# Patient Record
Sex: Male | Born: 1945
Health system: Southern US, Community
[De-identification: ages and names within clinical notes are randomized; demographics above are authoritative.]

## PROBLEM LIST (undated history)

## (undated) DIAGNOSIS — L570 Actinic keratosis: Secondary | ICD-10-CM

## (undated) DIAGNOSIS — M1990 Unspecified osteoarthritis, unspecified site: Secondary | ICD-10-CM

## (undated) DIAGNOSIS — E785 Hyperlipidemia, unspecified: Secondary | ICD-10-CM

## (undated) DIAGNOSIS — I2510 Atherosclerotic heart disease of native coronary artery without angina pectoris: Secondary | ICD-10-CM

## (undated) DIAGNOSIS — Z86018 Personal history of other benign neoplasm: Secondary | ICD-10-CM

## (undated) DIAGNOSIS — N401 Enlarged prostate with lower urinary tract symptoms: Secondary | ICD-10-CM

## (undated) DIAGNOSIS — I10 Essential (primary) hypertension: Secondary | ICD-10-CM

## (undated) DIAGNOSIS — Z85828 Personal history of other malignant neoplasm of skin: Secondary | ICD-10-CM

## (undated) DIAGNOSIS — R972 Elevated prostate specific antigen [PSA]: Secondary | ICD-10-CM

## (undated) DIAGNOSIS — D649 Anemia, unspecified: Secondary | ICD-10-CM

## (undated) DIAGNOSIS — G4733 Obstructive sleep apnea (adult) (pediatric): Secondary | ICD-10-CM

## (undated) DIAGNOSIS — N411 Chronic prostatitis: Secondary | ICD-10-CM

## (undated) DIAGNOSIS — R7302 Impaired glucose tolerance (oral): Secondary | ICD-10-CM

## (undated) DIAGNOSIS — N529 Male erectile dysfunction, unspecified: Secondary | ICD-10-CM

## (undated) DIAGNOSIS — N138 Other obstructive and reflux uropathy: Secondary | ICD-10-CM

## (undated) DIAGNOSIS — IMO0001 Reserved for inherently not codable concepts without codable children: Secondary | ICD-10-CM

## (undated) DIAGNOSIS — G2581 Restless legs syndrome: Secondary | ICD-10-CM

## (undated) HISTORY — DX: Benign prostatic hyperplasia with lower urinary tract symptoms: N40.1

## (undated) HISTORY — PX: CARDIAC SURGERY: SHX584

## (undated) HISTORY — DX: Other obstructive and reflux uropathy: N13.8

## (undated) HISTORY — DX: Personal history of other benign neoplasm: Z86.018

## (undated) HISTORY — PX: INGUINAL HERNIA REPAIR: SUR1180

## (undated) HISTORY — DX: Personal history of other malignant neoplasm of skin: Z85.828

## (undated) HISTORY — DX: Elevated prostate specific antigen (PSA): R97.20

## (undated) HISTORY — DX: Atherosclerotic heart disease of native coronary artery without angina pectoris: I25.10

## (undated) HISTORY — DX: Obstructive sleep apnea (adult) (pediatric): G47.33

## (undated) HISTORY — DX: Impaired glucose tolerance (oral): R73.02

## (undated) HISTORY — PX: TRANSURETHRAL RESECTION OF PROSTATE: SHX73

## (undated) HISTORY — DX: Hyperlipidemia, unspecified: E78.5

## (undated) HISTORY — PX: ADENOIDECTOMY: SUR15

## (undated) HISTORY — DX: Reserved for inherently not codable concepts without codable children: IMO0001

## (undated) HISTORY — DX: Actinic keratosis: L57.0

## (undated) HISTORY — DX: Chronic prostatitis: N41.1

## (undated) HISTORY — DX: Restless legs syndrome: G25.81

## (undated) HISTORY — DX: Anemia, unspecified: D64.9

## (undated) HISTORY — DX: Male erectile dysfunction, unspecified: N52.9

---

## 2000-04-05 DIAGNOSIS — I252 Old myocardial infarction: Secondary | ICD-10-CM | POA: Insufficient documentation

## 2000-04-05 DIAGNOSIS — I219 Acute myocardial infarction, unspecified: Secondary | ICD-10-CM

## 2000-04-05 HISTORY — PX: CORONARY ARTERY BYPASS GRAFT: SHX141

## 2000-04-05 HISTORY — DX: Acute myocardial infarction, unspecified: I21.9

## 2000-11-03 DIAGNOSIS — N411 Chronic prostatitis: Secondary | ICD-10-CM

## 2000-11-03 HISTORY — DX: Chronic prostatitis: N41.1

## 2012-10-18 ENCOUNTER — Ambulatory Visit: Payer: Self-pay

## 2012-10-19 ENCOUNTER — Emergency Department: Payer: Self-pay | Admitting: Emergency Medicine

## 2012-10-19 LAB — URINALYSIS, COMPLETE
Bilirubin,UR: NEGATIVE
Glucose,UR: NEGATIVE mg/dL (ref 0–75)
Nitrite: POSITIVE
RBC,UR: 34 /HPF (ref 0–5)
Specific Gravity: 1.015 (ref 1.003–1.030)
WBC UR: 2 /HPF (ref 0–5)

## 2012-12-01 ENCOUNTER — Emergency Department: Payer: Self-pay | Admitting: Emergency Medicine

## 2012-12-01 LAB — COMPREHENSIVE METABOLIC PANEL
Alkaline Phosphatase: 89 U/L (ref 50–136)
Anion Gap: 10 (ref 7–16)
Bilirubin,Total: 0.4 mg/dL (ref 0.2–1.0)
Calcium, Total: 9.5 mg/dL (ref 8.5–10.1)
Chloride: 99 mmol/L (ref 98–107)
Co2: 25 mmol/L (ref 21–32)
EGFR (Non-African Amer.): 52 — ABNORMAL LOW
Glucose: 164 mg/dL — ABNORMAL HIGH (ref 65–99)
Osmolality: 272 (ref 275–301)
SGOT(AST): 28 U/L (ref 15–37)
Sodium: 134 mmol/L — ABNORMAL LOW (ref 136–145)
Total Protein: 8.1 g/dL (ref 6.4–8.2)

## 2012-12-01 LAB — CBC
MCH: 32.1 pg (ref 26.0–34.0)
Platelet: 203 10*3/uL (ref 150–440)
RDW: 12.6 % (ref 11.5–14.5)

## 2012-12-01 LAB — LIPASE, BLOOD: Lipase: 78 U/L (ref 73–393)

## 2013-04-20 DIAGNOSIS — N400 Enlarged prostate without lower urinary tract symptoms: Secondary | ICD-10-CM | POA: Diagnosis not present

## 2013-04-25 DIAGNOSIS — N400 Enlarged prostate without lower urinary tract symptoms: Secondary | ICD-10-CM | POA: Diagnosis not present

## 2013-05-03 DIAGNOSIS — D239 Other benign neoplasm of skin, unspecified: Secondary | ICD-10-CM | POA: Diagnosis not present

## 2013-05-03 DIAGNOSIS — L570 Actinic keratosis: Secondary | ICD-10-CM | POA: Diagnosis not present

## 2013-05-08 DIAGNOSIS — L570 Actinic keratosis: Secondary | ICD-10-CM | POA: Diagnosis not present

## 2013-05-10 DIAGNOSIS — IMO0002 Reserved for concepts with insufficient information to code with codable children: Secondary | ICD-10-CM | POA: Diagnosis not present

## 2013-05-10 DIAGNOSIS — M545 Low back pain, unspecified: Secondary | ICD-10-CM | POA: Diagnosis not present

## 2013-05-10 DIAGNOSIS — M1710 Unilateral primary osteoarthritis, unspecified knee: Secondary | ICD-10-CM | POA: Diagnosis not present

## 2013-05-30 DIAGNOSIS — H02839 Dermatochalasis of unspecified eye, unspecified eyelid: Secondary | ICD-10-CM | POA: Diagnosis not present

## 2013-05-30 DIAGNOSIS — H2510 Age-related nuclear cataract, unspecified eye: Secondary | ICD-10-CM | POA: Diagnosis not present

## 2013-05-30 DIAGNOSIS — H539 Unspecified visual disturbance: Secondary | ICD-10-CM | POA: Diagnosis not present

## 2013-05-30 DIAGNOSIS — H43399 Other vitreous opacities, unspecified eye: Secondary | ICD-10-CM | POA: Diagnosis not present

## 2013-06-27 DIAGNOSIS — I10 Essential (primary) hypertension: Secondary | ICD-10-CM | POA: Diagnosis not present

## 2013-06-27 DIAGNOSIS — E039 Hypothyroidism, unspecified: Secondary | ICD-10-CM | POA: Diagnosis not present

## 2013-06-27 DIAGNOSIS — E785 Hyperlipidemia, unspecified: Secondary | ICD-10-CM | POA: Diagnosis not present

## 2013-07-04 DIAGNOSIS — I25810 Atherosclerosis of coronary artery bypass graft(s) without angina pectoris: Secondary | ICD-10-CM | POA: Diagnosis not present

## 2013-07-04 DIAGNOSIS — N401 Enlarged prostate with lower urinary tract symptoms: Secondary | ICD-10-CM | POA: Diagnosis not present

## 2013-07-04 DIAGNOSIS — I209 Angina pectoris, unspecified: Secondary | ICD-10-CM | POA: Diagnosis not present

## 2013-07-25 DIAGNOSIS — Z23 Encounter for immunization: Secondary | ICD-10-CM | POA: Diagnosis not present

## 2013-09-07 DIAGNOSIS — N400 Enlarged prostate without lower urinary tract symptoms: Secondary | ICD-10-CM | POA: Diagnosis not present

## 2013-09-12 DIAGNOSIS — N400 Enlarged prostate without lower urinary tract symptoms: Secondary | ICD-10-CM | POA: Diagnosis not present

## 2013-12-04 DIAGNOSIS — D649 Anemia, unspecified: Secondary | ICD-10-CM | POA: Diagnosis not present

## 2013-12-04 DIAGNOSIS — R972 Elevated prostate specific antigen [PSA]: Secondary | ICD-10-CM | POA: Diagnosis not present

## 2013-12-04 DIAGNOSIS — E785 Hyperlipidemia, unspecified: Secondary | ICD-10-CM | POA: Diagnosis not present

## 2013-12-07 DIAGNOSIS — J45909 Unspecified asthma, uncomplicated: Secondary | ICD-10-CM | POA: Diagnosis not present

## 2013-12-07 DIAGNOSIS — I2510 Atherosclerotic heart disease of native coronary artery without angina pectoris: Secondary | ICD-10-CM | POA: Diagnosis not present

## 2013-12-07 DIAGNOSIS — R339 Retention of urine, unspecified: Secondary | ICD-10-CM | POA: Diagnosis not present

## 2013-12-07 DIAGNOSIS — N401 Enlarged prostate with lower urinary tract symptoms: Secondary | ICD-10-CM | POA: Diagnosis not present

## 2013-12-07 DIAGNOSIS — N138 Other obstructive and reflux uropathy: Secondary | ICD-10-CM | POA: Diagnosis not present

## 2014-01-16 DIAGNOSIS — Z23 Encounter for immunization: Secondary | ICD-10-CM | POA: Diagnosis not present

## 2014-01-24 DIAGNOSIS — I519 Heart disease, unspecified: Secondary | ICD-10-CM | POA: Diagnosis not present

## 2014-01-24 DIAGNOSIS — I209 Angina pectoris, unspecified: Secondary | ICD-10-CM | POA: Diagnosis not present

## 2014-01-31 DIAGNOSIS — I208 Other forms of angina pectoris: Secondary | ICD-10-CM | POA: Diagnosis not present

## 2014-03-19 DIAGNOSIS — I2510 Atherosclerotic heart disease of native coronary artery without angina pectoris: Secondary | ICD-10-CM | POA: Diagnosis not present

## 2014-03-19 DIAGNOSIS — N401 Enlarged prostate with lower urinary tract symptoms: Secondary | ICD-10-CM | POA: Diagnosis not present

## 2014-03-19 DIAGNOSIS — I252 Old myocardial infarction: Secondary | ICD-10-CM | POA: Diagnosis not present

## 2014-03-19 DIAGNOSIS — R972 Elevated prostate specific antigen [PSA]: Secondary | ICD-10-CM | POA: Diagnosis not present

## 2014-03-19 DIAGNOSIS — N138 Other obstructive and reflux uropathy: Secondary | ICD-10-CM

## 2014-03-19 DIAGNOSIS — N529 Male erectile dysfunction, unspecified: Secondary | ICD-10-CM

## 2014-03-19 HISTORY — DX: Male erectile dysfunction, unspecified: N52.9

## 2014-03-19 HISTORY — DX: Other obstructive and reflux uropathy: N13.8

## 2014-03-19 HISTORY — DX: Other obstructive and reflux uropathy: N40.1

## 2014-03-19 HISTORY — DX: Elevated prostate specific antigen (PSA): R97.20

## 2014-05-22 DIAGNOSIS — Z1283 Encounter for screening for malignant neoplasm of skin: Secondary | ICD-10-CM | POA: Diagnosis not present

## 2014-05-22 DIAGNOSIS — D1800 Hemangioma unspecified site: Secondary | ICD-10-CM | POA: Diagnosis not present

## 2014-05-22 DIAGNOSIS — L570 Actinic keratosis: Secondary | ICD-10-CM | POA: Diagnosis not present

## 2014-05-22 DIAGNOSIS — L821 Other seborrheic keratosis: Secondary | ICD-10-CM | POA: Diagnosis not present

## 2014-05-22 DIAGNOSIS — L578 Other skin changes due to chronic exposure to nonionizing radiation: Secondary | ICD-10-CM | POA: Diagnosis not present

## 2014-05-22 DIAGNOSIS — L814 Other melanin hyperpigmentation: Secondary | ICD-10-CM | POA: Diagnosis not present

## 2014-05-22 DIAGNOSIS — D229 Melanocytic nevi, unspecified: Secondary | ICD-10-CM | POA: Diagnosis not present

## 2014-05-24 DIAGNOSIS — H269 Unspecified cataract: Secondary | ICD-10-CM | POA: Diagnosis not present

## 2014-08-06 DIAGNOSIS — I208 Other forms of angina pectoris: Secondary | ICD-10-CM | POA: Diagnosis not present

## 2014-08-06 DIAGNOSIS — I25701 Atherosclerosis of coronary artery bypass graft(s), unspecified, with angina pectoris with documented spasm: Secondary | ICD-10-CM | POA: Diagnosis not present

## 2014-08-06 DIAGNOSIS — E785 Hyperlipidemia, unspecified: Secondary | ICD-10-CM | POA: Diagnosis not present

## 2014-10-03 DIAGNOSIS — K644 Residual hemorrhoidal skin tags: Secondary | ICD-10-CM | POA: Diagnosis not present

## 2014-10-03 DIAGNOSIS — K648 Other hemorrhoids: Secondary | ICD-10-CM | POA: Diagnosis not present

## 2014-10-03 DIAGNOSIS — Z86010 Personal history of colonic polyps: Secondary | ICD-10-CM | POA: Diagnosis not present

## 2014-10-03 DIAGNOSIS — K5730 Diverticulosis of large intestine without perforation or abscess without bleeding: Secondary | ICD-10-CM | POA: Diagnosis not present

## 2014-11-20 DIAGNOSIS — L820 Inflamed seborrheic keratosis: Secondary | ICD-10-CM | POA: Diagnosis not present

## 2014-11-20 DIAGNOSIS — L578 Other skin changes due to chronic exposure to nonionizing radiation: Secondary | ICD-10-CM | POA: Diagnosis not present

## 2014-11-20 DIAGNOSIS — L570 Actinic keratosis: Secondary | ICD-10-CM | POA: Diagnosis not present

## 2014-12-24 DIAGNOSIS — Z125 Encounter for screening for malignant neoplasm of prostate: Secondary | ICD-10-CM | POA: Diagnosis not present

## 2014-12-24 DIAGNOSIS — E784 Other hyperlipidemia: Secondary | ICD-10-CM | POA: Diagnosis not present

## 2014-12-24 DIAGNOSIS — I10 Essential (primary) hypertension: Secondary | ICD-10-CM | POA: Diagnosis not present

## 2014-12-26 ENCOUNTER — Other Ambulatory Visit: Payer: Self-pay | Admitting: Internal Medicine

## 2014-12-26 ENCOUNTER — Ambulatory Visit
Admission: RE | Admit: 2014-12-26 | Discharge: 2014-12-26 | Disposition: A | Discharge status: Home / Self Care | Payer: Medicare Other | Source: Ambulatory Visit | Attending: Internal Medicine | Admitting: Internal Medicine

## 2014-12-26 ENCOUNTER — Ambulatory Visit
Admission: RE | Admit: 2014-12-26 | Discharge: 2014-12-26 | Disposition: A | Discharge status: Home / Self Care | Payer: Medicare Other | Source: Ambulatory Visit | Attending: Cardiology | Admitting: Cardiology

## 2014-12-26 DIAGNOSIS — R05 Cough: Secondary | ICD-10-CM | POA: Insufficient documentation

## 2014-12-26 DIAGNOSIS — Z0000 Encounter for general adult medical examination without abnormal findings: Secondary | ICD-10-CM | POA: Diagnosis not present

## 2014-12-26 DIAGNOSIS — R059 Cough, unspecified: Secondary | ICD-10-CM

## 2015-01-10 DIAGNOSIS — Z23 Encounter for immunization: Secondary | ICD-10-CM | POA: Diagnosis not present

## 2015-02-12 DIAGNOSIS — I208 Other forms of angina pectoris: Secondary | ICD-10-CM | POA: Diagnosis not present

## 2015-02-12 DIAGNOSIS — I25701 Atherosclerosis of coronary artery bypass graft(s), unspecified, with angina pectoris with documented spasm: Secondary | ICD-10-CM | POA: Diagnosis not present

## 2015-02-12 DIAGNOSIS — E785 Hyperlipidemia, unspecified: Secondary | ICD-10-CM | POA: Diagnosis not present

## 2015-02-25 DIAGNOSIS — I208 Other forms of angina pectoris: Secondary | ICD-10-CM | POA: Diagnosis not present

## 2015-03-25 DIAGNOSIS — R972 Elevated prostate specific antigen [PSA]: Secondary | ICD-10-CM | POA: Diagnosis not present

## 2015-03-25 DIAGNOSIS — N401 Enlarged prostate with lower urinary tract symptoms: Secondary | ICD-10-CM | POA: Diagnosis not present

## 2015-03-25 DIAGNOSIS — Z6829 Body mass index (BMI) 29.0-29.9, adult: Secondary | ICD-10-CM | POA: Diagnosis not present

## 2015-03-25 DIAGNOSIS — N138 Other obstructive and reflux uropathy: Secondary | ICD-10-CM | POA: Diagnosis not present

## 2015-05-28 DIAGNOSIS — D485 Neoplasm of uncertain behavior of skin: Secondary | ICD-10-CM | POA: Diagnosis not present

## 2015-05-28 DIAGNOSIS — D179 Benign lipomatous neoplasm, unspecified: Secondary | ICD-10-CM | POA: Diagnosis not present

## 2015-05-28 DIAGNOSIS — D229 Melanocytic nevi, unspecified: Secondary | ICD-10-CM | POA: Diagnosis not present

## 2015-05-28 DIAGNOSIS — L570 Actinic keratosis: Secondary | ICD-10-CM | POA: Diagnosis not present

## 2015-05-28 DIAGNOSIS — D1800 Hemangioma unspecified site: Secondary | ICD-10-CM | POA: Diagnosis not present

## 2015-05-28 DIAGNOSIS — L812 Freckles: Secondary | ICD-10-CM | POA: Diagnosis not present

## 2015-05-28 DIAGNOSIS — C44311 Basal cell carcinoma of skin of nose: Secondary | ICD-10-CM | POA: Diagnosis not present

## 2015-05-28 DIAGNOSIS — Z1283 Encounter for screening for malignant neoplasm of skin: Secondary | ICD-10-CM | POA: Diagnosis not present

## 2015-05-28 DIAGNOSIS — L578 Other skin changes due to chronic exposure to nonionizing radiation: Secondary | ICD-10-CM | POA: Diagnosis not present

## 2015-05-28 DIAGNOSIS — L820 Inflamed seborrheic keratosis: Secondary | ICD-10-CM | POA: Diagnosis not present

## 2015-05-28 DIAGNOSIS — L905 Scar conditions and fibrosis of skin: Secondary | ICD-10-CM | POA: Diagnosis not present

## 2015-05-29 DIAGNOSIS — H2513 Age-related nuclear cataract, bilateral: Secondary | ICD-10-CM | POA: Diagnosis not present

## 2015-07-01 DIAGNOSIS — R972 Elevated prostate specific antigen [PSA]: Secondary | ICD-10-CM | POA: Diagnosis not present

## 2015-07-02 DIAGNOSIS — L570 Actinic keratosis: Secondary | ICD-10-CM | POA: Diagnosis not present

## 2015-07-02 DIAGNOSIS — Z85828 Personal history of other malignant neoplasm of skin: Secondary | ICD-10-CM

## 2015-07-02 DIAGNOSIS — D170 Benign lipomatous neoplasm of skin and subcutaneous tissue of head, face and neck: Secondary | ICD-10-CM | POA: Diagnosis not present

## 2015-07-02 DIAGNOSIS — C44311 Basal cell carcinoma of skin of nose: Secondary | ICD-10-CM | POA: Diagnosis not present

## 2015-07-02 HISTORY — DX: Personal history of other malignant neoplasm of skin: Z85.828

## 2015-08-26 DIAGNOSIS — E785 Hyperlipidemia, unspecified: Secondary | ICD-10-CM | POA: Diagnosis not present

## 2015-08-26 DIAGNOSIS — I208 Other forms of angina pectoris: Secondary | ICD-10-CM | POA: Diagnosis not present

## 2015-08-26 DIAGNOSIS — I25701 Atherosclerosis of coronary artery bypass graft(s), unspecified, with angina pectoris with documented spasm: Secondary | ICD-10-CM | POA: Diagnosis not present

## 2015-10-16 DIAGNOSIS — L578 Other skin changes due to chronic exposure to nonionizing radiation: Secondary | ICD-10-CM | POA: Diagnosis not present

## 2015-10-16 DIAGNOSIS — Z85828 Personal history of other malignant neoplasm of skin: Secondary | ICD-10-CM | POA: Diagnosis not present

## 2015-10-16 DIAGNOSIS — L570 Actinic keratosis: Secondary | ICD-10-CM | POA: Diagnosis not present

## 2015-12-02 DIAGNOSIS — H40003 Preglaucoma, unspecified, bilateral: Secondary | ICD-10-CM | POA: Diagnosis not present

## 2015-12-17 DIAGNOSIS — L570 Actinic keratosis: Secondary | ICD-10-CM | POA: Diagnosis not present

## 2015-12-17 DIAGNOSIS — Z1283 Encounter for screening for malignant neoplasm of skin: Secondary | ICD-10-CM | POA: Diagnosis not present

## 2015-12-17 DIAGNOSIS — D229 Melanocytic nevi, unspecified: Secondary | ICD-10-CM | POA: Diagnosis not present

## 2015-12-17 DIAGNOSIS — D1800 Hemangioma unspecified site: Secondary | ICD-10-CM | POA: Diagnosis not present

## 2015-12-17 DIAGNOSIS — L812 Freckles: Secondary | ICD-10-CM | POA: Diagnosis not present

## 2015-12-17 DIAGNOSIS — Z85828 Personal history of other malignant neoplasm of skin: Secondary | ICD-10-CM | POA: Diagnosis not present

## 2015-12-17 DIAGNOSIS — L820 Inflamed seborrheic keratosis: Secondary | ICD-10-CM | POA: Diagnosis not present

## 2015-12-17 DIAGNOSIS — L578 Other skin changes due to chronic exposure to nonionizing radiation: Secondary | ICD-10-CM | POA: Diagnosis not present

## 2015-12-30 DIAGNOSIS — R5381 Other malaise: Secondary | ICD-10-CM | POA: Diagnosis not present

## 2015-12-30 DIAGNOSIS — E784 Other hyperlipidemia: Secondary | ICD-10-CM | POA: Diagnosis not present

## 2015-12-30 DIAGNOSIS — I10 Essential (primary) hypertension: Secondary | ICD-10-CM | POA: Diagnosis not present

## 2016-01-01 DIAGNOSIS — Z23 Encounter for immunization: Secondary | ICD-10-CM | POA: Diagnosis not present

## 2016-01-01 DIAGNOSIS — Z0000 Encounter for general adult medical examination without abnormal findings: Secondary | ICD-10-CM | POA: Diagnosis not present

## 2016-01-14 ENCOUNTER — Other Ambulatory Visit: Payer: Self-pay | Admitting: Internal Medicine

## 2016-01-14 ENCOUNTER — Ambulatory Visit
Admission: RE | Admit: 2016-01-14 | Discharge: 2016-01-14 | Disposition: A | Discharge status: Home / Self Care | Payer: Medicare Other | Source: Ambulatory Visit | Attending: Internal Medicine | Admitting: Internal Medicine

## 2016-01-14 ENCOUNTER — Ambulatory Visit
Admission: RE | Admit: 2016-01-14 | Discharge: 2016-01-14 | Disposition: A | Discharge status: Home / Self Care | Payer: Medicare Other | Source: Ambulatory Visit | Attending: Cardiology | Admitting: Cardiology

## 2016-01-14 DIAGNOSIS — M79661 Pain in right lower leg: Secondary | ICD-10-CM | POA: Diagnosis not present

## 2016-01-14 DIAGNOSIS — I82409 Acute embolism and thrombosis of unspecified deep veins of unspecified lower extremity: Secondary | ICD-10-CM | POA: Diagnosis not present

## 2016-01-14 DIAGNOSIS — M79604 Pain in right leg: Secondary | ICD-10-CM | POA: Diagnosis not present

## 2016-01-14 DIAGNOSIS — L03115 Cellulitis of right lower limb: Secondary | ICD-10-CM | POA: Diagnosis not present

## 2016-01-14 DIAGNOSIS — M79605 Pain in left leg: Secondary | ICD-10-CM

## 2016-01-14 DIAGNOSIS — M7989 Other specified soft tissue disorders: Secondary | ICD-10-CM | POA: Diagnosis not present

## 2016-01-14 DIAGNOSIS — R609 Edema, unspecified: Secondary | ICD-10-CM

## 2016-01-14 DIAGNOSIS — R5381 Other malaise: Secondary | ICD-10-CM | POA: Diagnosis not present

## 2016-01-14 DIAGNOSIS — M79662 Pain in left lower leg: Secondary | ICD-10-CM | POA: Diagnosis not present

## 2016-01-14 DIAGNOSIS — R6883 Chills (without fever): Secondary | ICD-10-CM | POA: Diagnosis not present

## 2016-01-15 ENCOUNTER — Encounter: Payer: Self-pay | Admitting: Emergency Medicine

## 2016-01-15 ENCOUNTER — Emergency Department
Admission: EM | Admit: 2016-01-15 | Discharge: 2016-01-15 | Disposition: A | Discharge status: Home / Self Care | Payer: Medicare Other | Attending: Emergency Medicine | Admitting: Emergency Medicine

## 2016-01-15 DIAGNOSIS — E785 Hyperlipidemia, unspecified: Secondary | ICD-10-CM | POA: Diagnosis not present

## 2016-01-15 DIAGNOSIS — L03115 Cellulitis of right lower limb: Secondary | ICD-10-CM | POA: Diagnosis not present

## 2016-01-15 DIAGNOSIS — I2510 Atherosclerotic heart disease of native coronary artery without angina pectoris: Secondary | ICD-10-CM | POA: Diagnosis not present

## 2016-01-15 DIAGNOSIS — M79604 Pain in right leg: Secondary | ICD-10-CM | POA: Diagnosis present

## 2016-01-15 DIAGNOSIS — I25701 Atherosclerosis of coronary artery bypass graft(s), unspecified, with angina pectoris with documented spasm: Secondary | ICD-10-CM | POA: Diagnosis not present

## 2016-01-15 HISTORY — DX: Atherosclerotic heart disease of native coronary artery without angina pectoris: I25.10

## 2016-01-15 LAB — BASIC METABOLIC PANEL
Anion gap: 9 (ref 5–15)
BUN: 19 mg/dL (ref 6–20)
CO2: 26 mmol/L (ref 22–32)
CREATININE: 1.15 mg/dL (ref 0.61–1.24)
Calcium: 9.5 mg/dL (ref 8.9–10.3)
Chloride: 102 mmol/L (ref 101–111)
GFR calc non Af Amer: >60 mL/min (ref >60)
Glucose, Bld: 108 mg/dL — ABNORMAL HIGH (ref 65–99)
Potassium: 3.7 mmol/L (ref 3.5–5.1)
Sodium: 137 mmol/L (ref 135–145)

## 2016-01-15 LAB — CBC WITH DIFFERENTIAL/PLATELET
BASOS PCT: 0 %
Basophils Absolute: 0 10*3/uL (ref 0–0.1)
Eosinophils Absolute: 0.2 10*3/uL (ref 0–0.7)
Eosinophils Relative: 2 %
HEMATOCRIT: 40 % (ref 40.0–52.0)
HEMOGLOBIN: 14.2 g/dL (ref 13.0–18.0)
LYMPHS ABS: 1.2 10*3/uL (ref 1.0–3.6)
Lymphocytes Relative: 12 %
MCH: 33.3 pg (ref 26.0–34.0)
MCHC: 35.4 g/dL (ref 32.0–36.0)
MCV: 94 fL (ref 80.0–100.0)
MONOS PCT: 10 %
Monocytes Absolute: 1.1 10*3/uL — ABNORMAL HIGH (ref 0.2–1.0)
NEUTROS ABS: 7.7 10*3/uL — AB (ref 1.4–6.5)
NEUTROS PCT: 76 %
Platelets: 170 10*3/uL (ref 150–440)
RBC: 4.26 MIL/uL — ABNORMAL LOW (ref 4.40–5.90)
RDW: 13.1 % (ref 11.5–14.5)
WBC: 10.3 10*3/uL (ref 3.8–10.6)

## 2016-01-15 MED ORDER — DOXYCYCLINE HYCLATE 100 MG PO CAPS: 100.0000 mg | ORAL_CAPSULE | Freq: Two times a day (BID) | ORAL | 0 refills | Status: DC

## 2016-01-15 MED ORDER — VANCOMYCIN HCL IN DEXTROSE 1-5 GM/200ML-% IV SOLN
1000.0000 mg | INTRAVENOUS | Status: AC
  Administered 2016-01-15: 1000 mg via INTRAVENOUS
  Filled 2016-01-15: qty 200

## 2016-01-15 NOTE — ED Notes (Signed)
Pt discharged home after verbalizing understanding of discharge instructions; nad noted. 

## 2016-01-15 NOTE — ED Provider Notes (Signed)
[facility] Emergency Department Provider Note   ____________________________________________   First MD Initiated Contact with Patient 01/15/16 1518     (approximate)  I have reviewed the triage vital signs and the nursing notes.   HISTORY  Chief Complaint Leg Pain   HPI Bruce Gomez is a 70 y.o. male with a history of coronary artery disease was presenting with right lower extremity cellulitis. He says that he started developing fever this past Monday and had diffuse joint pain. He then started to develop right lower extremity erythema which she described as heaviness right leg. He saw his doctor, Dr. Rebecka Apley, yesterday who ordered a DVT study as well as an x-ray which were both read as negative. He was placed on Keflex was taken 5 total doses. He denies any fever today and denies any body aches. However, he says that his leg is now entirely erythematous and swollen.   Past Medical History:  Diagnosis Date  . Coronary artery disease     There are no active problems to display for this patient.   Past Surgical History:  Procedure Laterality Date  . CARDIAC SURGERY     cabg 2002  . TRANSURETHRAL RESECTION OF PROSTATE     2002    Prior to Admission medications   Not on File    Allergies Sulfa antibiotics and Benadryl [diphenhydramine hcl (sleep)]  No family history on file.  Social History Social History  Substance Use Topics  . Smoking status: Never Smoker  . Smokeless tobacco: Never Used  . Alcohol use No    Review of Systems Constitutional: as above Eyes: No visual changes. ENT: No sore throat. Cardiovascular: Denies chest pain. Respiratory: Denies shortness of breath. Gastrointestinal: No abdominal pain.  No nausea, no vomiting.  No diarrhea.  No constipation. Genitourinary: Negative for dysuria. Musculoskeletal: Negative for back pain. Skin: Negative for rash. Neurological: Negative for headaches, focal weakness or  numbness.  10-point ROS otherwise negative.  ____________________________________________   PHYSICAL EXAM:  VITAL SIGNS: ED Triage Vitals  Enc Vitals Group     BP 01/15/16 1352 117/73     Pulse Rate 01/15/16 1352 93     Resp 01/15/16 1352 18     Temp 01/15/16 1352 99.1 F (37.3 C)     Temp Source 01/15/16 1352 Oral     SpO2 01/15/16 1352 99 %     Weight 01/15/16 1353 172 lb (78 kg)     Height 01/15/16 1353 5\' 4"  (1.626 m)     Head Circumference --      Peak Flow --      Pain Score 01/15/16 1353 0     Pain Loc --      Pain Edu? --      Excl. in [place]? --     Constitutional: Alert and oriented. Well appearing and in no acute distress. Eyes: Conjunctivae are normal. PERRL. EOMI. Head: Atraumatic. Nose: No congestion/rhinnorhea. Mouth/Throat: Mucous membranes are moist.  Oropharynx non-erythematous. Neck: No stridor.   Cardiovascular: Normal rate, regular rhythm. Grossly normal heart sounds.  Good peripheral circulation. Respiratory: Normal respiratory effort.  No retractions. Lungs CTAB. Gastrointestinal: Soft and nontender. No distention. No abdominal bruits. No CVA tenderness. Musculoskeletal: Right lower extremity with edema from the ankle to the mid calf. There is also erythema without any induration or pus. Positive tenderness to palpation. No crepitus. Pain is not out of her [place] to the exam. The erythema extends to just above the knee on the right  side. There is also minimal right foot swelling. Dorsalis pedis pulses are present and equal bilaterally. On the right lower extremity there is a well-healed vein graft from the patient's remote cardiac bypass. No obvious abscess. No spot of fluctuance. No obvious nidus for the infection. Neurologic:  Normal speech and language. No gross focal neurologic deficits are appreciated. No gait instability. Skin:  Skin is warm, dry and intact. No rash noted. Psychiatric: Mood and affect are normal. Speech and behavior are  normal.  ____________________________________________   LABS (all labs ordered are listed, but only abnormal results are displayed)  Labs Reviewed  CBC WITH DIFFERENTIAL/PLATELET - Abnormal; Notable for the following:       Result Value   RBC 4.26 (*)    Neutro Abs 7.7 (*)    Monocytes Absolute 1.1 (*)    All other components within normal limits  BASIC METABOLIC PANEL - Abnormal; Notable for the following:    Glucose, Bld 108 (*)    All other components within normal limits   ____________________________________________  EKG   ____________________________________________  RADIOLOGY   ____________________________________________   PROCEDURES  Procedure(s) performed:   Procedures  Critical Care performed:   ____________________________________________   INITIAL IMPRESSION / ASSESSMENT AND PLAN / ED COURSE  Pertinent labs & imaging results that were available during my care of the patient were reviewed by me and considered in my medical decision making (see chart for details).  Patient with what appears to be a failure of outpatient antibiotics although he has only taken 5 doses the condition seems to be worsening. Will recheck labs as well as given a dose of IV vancomycin here in the emergency department.  Clinical Course   ----------------------------------------- 5:04 PM on 01/15/2016 -----------------------------------------  Patient with right lower extremity cellulitis which appears to be worsening on Keflex. However, he says that his fevers have now abated and his white blood cell count has improved from 12.9 and is now normal. I will add doxycycline on to his medication regimen. He received a dose of vancomycin here in the emergency department. He has a sulfa allergy which is why I am not discharging him with Bactrim. I explained to him and his wife that if he has worsening, continued, despite the doxycycline he would need to come back to the emergency  department for admission to the hospital. He has understanding of this plan and is willing to comply.  ____________________________________________   FINAL CLINICAL IMPRESSION(S) / ED DIAGNOSES  Right lower summary cellulitis    NEW MEDICATIONS STARTED DURING THIS VISIT:  New Prescriptions   No medications on file     Note:  This document was prepared using Dragon voice recognition software and may include unintentional dictation errors.    Orbie Pyo, MD 01/15/16 [phone]

## 2016-01-15 NOTE — ED Notes (Signed)
Patient presents to the ED with right leg pain, swelling, and redness for several days.  Patient's PCP sent him to the ED after redness seems to have gotten worse over the past couple days even though patient has been taking antibiotics.  Patient reports recent fever and feeling extremely tired earlier in the week.

## 2016-01-15 NOTE — ED Triage Notes (Addendum)
Patient is being treated for cellulitis on right lower leg and is taking Keflex, started antibiotics yesterday afternoon..  Dr. Lavera Guise sent patient to ED today for a second opinion of cellulitis.    Patient states on Sunday he felt tired.  Monday c/o joint pain, intermittent chills, fever 101.8 on Tuesday, which prompted patient to see Dr. Lavera Guise on Wednesday.  Patient had bloodwork and doppler US done and has reports with him.

## 2016-01-16 IMAGING — CR DG CHEST 2V
1 series · 2 of 2 positions shown · non-contrast
Comparison: None.

CLINICAL DATA: Productive cough.

EXAM:
CHEST  2 VIEW

[Series 1: left lateral · 0.14mm/px · 2 of 2 slices shown]
[im 1/2]
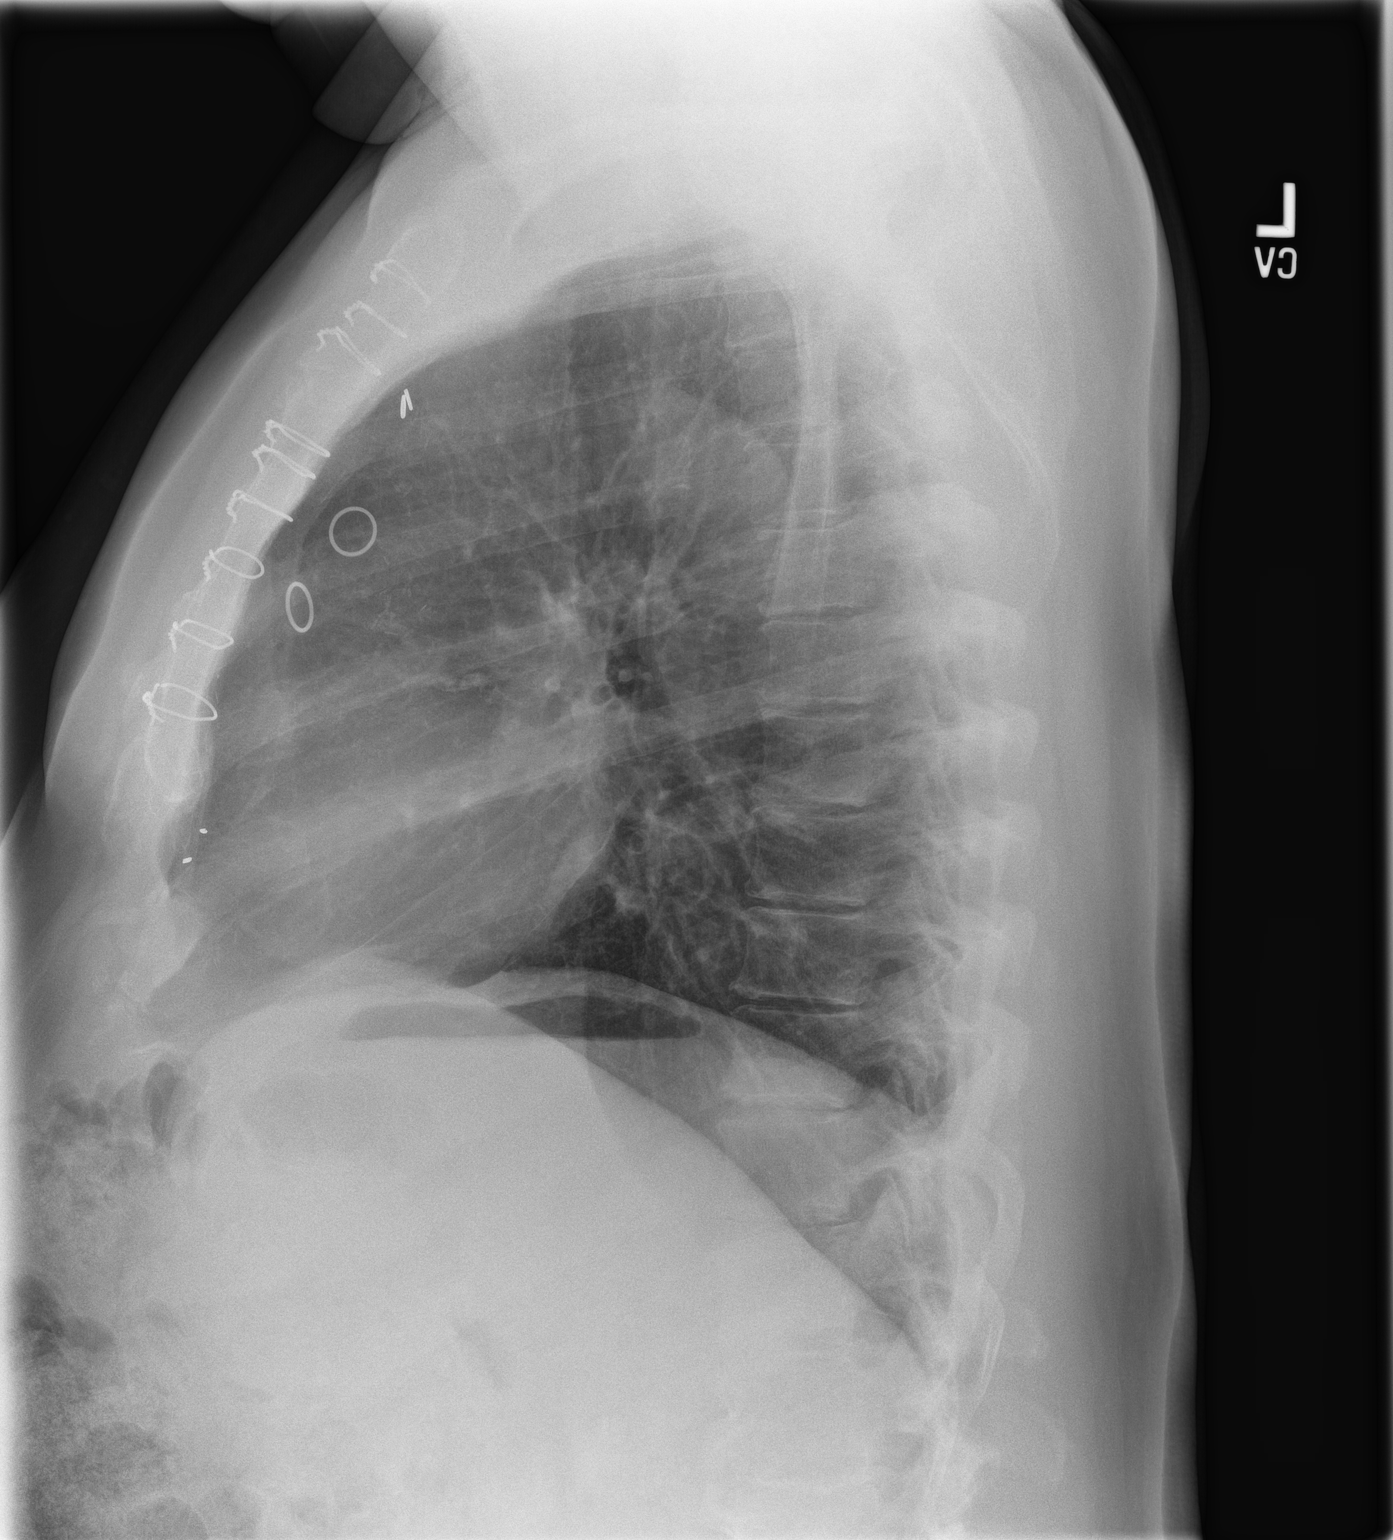
[im 2/2]
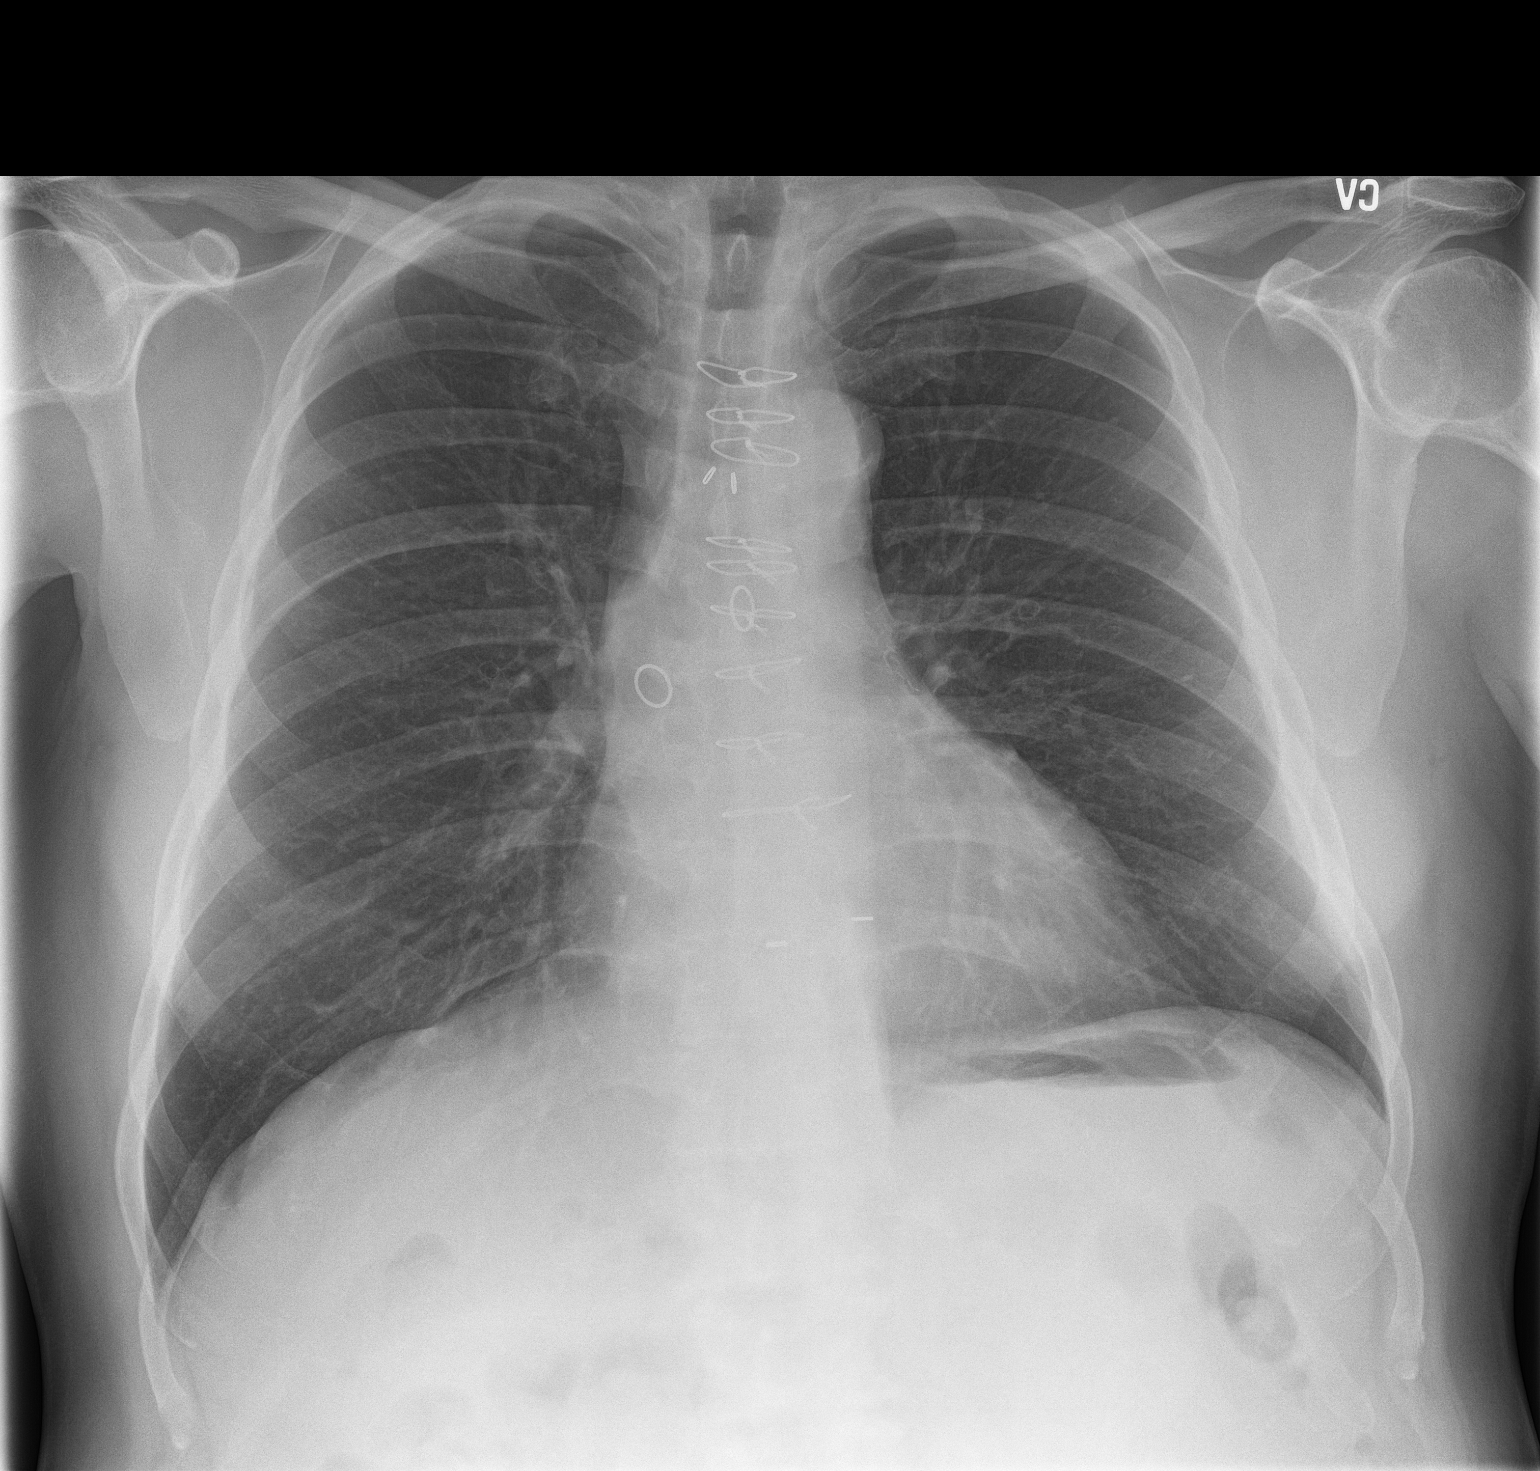

[2 of 2 positions shown; findings below may reference images not displayed]

FINDINGS: Mediastinum hilar structures normal. Lungs are clear. Heart size
normal. Prior CABG. No pleural effusion or pneumothorax. No acute
bony abnormality.
IMPRESSION: 1. Prior CABG.  Heart size normal.  No pulmonary venous congestion.
2. No acute infiltrate.

## 2016-01-23 DIAGNOSIS — L03115 Cellulitis of right lower limb: Secondary | ICD-10-CM | POA: Diagnosis not present

## 2016-01-23 DIAGNOSIS — I25701 Atherosclerosis of coronary artery bypass graft(s), unspecified, with angina pectoris with documented spasm: Secondary | ICD-10-CM | POA: Diagnosis not present

## 2016-01-23 DIAGNOSIS — E785 Hyperlipidemia, unspecified: Secondary | ICD-10-CM | POA: Diagnosis not present

## 2016-02-06 DIAGNOSIS — E785 Hyperlipidemia, unspecified: Secondary | ICD-10-CM | POA: Diagnosis not present

## 2016-02-06 DIAGNOSIS — I25701 Atherosclerosis of coronary artery bypass graft(s), unspecified, with angina pectoris with documented spasm: Secondary | ICD-10-CM | POA: Diagnosis not present

## 2016-02-06 DIAGNOSIS — I519 Heart disease, unspecified: Secondary | ICD-10-CM | POA: Diagnosis not present

## 2016-02-06 DIAGNOSIS — I208 Other forms of angina pectoris: Secondary | ICD-10-CM | POA: Diagnosis not present

## 2016-02-18 DIAGNOSIS — E785 Hyperlipidemia, unspecified: Secondary | ICD-10-CM | POA: Diagnosis not present

## 2016-02-18 DIAGNOSIS — R079 Chest pain, unspecified: Secondary | ICD-10-CM | POA: Diagnosis not present

## 2016-02-18 DIAGNOSIS — I519 Heart disease, unspecified: Secondary | ICD-10-CM | POA: Diagnosis not present

## 2016-02-18 DIAGNOSIS — I208 Other forms of angina pectoris: Secondary | ICD-10-CM | POA: Diagnosis not present

## 2016-02-18 DIAGNOSIS — I25701 Atherosclerosis of coronary artery bypass graft(s), unspecified, with angina pectoris with documented spasm: Secondary | ICD-10-CM | POA: Diagnosis not present

## 2016-03-10 DIAGNOSIS — I208 Other forms of angina pectoris: Secondary | ICD-10-CM | POA: Diagnosis not present

## 2016-03-10 DIAGNOSIS — I25701 Atherosclerosis of coronary artery bypass graft(s), unspecified, with angina pectoris with documented spasm: Secondary | ICD-10-CM | POA: Diagnosis not present

## 2016-03-10 DIAGNOSIS — R079 Chest pain, unspecified: Secondary | ICD-10-CM | POA: Diagnosis not present

## 2016-03-10 DIAGNOSIS — E785 Hyperlipidemia, unspecified: Secondary | ICD-10-CM | POA: Diagnosis not present

## 2016-04-15 DIAGNOSIS — Z6829 Body mass index (BMI) 29.0-29.9, adult: Secondary | ICD-10-CM | POA: Diagnosis not present

## 2016-04-15 DIAGNOSIS — R972 Elevated prostate specific antigen [PSA]: Secondary | ICD-10-CM | POA: Diagnosis not present

## 2016-04-15 DIAGNOSIS — N401 Enlarged prostate with lower urinary tract symptoms: Secondary | ICD-10-CM | POA: Diagnosis not present

## 2016-04-15 DIAGNOSIS — N138 Other obstructive and reflux uropathy: Secondary | ICD-10-CM | POA: Diagnosis not present

## 2016-05-06 DIAGNOSIS — Z23 Encounter for immunization: Secondary | ICD-10-CM | POA: Diagnosis not present

## 2016-06-16 DIAGNOSIS — Z85828 Personal history of other malignant neoplasm of skin: Secondary | ICD-10-CM | POA: Diagnosis not present

## 2016-06-16 DIAGNOSIS — L812 Freckles: Secondary | ICD-10-CM | POA: Diagnosis not present

## 2016-06-16 DIAGNOSIS — L821 Other seborrheic keratosis: Secondary | ICD-10-CM | POA: Diagnosis not present

## 2016-06-16 DIAGNOSIS — L570 Actinic keratosis: Secondary | ICD-10-CM | POA: Diagnosis not present

## 2016-06-16 DIAGNOSIS — L578 Other skin changes due to chronic exposure to nonionizing radiation: Secondary | ICD-10-CM | POA: Diagnosis not present

## 2016-09-21 DIAGNOSIS — E785 Hyperlipidemia, unspecified: Secondary | ICD-10-CM | POA: Diagnosis not present

## 2016-09-21 DIAGNOSIS — I25701 Atherosclerosis of coronary artery bypass graft(s), unspecified, with angina pectoris with documented spasm: Secondary | ICD-10-CM | POA: Diagnosis not present

## 2016-09-21 DIAGNOSIS — J302 Other seasonal allergic rhinitis: Secondary | ICD-10-CM | POA: Diagnosis not present

## 2016-09-21 DIAGNOSIS — I519 Heart disease, unspecified: Secondary | ICD-10-CM | POA: Diagnosis not present

## 2016-12-22 DIAGNOSIS — L578 Other skin changes due to chronic exposure to nonionizing radiation: Secondary | ICD-10-CM | POA: Diagnosis not present

## 2016-12-22 DIAGNOSIS — Z85828 Personal history of other malignant neoplasm of skin: Secondary | ICD-10-CM | POA: Diagnosis not present

## 2016-12-22 DIAGNOSIS — L812 Freckles: Secondary | ICD-10-CM | POA: Diagnosis not present

## 2016-12-22 DIAGNOSIS — D1800 Hemangioma unspecified site: Secondary | ICD-10-CM | POA: Diagnosis not present

## 2016-12-22 DIAGNOSIS — L821 Other seborrheic keratosis: Secondary | ICD-10-CM | POA: Diagnosis not present

## 2016-12-22 DIAGNOSIS — D229 Melanocytic nevi, unspecified: Secondary | ICD-10-CM | POA: Diagnosis not present

## 2016-12-22 DIAGNOSIS — L570 Actinic keratosis: Secondary | ICD-10-CM | POA: Diagnosis not present

## 2016-12-23 DIAGNOSIS — E784 Other hyperlipidemia: Secondary | ICD-10-CM | POA: Diagnosis not present

## 2016-12-23 DIAGNOSIS — I10 Essential (primary) hypertension: Secondary | ICD-10-CM | POA: Diagnosis not present

## 2016-12-23 DIAGNOSIS — R5381 Other malaise: Secondary | ICD-10-CM | POA: Diagnosis not present

## 2016-12-23 DIAGNOSIS — R6883 Chills (without fever): Secondary | ICD-10-CM | POA: Diagnosis not present

## 2017-01-06 DIAGNOSIS — Z23 Encounter for immunization: Secondary | ICD-10-CM | POA: Diagnosis not present

## 2017-01-11 DIAGNOSIS — Z0000 Encounter for general adult medical examination without abnormal findings: Secondary | ICD-10-CM | POA: Diagnosis not present

## 2017-01-25 DIAGNOSIS — E7849 Other hyperlipidemia: Secondary | ICD-10-CM | POA: Diagnosis not present

## 2017-01-25 DIAGNOSIS — R5381 Other malaise: Secondary | ICD-10-CM | POA: Diagnosis not present

## 2017-01-25 DIAGNOSIS — R6883 Chills (without fever): Secondary | ICD-10-CM | POA: Diagnosis not present

## 2017-01-25 DIAGNOSIS — I10 Essential (primary) hypertension: Secondary | ICD-10-CM | POA: Diagnosis not present

## 2017-02-03 IMAGING — CR DG TIBIA/FIBULA 2V*R*
1 series · 4 of 4 positions shown · non-contrast
Comparison: None.

CLINICAL DATA: Medial and lower right leg pain and swelling since
yesterday. No known injury.

EXAM:
RIGHT TIBIA AND FIBULA - 2 VIEW

[Series 1: dg tibia/fibula right · 0.14mm/px · 4 of 4 slices shown]
[im 1/4]
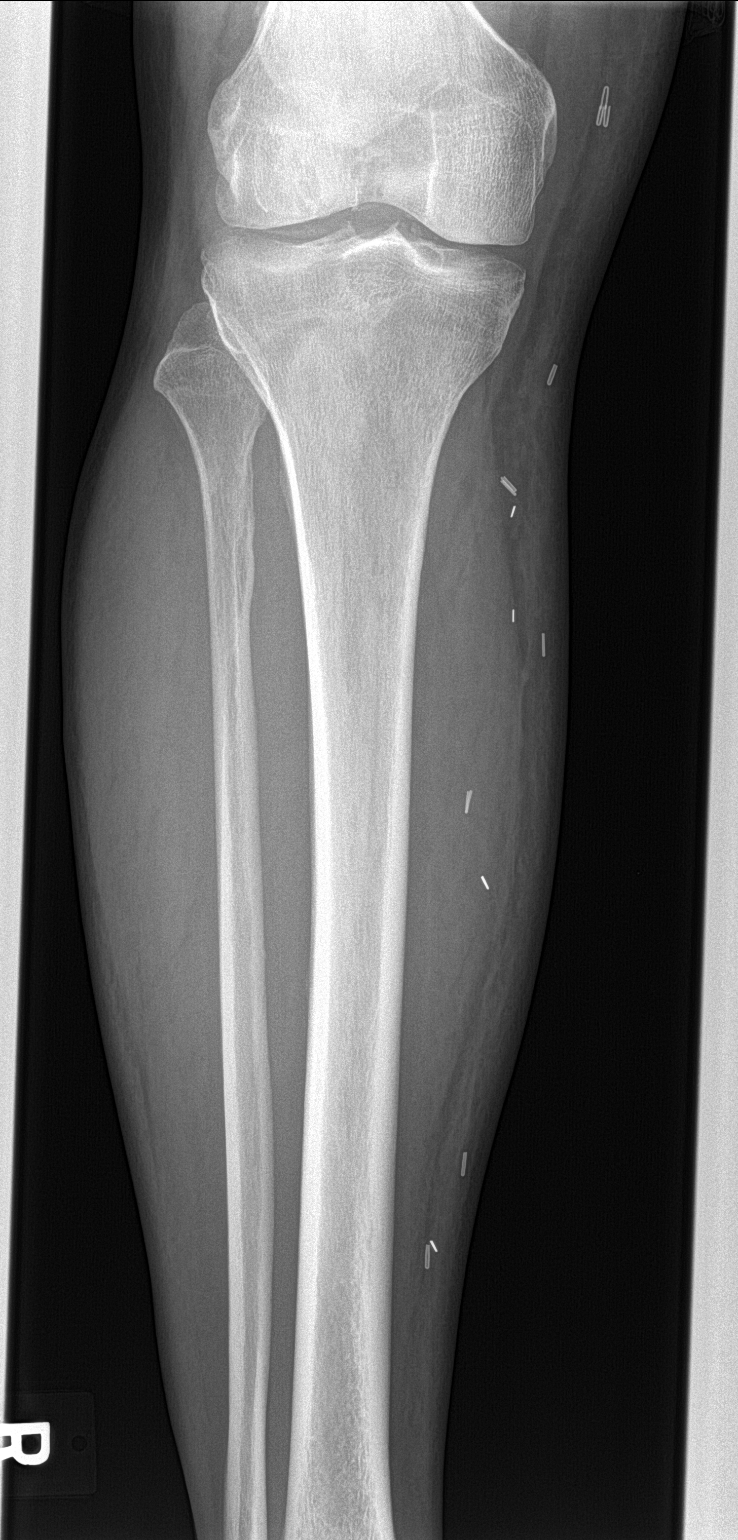
[im 2/4]
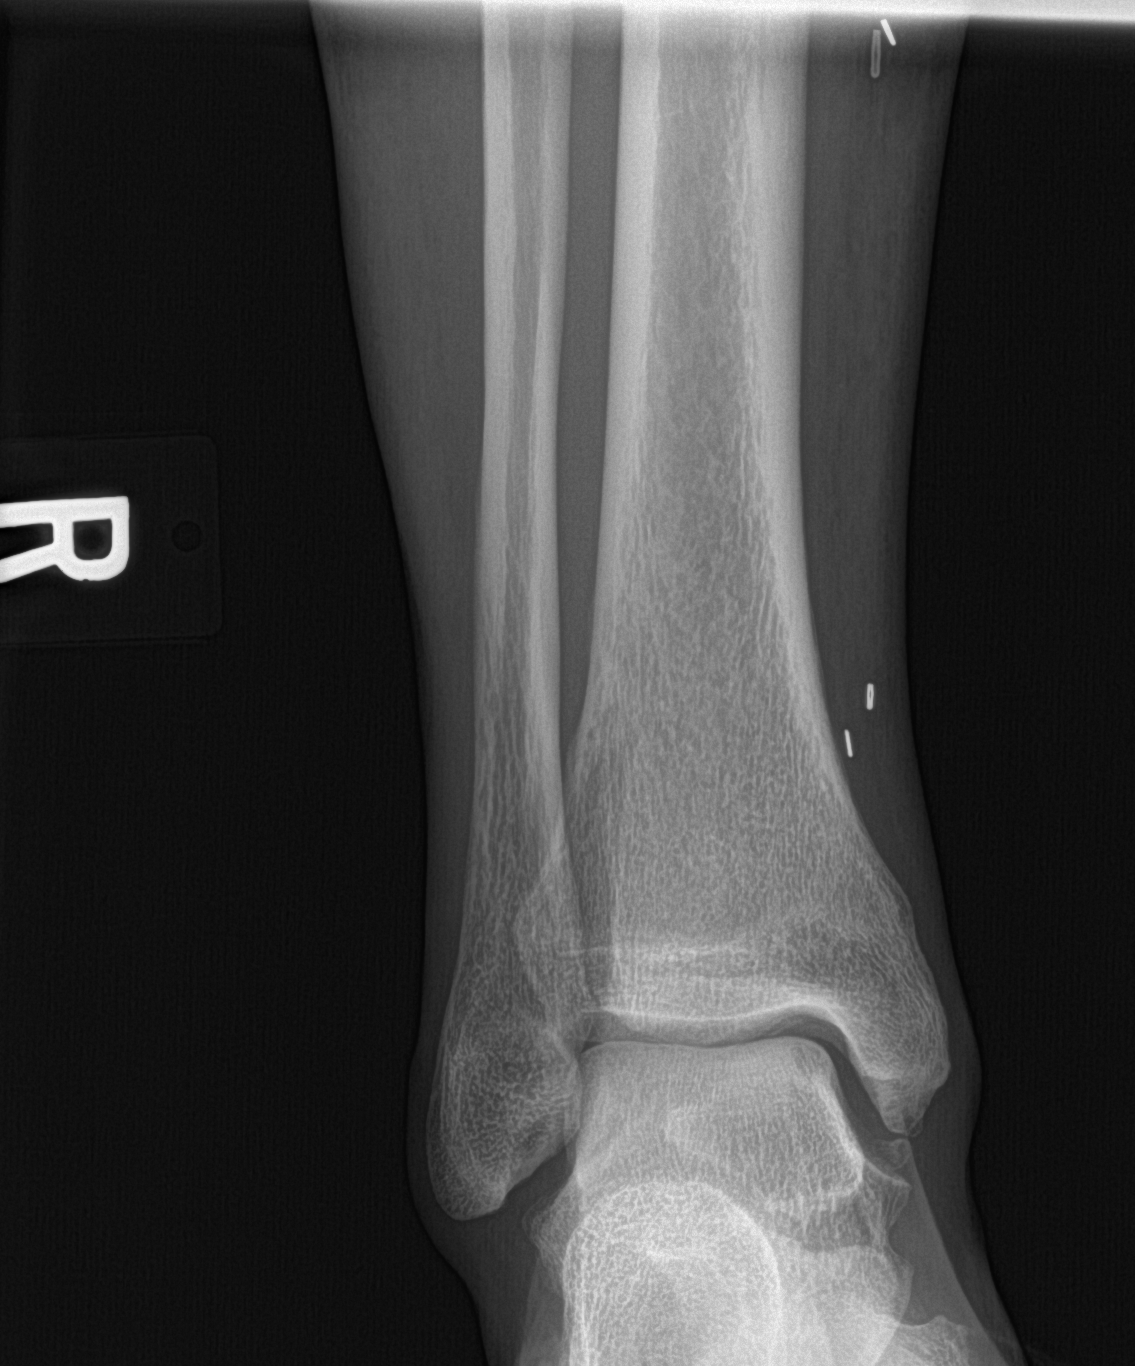
[im 3/4]
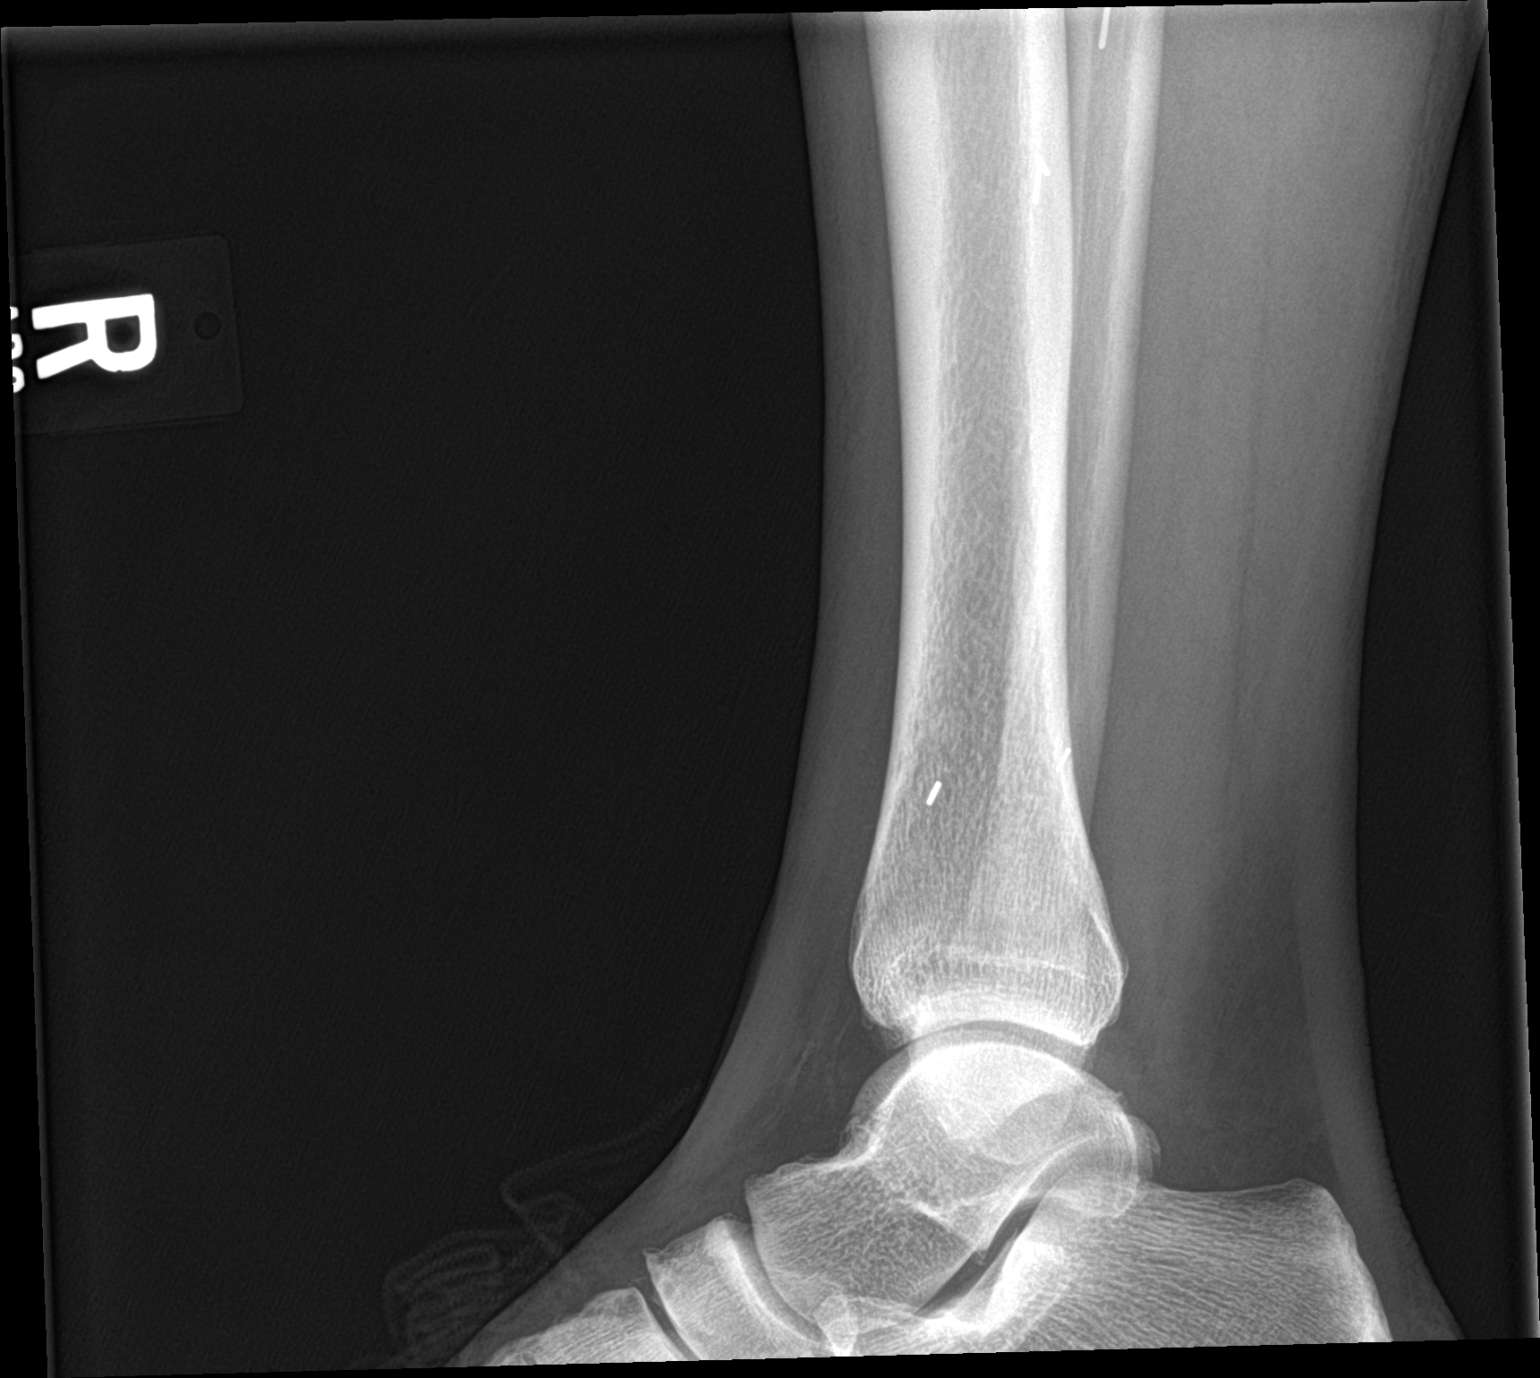
[im 4/4]
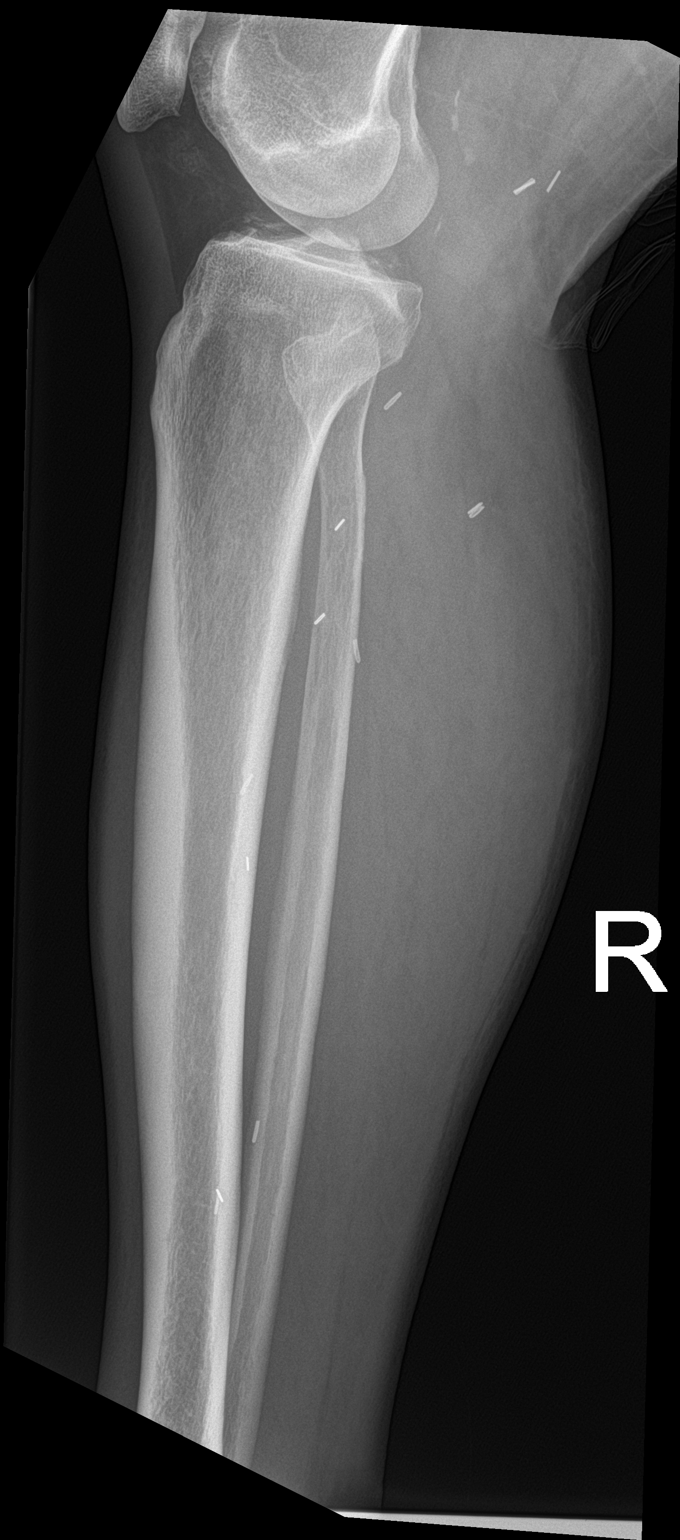

[4 of 4 positions shown; findings below may reference images not displayed]

FINDINGS: There is no evidence of fracture or other focal bone lesions.

Chondrocalcinosis and mild degenerative spurring at the knee.

Surgical clips, usually from saphenous harvesting.
IMPRESSION: No explanation for acute pain.

## 2017-02-23 DIAGNOSIS — I519 Heart disease, unspecified: Secondary | ICD-10-CM | POA: Diagnosis not present

## 2017-02-23 DIAGNOSIS — R079 Chest pain, unspecified: Secondary | ICD-10-CM | POA: Diagnosis not present

## 2017-02-23 DIAGNOSIS — I25701 Atherosclerosis of coronary artery bypass graft(s), unspecified, with angina pectoris with documented spasm: Secondary | ICD-10-CM | POA: Diagnosis not present

## 2017-02-23 DIAGNOSIS — E785 Hyperlipidemia, unspecified: Secondary | ICD-10-CM | POA: Diagnosis not present

## 2017-03-16 DIAGNOSIS — I519 Heart disease, unspecified: Secondary | ICD-10-CM | POA: Diagnosis not present

## 2017-03-16 DIAGNOSIS — I208 Other forms of angina pectoris: Secondary | ICD-10-CM | POA: Diagnosis not present

## 2017-03-16 DIAGNOSIS — E785 Hyperlipidemia, unspecified: Secondary | ICD-10-CM | POA: Diagnosis not present

## 2017-03-16 DIAGNOSIS — I2510 Atherosclerotic heart disease of native coronary artery without angina pectoris: Secondary | ICD-10-CM | POA: Diagnosis not present

## 2017-04-08 DIAGNOSIS — E785 Hyperlipidemia, unspecified: Secondary | ICD-10-CM | POA: Diagnosis not present

## 2017-04-08 DIAGNOSIS — N419 Inflammatory disease of prostate, unspecified: Secondary | ICD-10-CM | POA: Diagnosis not present

## 2017-04-08 DIAGNOSIS — I519 Heart disease, unspecified: Secondary | ICD-10-CM | POA: Diagnosis not present

## 2017-04-08 DIAGNOSIS — I208 Other forms of angina pectoris: Secondary | ICD-10-CM | POA: Diagnosis not present

## 2017-04-13 ENCOUNTER — Other Ambulatory Visit: Payer: Self-pay

## 2017-04-13 DIAGNOSIS — R972 Elevated prostate specific antigen [PSA]: Secondary | ICD-10-CM

## 2017-04-14 ENCOUNTER — Other Ambulatory Visit: Payer: Medicare Other

## 2017-04-14 DIAGNOSIS — R972 Elevated prostate specific antigen [PSA]: Secondary | ICD-10-CM | POA: Diagnosis not present

## 2017-04-15 LAB — PSA: PROSTATE SPECIFIC AG, SERUM: 10.7 ng/mL — AB (ref 0.0–4.0)

## 2017-04-20 ENCOUNTER — Ambulatory Visit: Payer: Medicare Other | Admitting: Urology

## 2017-04-21 ENCOUNTER — Encounter: Payer: Self-pay | Admitting: Urology

## 2017-04-21 ENCOUNTER — Ambulatory Visit (INDEPENDENT_AMBULATORY_CARE_PROVIDER_SITE_OTHER): Payer: Medicare Other | Admitting: Urology

## 2017-04-21 VITALS — BP 126/68 | HR 86 | Ht 64.0 in | Wt 168.0 lb

## 2017-04-21 DIAGNOSIS — G2581 Restless legs syndrome: Secondary | ICD-10-CM

## 2017-04-21 DIAGNOSIS — N401 Enlarged prostate with lower urinary tract symptoms: Secondary | ICD-10-CM

## 2017-04-21 DIAGNOSIS — I2510 Atherosclerotic heart disease of native coronary artery without angina pectoris: Secondary | ICD-10-CM | POA: Insufficient documentation

## 2017-04-21 DIAGNOSIS — R972 Elevated prostate specific antigen [PSA]: Secondary | ICD-10-CM

## 2017-04-21 DIAGNOSIS — N138 Other obstructive and reflux uropathy: Secondary | ICD-10-CM | POA: Diagnosis not present

## 2017-04-21 DIAGNOSIS — R7302 Impaired glucose tolerance (oral): Secondary | ICD-10-CM

## 2017-04-21 DIAGNOSIS — E785 Hyperlipidemia, unspecified: Secondary | ICD-10-CM | POA: Insufficient documentation

## 2017-04-21 DIAGNOSIS — G4733 Obstructive sleep apnea (adult) (pediatric): Secondary | ICD-10-CM

## 2017-04-21 DIAGNOSIS — IMO0001 Reserved for inherently not codable concepts without codable children: Secondary | ICD-10-CM | POA: Insufficient documentation

## 2017-04-21 HISTORY — DX: Hyperlipidemia, unspecified: E78.5

## 2017-04-21 HISTORY — DX: Restless legs syndrome: G25.81

## 2017-04-21 HISTORY — DX: Atherosclerotic heart disease of native coronary artery without angina pectoris: I25.10

## 2017-04-21 HISTORY — DX: Reserved for inherently not codable concepts without codable children: IMO0001

## 2017-04-21 HISTORY — DX: Impaired glucose tolerance (oral): R73.02

## 2017-04-21 HISTORY — DX: Obstructive sleep apnea (adult) (pediatric): G47.33

## 2017-04-21 MED ORDER — FINASTERIDE 5 MG PO TABS: 5.0000 mg | ORAL_TABLET | Freq: Every day | ORAL | 3 refills | Status: DC

## 2017-04-21 MED ORDER — SILDENAFIL CITRATE 20 MG PO TABS: ORAL_TABLET | ORAL | 1 refills | Status: DC

## 2017-04-21 NOTE — Progress Notes (Signed)
04/21/2017 9:43 AM   Bruce Gomez May 23, 1945 [id number]  Referring provider: Cletis Athens, MD [address] [place], [place] [address]  Chief Complaint  Patient presents with  . Elevated PSA    1 YEAR   Urologic problem list:  1.  Elevated PSA-previously followed in [place].  For prior prostate biopsies since the early 2000 with benign pathology and a baseline PSA between 11-13.  2.  BPH-urinary retention in 2014 and status post TURP by Dr. Robby Sermon with an incomplete resection.  He had resection of a large intravesical median lobe with 26 g resected.  3.  Erectile dysfunction.-On sildenafil  HPI: 72 year old male with the above urologic history.  I last saw him at [facility] in January 2018.  He has had stable lower urinary tract symptoms since his last visit however states that earlier this month he had increased frequency and nocturia mild suprapubic discomfort.  He saw Dr. Lavera Guise and had a urinalysis checked which was apparently normal.  He was treated with an empiric course of Levaquin with improvement in his symptoms.  He denies dysuria or gross hematuria.  He presently has no flank, abdominal, pelvic or scrotal pain. A PSA drawn last week was within baseline at 10.7 ng/dL.  He has requested a refill of sildenafil.   PM: Past Medical History:  Diagnosis Date  . Aortic sclerosis 04/21/2017   Overview:  Moderate Aortic Sclerosis, mild LVH,  mild Mitral valve thickening, Slight LAE on ECHO - started  on ACE  for  LVH  . BPH with obstruction/lower urinary tract symptoms 03/19/2014  . CAD (coronary artery disease) 04/21/2017   Overview:   silent MI - Inferior; s/p CABG x 3; Stress Echo  6/05; 8/08;  2/12  . Chronic prostatitis 11/03/2000  . Coronary artery disease   . Elevated PSA 03/19/2014  . Erectile dysfunction 03/19/2014  . Hyperlipidemia 04/21/2017  . Impaired glucose tolerance 04/21/2017  . OSA (obstructive sleep apnea) 04/21/2017   Overview:  rx with wt loss, side  sleeping  . RLS (restless legs syndrome) 04/21/2017    Surgical History: Past Surgical History:  Procedure Laterality Date  . ADENOIDECTOMY    . CARDIAC SURGERY     cabg 2002  . INGUINAL HERNIA REPAIR    . TRANSURETHRAL RESECTION OF PROSTATE     2014    Home Medications:  Allergies as of 04/21/2017      Reactions   Sulfa Antibiotics Other (See Comments)   Dizziness   Benadryl [diphenhydramine Hcl (sleep)] Anxiety      Medication List        Accurate as of 04/21/17  9:43 AM. Always use your most recent med list.          aspirin EC 81 MG tablet Take 81 mg by mouth.   atorvastatin 20 MG tablet Commonly known as:  LIPITOR 10 mg.   clonazePAM 0.5 MG tablet Commonly known as:  KLONOPIN Take 0.25 mg by mouth.   Coenzyme Q10 100 MG capsule Take 200 mg by mouth.   DOCOSAHEXAENOIC ACID PO Take by mouth.   fluticasone 50 MCG/ACT nasal spray Commonly known as:  FLONASE 1 spray by Each Nare route daily.   folic acid 122 MCG tablet Commonly known as:  FOLVITE Take by mouth.   ibuprofen 200 MG tablet Commonly known as:  ADVIL,MOTRIN Take 200 mg by mouth.   lisinopril 2.5 MG tablet Commonly known as:  PRINIVIL,ZESTRIL   Melatonin 3 MG Tabs Take by mouth.   NIASPAN  500 MG CR tablet Generic drug:  niacin   nitroGLYCERIN 0.4 MG SL tablet Commonly known as:  NITROSTAT Place 0.4 mg under the tongue.   pseudoephedrine 30 MG tablet Commonly known as:  SUDAFED Take by mouth.   sildenafil 20 MG tablet Commonly known as:  REVATIO 1-5 PO QDAY PRN, USE AS DIRECTED.   TOPROL XL 25 MG 24 hr tablet Generic drug:  metoprolol succinate   vitamin C 500 MG tablet Commonly known as:  ASCORBIC ACID Take 500 mg by mouth.   Vitamin D3 2000 units capsule Take by mouth.       Allergies:  Allergies  Allergen Reactions  . Sulfa Antibiotics Other (See Comments)    Dizziness   . Benadryl [Diphenhydramine Hcl (Sleep)] Anxiety    Family History: Family History    Problem Relation Age of Onset  . Prostate cancer Paternal Uncle   . Bladder Cancer Neg Hx   . Kidney cancer Neg Hx     Social History:  reports that  has never smoked. he has never used smokeless tobacco. He reports that he does not drink alcohol or use drugs.  ROS: UROLOGY Frequent Urination?: Yes Hard to postpone urination?: No Burning/pain with urination?: No Get up at night to urinate?: Yes Leakage of urine?: No Urine stream starts and stops?: Yes Trouble starting stream?: No Do you have to strain to urinate?: No Blood in urine?: No Urinary tract infection?: No Sexually transmitted disease?: No Injury to kidneys or bladder?: No Painful intercourse?: No Weak stream?: No Erection problems?: No Penile pain?: No  Gastrointestinal Nausea?: No Vomiting?: No Indigestion/heartburn?: No Diarrhea?: No Constipation?: No  Constitutional Fever: No Night sweats?: No Weight loss?: No Fatigue?: No  Skin Skin rash/lesions?: No Itching?: No  Eyes Blurred vision?: No Double vision?: No  Ears/Nose/Throat Sore throat?: No Sinus problems?: No  Hematologic/Lymphatic Swollen glands?: No Easy bruising?: No  Cardiovascular Leg swelling?: No Chest pain?: No  Respiratory Cough?: No Shortness of breath?: No  Endocrine Excessive thirst?: No  Musculoskeletal Back pain?: No Joint pain?: Yes  Neurological Headaches?: No Dizziness?: No  Psychologic Depression?: No Anxiety?: No  Physical Exam: BP 126/68   Pulse 86   Ht 5\' 4"  (1.626 m)   Wt 168 lb (76.2 kg)   BMI 28.84 kg/m   Constitutional:  Alert and oriented, No acute distress. HEENT: [place] AT, moist mucus membranes.  Trachea midline, no masses. Cardiovascular: No clubbing, cyanosis, or edema. Respiratory: Normal respiratory effort, no increased work of breathing. GI: Abdomen is soft, nontender, nondistended, no abdominal masses GU: No CVA tenderness.  Prostate 8, smooth without nodules0+ cc Skin: No  rashes, bruises or suspicious lesions. Lymph: No cervical or inguinal adenopathy. Neurologic: Grossly intact, no focal deficits, moving all 4 extremities. Psychiatric: Normal mood and affect.  Laboratory Data: Lab Results  Component Value Date   WBC 10.3 01/15/2016   HGB 14.2 01/15/2016   HCT 40.0 01/15/2016   MCV 94.0 01/15/2016   PLT 170 01/15/2016    Lab Results  Component Value Date   CREATININE 1.15 01/15/2016    Lab Results  Component Value Date   PSA1 10.7 (H) 04/14/2017    Urinalysis Lab Results  Component Value Date   APPEARANCEUR Clear 10/19/2012   LEUKOCYTESUR Negative 10/19/2012   PROTEINUR Negative 10/19/2012   GLUCOSEU Negative 10/19/2012   RBCU 34 /HPF 10/19/2012   BILIRUBINUR Negative 10/19/2012   NITRITE Positive 10/19/2012    Lab Results  Component Value Date   BACTERIA  1+ 10/19/2012    Assessment & Plan:   72 year old male with an elevated PSA, BPH and erectile dysfunction.  PSA is stable and DRE is benign.  He has stable lower urinary tract symptoms.  Based on his gland size I did discuss starting finasteride which may improve his symptoms and prevent any worsening of his symptoms.  He would like to start this medication and an Rx was sent to his pharmacy.  Sildenafil was refilled.  Continue annual follow-up and he was instructed to call earlier for any significant change in his symptoms.   [name], [place] [address], [place] [address], [place] [address] [phone]

## 2017-04-28 ENCOUNTER — Encounter: Payer: Self-pay | Admitting: Urology

## 2017-06-22 DIAGNOSIS — L578 Other skin changes due to chronic exposure to nonionizing radiation: Secondary | ICD-10-CM | POA: Diagnosis not present

## 2017-06-22 DIAGNOSIS — D485 Neoplasm of uncertain behavior of skin: Secondary | ICD-10-CM | POA: Diagnosis not present

## 2017-06-22 DIAGNOSIS — D1801 Hemangioma of skin and subcutaneous tissue: Secondary | ICD-10-CM | POA: Diagnosis not present

## 2017-06-22 DIAGNOSIS — L821 Other seborrheic keratosis: Secondary | ICD-10-CM | POA: Diagnosis not present

## 2017-06-22 DIAGNOSIS — D2262 Melanocytic nevi of left upper limb, including shoulder: Secondary | ICD-10-CM | POA: Diagnosis not present

## 2017-06-22 DIAGNOSIS — L820 Inflamed seborrheic keratosis: Secondary | ICD-10-CM | POA: Diagnosis not present

## 2017-06-22 DIAGNOSIS — L812 Freckles: Secondary | ICD-10-CM | POA: Diagnosis not present

## 2017-06-22 DIAGNOSIS — L570 Actinic keratosis: Secondary | ICD-10-CM | POA: Diagnosis not present

## 2017-06-22 DIAGNOSIS — Z86018 Personal history of other benign neoplasm: Secondary | ICD-10-CM

## 2017-06-22 HISTORY — DX: Personal history of other benign neoplasm: Z86.018

## 2017-07-27 DIAGNOSIS — I208 Other forms of angina pectoris: Secondary | ICD-10-CM | POA: Diagnosis not present

## 2017-07-27 DIAGNOSIS — N419 Inflammatory disease of prostate, unspecified: Secondary | ICD-10-CM | POA: Diagnosis not present

## 2017-07-27 DIAGNOSIS — I519 Heart disease, unspecified: Secondary | ICD-10-CM | POA: Diagnosis not present

## 2017-07-27 DIAGNOSIS — E785 Hyperlipidemia, unspecified: Secondary | ICD-10-CM | POA: Diagnosis not present

## 2017-07-29 ENCOUNTER — Other Ambulatory Visit: Payer: Self-pay | Admitting: Internal Medicine

## 2017-07-29 DIAGNOSIS — R110 Nausea: Secondary | ICD-10-CM

## 2017-07-29 DIAGNOSIS — R1084 Generalized abdominal pain: Secondary | ICD-10-CM

## 2017-08-02 ENCOUNTER — Ambulatory Visit: Payer: BLUE CROSS/BLUE SHIELD

## 2017-08-04 ENCOUNTER — Telehealth (HOSPITAL_COMMUNITY): Payer: Self-pay | Admitting: Cardiology

## 2017-08-05 ENCOUNTER — Ambulatory Visit: Admission: RE | Admit: 2017-08-05 | Payer: BLUE CROSS/BLUE SHIELD | Source: Ambulatory Visit

## 2017-08-08 ENCOUNTER — Other Ambulatory Visit: Payer: Self-pay | Admitting: Internal Medicine

## 2017-08-08 DIAGNOSIS — R110 Nausea: Secondary | ICD-10-CM

## 2017-08-24 ENCOUNTER — Ambulatory Visit
Admission: RE | Admit: 2017-08-24 | Discharge: 2017-08-24 | Disposition: A | Discharge status: Home / Self Care | Payer: Medicare Other | Source: Ambulatory Visit | Attending: Internal Medicine | Admitting: Internal Medicine

## 2017-08-24 DIAGNOSIS — R110 Nausea: Secondary | ICD-10-CM

## 2017-08-24 DIAGNOSIS — R932 Abnormal findings on diagnostic imaging of liver and biliary tract: Secondary | ICD-10-CM | POA: Diagnosis not present

## 2017-08-24 DIAGNOSIS — R109 Unspecified abdominal pain: Secondary | ICD-10-CM | POA: Insufficient documentation

## 2017-08-26 DIAGNOSIS — K760 Fatty (change of) liver, not elsewhere classified: Secondary | ICD-10-CM | POA: Diagnosis not present

## 2017-08-26 DIAGNOSIS — E785 Hyperlipidemia, unspecified: Secondary | ICD-10-CM | POA: Diagnosis not present

## 2017-08-26 DIAGNOSIS — I208 Other forms of angina pectoris: Secondary | ICD-10-CM | POA: Diagnosis not present

## 2017-08-26 DIAGNOSIS — K219 Gastro-esophageal reflux disease without esophagitis: Secondary | ICD-10-CM | POA: Diagnosis not present

## 2017-10-13 ENCOUNTER — Other Ambulatory Visit: Payer: Self-pay

## 2017-10-13 ENCOUNTER — Encounter: Payer: Self-pay | Admitting: Gastroenterology

## 2017-10-13 ENCOUNTER — Encounter (INDEPENDENT_AMBULATORY_CARE_PROVIDER_SITE_OTHER): Payer: Self-pay

## 2017-10-13 ENCOUNTER — Ambulatory Visit (INDEPENDENT_AMBULATORY_CARE_PROVIDER_SITE_OTHER): Payer: Medicare Other | Admitting: Gastroenterology

## 2017-10-13 VITALS — BP 121/69 | HR 80 | Ht 64.0 in | Wt 175.0 lb

## 2017-10-13 DIAGNOSIS — K760 Fatty (change of) liver, not elsewhere classified: Secondary | ICD-10-CM | POA: Diagnosis not present

## 2017-10-13 DIAGNOSIS — R932 Abnormal findings on diagnostic imaging of liver and biliary tract: Secondary | ICD-10-CM | POA: Diagnosis not present

## 2017-10-13 NOTE — Progress Notes (Signed)
Gastroenterology Consultation  Referring Provider:     Cletis Athens, MD Primary Care Physician:  Cletis Athens, MD Primary Gastroenterologist:  Dr. Allen Norris     Reason for Consultation:     Nodular liver        HPI:   Bruce Gomez is a 72 y.o. y/o male referred for consultation & management of Nodular liver by Dr. Cletis Athens, MD.  This patient comes today for evaluation of an ultrasound finding of Nodular liver.  I do not see any labs that have been recently done for liver function tests the patient has not been seen here before. The patient did have a colonoscopy in 2016 by another gastroenterologist. The patient had the ultrasound due to 3 weeks of intermittent nausea and abdominal pain back in May. The patient reports that his symptoms have resolved.  The patient denies having any alcohol abuse in his history. There is no report of any past history of liver disease reported by the patient or the patient's wife.  He denies any episodes of jaundice black stools or bloody stools.  He also denies any abdominal pain at the present time.  Past Medical History:  Diagnosis Date  . Aortic sclerosis 04/21/2017   Overview:  Moderate Aortic Sclerosis, mild LVH,  mild Mitral valve thickening, Slight LAE on ECHO - started  on ACE  for  LVH  . BPH with obstruction/lower urinary tract symptoms 03/19/2014  . CAD (coronary artery disease) 04/21/2017   Overview:   silent MI - Inferior; s/p CABG x 3; Stress Echo  6/05; 8/08;  2/12  . Chronic prostatitis 11/03/2000  . Coronary artery disease   . Elevated PSA 03/19/2014  . Erectile dysfunction 03/19/2014  . Hyperlipidemia 04/21/2017  . Impaired glucose tolerance 04/21/2017  . OSA (obstructive sleep apnea) 04/21/2017   Overview:  rx with wt loss, side sleeping  . RLS (restless legs syndrome) 04/21/2017    Past Surgical History:  Procedure Laterality Date  . ADENOIDECTOMY    . CARDIAC SURGERY     cabg 2002  . INGUINAL HERNIA REPAIR    . TRANSURETHRAL  RESECTION OF PROSTATE     2014    Prior to Admission medications   Medication Sig Start Date End Date Taking? Authorizing Provider  aspirin EC 81 MG tablet Take 81 mg by mouth.    [provider]  atorvastatin (LIPITOR) 20 MG tablet 10 mg. 10/10/12   [provider]  Cholecalciferol (VITAMIN D3) 2000 units capsule Take by mouth.    [provider]  clonazePAM (KLONOPIN) 0.5 MG tablet Take 0.25 mg by mouth. 10/10/12   [provider]  Coenzyme Q10 100 MG capsule Take 200 mg by mouth.    [provider]  DOCOSAHEXAENOIC ACID PO Take by mouth.    [provider]  finasteride (PROSCAR) 5 MG tablet Take 1 tablet (5 mg total) by mouth daily. 04/21/17   Stoioff, Ronda Fairly, MD  fluticasone (FLONASE) 50 MCG/ACT nasal spray 1 spray by Each Nare route daily.    [provider]  folic acid (FOLVITE) 093 MCG tablet Take by mouth.    [provider]  ibuprofen (ADVIL,MOTRIN) 200 MG tablet Take 200 mg by mouth.    [provider]  lisinopril (PRINIVIL,ZESTRIL) 2.5 MG tablet  03/09/17   [provider]  Melatonin 3 MG TABS Take by mouth.    [provider]  NIASPAN 500 MG CR tablet  03/23/17   [provider]  nitroGLYCERIN (NITROSTAT) 0.4 MG SL tablet Place 0.4 mg under the tongue. 05/17/12   [provider]  pseudoephedrine (SUDAFED) 30 MG tablet Take by mouth.    [provider]  sildenafil (REVATIO) 20 MG tablet 1-5 PO QDAY PRN, USE AS DIRECTED. 04/21/17   Stoioff, Ronda Fairly, MD  TOPROL XL 25 MG 24 hr tablet  01/27/17   [provider]  vitamin C (ASCORBIC ACID) 500 MG tablet Take 500 mg by mouth.    [provider]    Family History  Problem Relation Age of Onset  . Prostate cancer Paternal Uncle   . Bladder Cancer Neg Hx   . Kidney cancer Neg Hx      Social History   Tobacco Use  . Smoking status: Never Smoker  . Smokeless tobacco: Never Used  Substance Use  Topics  . Alcohol use: No  . Drug use: No    Allergies as of 10/13/2017 - Review Complete 04/28/2017  Allergen Reaction Noted  . Sulfa antibiotics Other (See Comments) 01/15/2016  . Benadryl [diphenhydramine hcl (sleep)] Anxiety 01/15/2016    Review of Systems:    All systems reviewed and negative except where noted in HPI.   Physical Exam:  There were no vitals taken for this visit. No LMP for male patient. General:   Alert,  Well-developed, well-nourished, pleasant and cooperative in NAD Head:  Normocephalic and atraumatic. Eyes:  Sclera clear, no icterus.   Conjunctiva pink. Ears:  Normal auditory acuity. Nose:  No deformity, discharge, or lesions. Mouth:  No deformity or lesions,oropharynx pink & moist. Neck:  Supple; no masses or thyromegaly. Lungs:  Respirations even and unlabored.  Clear throughout to auscultation.   No wheezes, crackles, or rhonchi. No acute distress. Heart:  Regular rate and rhythm; no murmurs, clicks, rubs, or gallops. Abdomen:  Normal bowel sounds.  No bruits.  Soft, non-tender and non-distended without masses, hepatosplenomegaly or hernias noted.  No guarding or rebound tenderness.  Negative Carnett sign.   Rectal:  Deferred.  Msk:  Symmetrical without gross deformities.  Good, equal movement & strength bilaterally. Pulses:  Normal pulses noted. Extremities:  No clubbing or edema.  No cyanosis. Neurologic:  Alert and oriented x3;  grossly normal neurologically. Skin:  Intact without significant lesions or rashes.  No jaundice. Lymph Nodes:  No significant cervical adenopathy. Psych:  Alert and cooperative. Normal mood and affect.  Imaging Studies: No results found.  Assessment and Plan:   Bruce Gomez is a 72 y.o. y/o male who comes in today with a incidental finding of a somewhat nodular liver and is here for evaluation of this. The patient has no history of past liver disease or family history of liver disease.  The patient also has no history  of abnormal liver enzymes.  The patient has not had liver enzymes checked in some time and will be set up for a blood test for LFTs.  The patient will also have his hepatitis C sent off since he was born between [place].  The patient will also be set up for a liver elasticity test.  The patient and his wife have been explained the plan and agree with it.  Lucilla Lame, MD. Marval Regal    Note: This dictation was prepared with Dragon dictation along with smaller phrase technology. Any transcriptional errors that result from this process are unintentional.

## 2017-10-14 LAB — HEPATITIS C ANTIBODY: Hep C Virus Ab: <0.1 s/co ratio (ref 0.0–0.9)

## 2017-10-14 LAB — HEPATIC FUNCTION PANEL
ALBUMIN: 4.3 g/dL (ref 3.5–4.8)
ALT: 19 IU/L (ref 0–44)
AST: 23 IU/L (ref 0–40)
Alkaline Phosphatase: 85 IU/L (ref 39–117)
Bilirubin, Direct: 0.08 mg/dL (ref 0.00–0.40)
TOTAL PROTEIN: 7 g/dL (ref 6.0–8.5)

## 2017-10-18 ENCOUNTER — Telehealth: Payer: Self-pay

## 2017-10-18 NOTE — Telephone Encounter (Signed)
-----   Message from Lucilla Lame, MD sent at 10/15/2017  8:11 AM EDT ----- The patient know that his hepatitis C was negative and his liver enzymes are normal.

## 2017-10-18 NOTE — Telephone Encounter (Signed)
Pt stated you were going to order an elastography depending on the results of these labs. Since they are both normal are we ordering the image?

## 2017-10-19 ENCOUNTER — Other Ambulatory Visit: Payer: Self-pay

## 2017-10-19 DIAGNOSIS — K760 Fatty (change of) liver, not elsewhere classified: Secondary | ICD-10-CM

## 2017-10-19 NOTE — Telephone Encounter (Signed)
My note already states that he should have had a last time her feet ordered already.

## 2017-10-19 NOTE — Telephone Encounter (Signed)
Pt notified of lab results.   RUQ US/Elastography scheduled at [facility] on 10/21/17 at 9:30am. Pt notified to arrive at 9:15am and to be NPO after midnight on Thursday night.

## 2017-10-19 NOTE — Telephone Encounter (Signed)
-----   Message from Lucilla Lame, MD sent at 10/15/2017  8:11 AM EDT ----- The patient know that his hepatitis C was negative and his liver enzymes are normal.

## 2017-10-19 NOTE — Telephone Encounter (Signed)
Patient LVM that his has been waiting for you to call him with results.

## 2017-10-28 ENCOUNTER — Ambulatory Visit
Admission: RE | Admit: 2017-10-28 | Discharge: 2017-10-28 | Disposition: A | Discharge status: Home / Self Care | Payer: Medicare Other | Source: Ambulatory Visit | Attending: Gastroenterology | Admitting: Gastroenterology

## 2017-10-28 DIAGNOSIS — K760 Fatty (change of) liver, not elsewhere classified: Secondary | ICD-10-CM | POA: Diagnosis not present

## 2017-10-31 ENCOUNTER — Telehealth: Payer: Self-pay

## 2017-10-31 NOTE — Telephone Encounter (Signed)
-----   Message from Lucilla Lame, MD sent at 10/28/2017  9:52 PM EDT ----- Let the patient know that his fibrosis scan showed little or no fibrosis and no cirrhosis. No further liver workup is needed.

## 2017-10-31 NOTE — Telephone Encounter (Signed)
Pt is returning call for Ginger

## 2017-10-31 NOTE — Telephone Encounter (Signed)
LVM for pt to return my call.

## 2017-11-01 NOTE — Telephone Encounter (Signed)
Pt notified of results

## 2017-11-07 ENCOUNTER — Other Ambulatory Visit: Payer: Self-pay

## 2018-01-12 DIAGNOSIS — Z85828 Personal history of other malignant neoplasm of skin: Secondary | ICD-10-CM | POA: Diagnosis not present

## 2018-01-12 DIAGNOSIS — L812 Freckles: Secondary | ICD-10-CM | POA: Diagnosis not present

## 2018-01-12 DIAGNOSIS — L578 Other skin changes due to chronic exposure to nonionizing radiation: Secondary | ICD-10-CM | POA: Diagnosis not present

## 2018-01-12 DIAGNOSIS — D229 Melanocytic nevi, unspecified: Secondary | ICD-10-CM | POA: Diagnosis not present

## 2018-01-12 DIAGNOSIS — D1800 Hemangioma unspecified site: Secondary | ICD-10-CM | POA: Diagnosis not present

## 2018-01-12 DIAGNOSIS — D485 Neoplasm of uncertain behavior of skin: Secondary | ICD-10-CM | POA: Diagnosis not present

## 2018-01-12 DIAGNOSIS — L821 Other seborrheic keratosis: Secondary | ICD-10-CM | POA: Diagnosis not present

## 2018-01-12 DIAGNOSIS — L570 Actinic keratosis: Secondary | ICD-10-CM | POA: Diagnosis not present

## 2018-01-12 DIAGNOSIS — Z1283 Encounter for screening for malignant neoplasm of skin: Secondary | ICD-10-CM | POA: Diagnosis not present

## 2018-01-23 DIAGNOSIS — Z23 Encounter for immunization: Secondary | ICD-10-CM | POA: Diagnosis not present

## 2018-01-30 DIAGNOSIS — E7849 Other hyperlipidemia: Secondary | ICD-10-CM | POA: Diagnosis not present

## 2018-01-30 DIAGNOSIS — R6883 Chills (without fever): Secondary | ICD-10-CM | POA: Diagnosis not present

## 2018-01-30 DIAGNOSIS — R5381 Other malaise: Secondary | ICD-10-CM | POA: Diagnosis not present

## 2018-01-30 DIAGNOSIS — I10 Essential (primary) hypertension: Secondary | ICD-10-CM | POA: Diagnosis not present

## 2018-02-02 DIAGNOSIS — I208 Other forms of angina pectoris: Secondary | ICD-10-CM | POA: Diagnosis not present

## 2018-02-02 DIAGNOSIS — K760 Fatty (change of) liver, not elsewhere classified: Secondary | ICD-10-CM | POA: Diagnosis not present

## 2018-02-02 DIAGNOSIS — K219 Gastro-esophageal reflux disease without esophagitis: Secondary | ICD-10-CM | POA: Diagnosis not present

## 2018-02-02 DIAGNOSIS — I519 Heart disease, unspecified: Secondary | ICD-10-CM | POA: Diagnosis not present

## 2018-02-07 DIAGNOSIS — H2513 Age-related nuclear cataract, bilateral: Secondary | ICD-10-CM | POA: Diagnosis not present

## 2018-03-16 DIAGNOSIS — I208 Other forms of angina pectoris: Secondary | ICD-10-CM | POA: Diagnosis not present

## 2018-03-16 DIAGNOSIS — I25701 Atherosclerosis of coronary artery bypass graft(s), unspecified, with angina pectoris with documented spasm: Secondary | ICD-10-CM | POA: Diagnosis not present

## 2018-03-16 DIAGNOSIS — R931 Abnormal findings on diagnostic imaging of heart and coronary circulation: Secondary | ICD-10-CM | POA: Diagnosis not present

## 2018-03-16 DIAGNOSIS — R079 Chest pain, unspecified: Secondary | ICD-10-CM | POA: Diagnosis not present

## 2018-03-16 DIAGNOSIS — J302 Other seasonal allergic rhinitis: Secondary | ICD-10-CM | POA: Diagnosis not present

## 2018-03-17 DIAGNOSIS — E785 Hyperlipidemia, unspecified: Secondary | ICD-10-CM | POA: Diagnosis not present

## 2018-03-17 DIAGNOSIS — N419 Inflammatory disease of prostate, unspecified: Secondary | ICD-10-CM | POA: Diagnosis not present

## 2018-03-17 DIAGNOSIS — I208 Other forms of angina pectoris: Secondary | ICD-10-CM | POA: Diagnosis not present

## 2018-03-17 DIAGNOSIS — I25701 Atherosclerosis of coronary artery bypass graft(s), unspecified, with angina pectoris with documented spasm: Secondary | ICD-10-CM | POA: Diagnosis not present

## 2018-04-04 ENCOUNTER — Other Ambulatory Visit: Payer: Self-pay | Admitting: Urology

## 2018-04-04 NOTE — Telephone Encounter (Signed)
Patient called the office today to request a refill of finasteride to be sent to [facility].  He has a follow up appointment on 05/02/18.

## 2018-04-06 MED ORDER — FINASTERIDE 5 MG PO TABS: 5.0000 mg | ORAL_TABLET | Freq: Every day | ORAL | 1 refills | Status: DC

## 2018-04-06 NOTE — Telephone Encounter (Signed)
Finasteride refill sent to [facility] for 30 day supply until patient is seen for follow up.

## 2018-04-25 ENCOUNTER — Ambulatory Visit: Payer: Medicare Other | Admitting: Urology

## 2018-05-02 ENCOUNTER — Encounter: Payer: Self-pay | Admitting: Urology

## 2018-05-02 ENCOUNTER — Ambulatory Visit (INDEPENDENT_AMBULATORY_CARE_PROVIDER_SITE_OTHER): Payer: Medicare Other | Admitting: Urology

## 2018-05-02 VITALS — BP 119/73 | HR 85 | Ht 64.0 in | Wt 172.1 lb

## 2018-05-02 DIAGNOSIS — N138 Other obstructive and reflux uropathy: Secondary | ICD-10-CM

## 2018-05-02 DIAGNOSIS — N401 Enlarged prostate with lower urinary tract symptoms: Secondary | ICD-10-CM

## 2018-05-02 DIAGNOSIS — R972 Elevated prostate specific antigen [PSA]: Secondary | ICD-10-CM

## 2018-05-02 DIAGNOSIS — N5201 Erectile dysfunction due to arterial insufficiency: Secondary | ICD-10-CM | POA: Diagnosis not present

## 2018-05-02 LAB — BLADDER SCAN AMB NON-IMAGING

## 2018-05-02 MED ORDER — FINASTERIDE 5 MG PO TABS: 5.0000 mg | ORAL_TABLET | Freq: Every day | ORAL | 3 refills | Status: DC

## 2018-05-02 MED ORDER — SILDENAFIL CITRATE 100 MG PO TABS: ORAL_TABLET | ORAL | 3 refills | Status: DC

## 2018-05-02 NOTE — Patient Instructions (Signed)

## 2018-05-02 NOTE — Progress Notes (Signed)
05/02/2018 2:25 PM   Bruce Gomez [birth year] [id number]  Referring provider: Cletis Athens, MD [address] [place], [place] [address]  Chief Complaint  Patient presents with  . Benign Prostatic Hypertrophy  . Elevated PSA   Urologic History:  1.  Elevated PSA-previously followed in [place].  Four prior prostate biopsies since the early 2000 with benign pathology and a baseline PSA between 11-13.  2.  BPH-urinary retention in 2014 and status post TURP by Dr. Robby Sermon with an incomplete resection.  He had resection of a large intravesical median lobe with 26 g resected.  3.  Erectile dysfunction.-On sildenafil   HPI: 73 year old male presents for annual follow-up.  At his visit last year he elected to start finasteride.  He denies bothersome lower urinary tract symptoms.  He had a PSA performed with his PCP in October 2019 with an appropriate decrease at 4.6.  Denies dysuria, gross hematuria or flank/abdominal/pelvic/scrotal pain.  He remains on sildenafil with good efficacy.   PMH: Past Medical History:  Diagnosis Date  . Aortic sclerosis 04/21/2017   Overview:  Moderate Aortic Sclerosis, mild LVH,  mild Mitral valve thickening, Slight LAE on ECHO - started  on ACE  for  LVH  . BPH with obstruction/lower urinary tract symptoms 03/19/2014  . CAD (coronary artery disease) 04/21/2017   Overview:   silent MI - Inferior; s/p CABG x 3; Stress Echo  6/05; 8/08;  2/12  . Chronic prostatitis 11/03/2000  . Coronary artery disease   . Elevated PSA 03/19/2014  . Erectile dysfunction 03/19/2014  . Hyperlipidemia 04/21/2017  . Impaired glucose tolerance 04/21/2017  . OSA (obstructive sleep apnea) 04/21/2017   Overview:  rx with wt loss, side sleeping  . RLS (restless legs syndrome) 04/21/2017    Surgical History: Past Surgical History:  Procedure Laterality Date  . ADENOIDECTOMY    . CARDIAC SURGERY     cabg 2002  . INGUINAL HERNIA REPAIR    . TRANSURETHRAL RESECTION OF  PROSTATE     2014    Home Medications:  Allergies as of 05/02/2018      Reactions   Pollen Extract    Sulfa Antibiotics Other (See Comments)   Dizziness   Sulfamethoxazole-trimethoprim    dizzy dizzy   Benadryl [diphenhydramine Hcl (sleep)] Anxiety      Medication List       Accurate as of May 02, 2018  2:25 PM. Always use your most recent med list.        aspirin EC 81 MG tablet Take 81 mg by mouth.   atorvastatin 20 MG tablet Commonly known as:  LIPITOR 10 mg.   atorvastatin 10 MG tablet Commonly known as:  LIPITOR   clonazePAM 0.5 MG tablet Commonly known as:  KLONOPIN Take 0.25 mg by mouth.   Coenzyme Q10 100 MG capsule Take 200 mg by mouth.   DOCOSAHEXAENOIC ACID PO Take by mouth.   esomeprazole 40 MG capsule Commonly known as:  NEXIUM   finasteride 5 MG tablet Commonly known as:  PROSCAR Take 1 tablet (5 mg total) by mouth daily.   fluticasone 50 MCG/ACT nasal spray Commonly known as:  FLONASE 1 spray by Each Nare route daily.   folic acid 818 MCG tablet Commonly known as:  FOLVITE Take by mouth.   ibuprofen 200 MG tablet Commonly known as:  ADVIL,MOTRIN Take 200 mg by mouth.   lisinopril 2.5 MG tablet Commonly known as:  PRINIVIL,ZESTRIL   loratadine 10 MG tablet Commonly known as:  CLARITIN Take 10 mg by mouth daily.   Melatonin 3 MG Tabs Take by mouth.   naproxen sodium 220 MG tablet Commonly known as:  ALEVE Take by mouth.   NIASPAN 500 MG CR tablet Generic drug:  niacin   nitroGLYCERIN 0.4 MG SL tablet Commonly known as:  NITROSTAT Place 0.4 mg under the tongue.   Omega-3 1000 MG Caps Take by mouth.   pseudoephedrine 30 MG tablet Commonly known as:  SUDAFED Take by mouth.   sildenafil 100 MG tablet Commonly known as:  VIAGRA   sildenafil 20 MG tablet Commonly known as:  REVATIO 1-5 PO QDAY PRN, USE AS DIRECTED.   TOPROL XL 25 MG 24 hr tablet Generic drug:  metoprolol succinate   VERAMYST 27.5 MCG/SPRAY  nasal spray Generic drug:  fluticasone Place into the nose.   vitamin C 500 MG tablet Commonly known as:  ASCORBIC ACID Take 500 mg by mouth.   Vitamin D3 50 MCG (2000 UT) capsule Take by mouth.       Allergies:  Allergies  Allergen Reactions  . Pollen Extract   . Sulfa Antibiotics Other (See Comments)    Dizziness   . Sulfamethoxazole-Trimethoprim     dizzy dizzy   . Benadryl [Diphenhydramine Hcl (Sleep)] Anxiety    Family History: Family History  Problem Relation Age of Onset  . Prostate cancer Paternal Uncle   . Bladder Cancer Neg Hx   . Kidney cancer Neg Hx     Social History:  reports that he has never smoked. He has never used smokeless tobacco. He reports current alcohol use. He reports that he does not use drugs.  ROS: UROLOGY Frequent Urination?: No Hard to postpone urination?: No Burning/pain with urination?: No Get up at night to urinate?: Yes Leakage of urine?: No Urine stream starts and stops?: No Trouble starting stream?: No Do you have to strain to urinate?: No Blood in urine?: No Urinary tract infection?: No Sexually transmitted disease?: No Injury to kidneys or bladder?: No Painful intercourse?: No Weak stream?: No Erection problems?: Yes Penile pain?: No  Gastrointestinal Nausea?: No Vomiting?: No Indigestion/heartburn?: No Diarrhea?: No Constipation?: No  Constitutional Fever: No Night sweats?: No Weight loss?: No Fatigue?: No  Skin Skin rash/lesions?: No Itching?: No  Eyes Blurred vision?: No Double vision?: No  Ears/Nose/Throat Sore throat?: No Sinus problems?: No  Hematologic/Lymphatic Swollen glands?: No Easy bruising?: No  Cardiovascular Leg swelling?: No Chest pain?: No  Respiratory Cough?: No Shortness of breath?: No  Endocrine Excessive thirst?: No  Musculoskeletal Back pain?: Yes Joint pain?: Yes  Neurological Headaches?: No Dizziness?: No  Psychologic Depression?: No Anxiety?:  No  Physical Exam: BP 119/73 (BP Location: Left Arm, Patient Position: Sitting, Cuff Size: Normal)   Pulse 85   Ht 5\' 4"  (1.626 m)   Wt 172 lb 1.6 oz (78.1 kg)   BMI 29.54 kg/m   Constitutional:  Alert and oriented, No acute distress. HEENT: [place] AT, moist mucus membranes.  Trachea midline, no masses. Cardiovascular: No clubbing, cyanosis, or edema. Respiratory: Normal respiratory effort, no increased work of breathing. GI: Abdomen is soft, nontender, nondistended, no abdominal masses GU: No CVA tenderness.  Prostate 60 g L >R.  No nodules or induration Lymph: No cervical or inguinal lymphadenopathy. Skin: No rashes, bruises or suspicious lesions. Neurologic: Grossly intact, no focal deficits, moving all 4 extremities. Psychiatric: Normal mood and affect.   Assessment & Plan:    1. BPH with obstruction/lower urinary tract symptoms Stable lower urinary  tract symptoms.  PVR by bladder scan today was 0 mL.  Finasteride was refilled.  2. Elevated PSA Stable PSA with an appropriate decrease on finasteride. Continue annual follow-up  3.  Erectile dysfunction Stable.  Sildenafil was refilled.   [name], [place] [address], [place], [place] [address] [phone]

## 2018-07-03 IMAGING — US US EXTREM LOW VENOUS BILAT
1 series · 13 of 24 positions shown · non-contrast
Comparison: None.

CLINICAL DATA: 70-year-old male with a history of bilateral leg
pain



[Series 1: us extrem low venous bilat · 0.07mm/px · 13 of 63 slices shown]
[im 1/63]
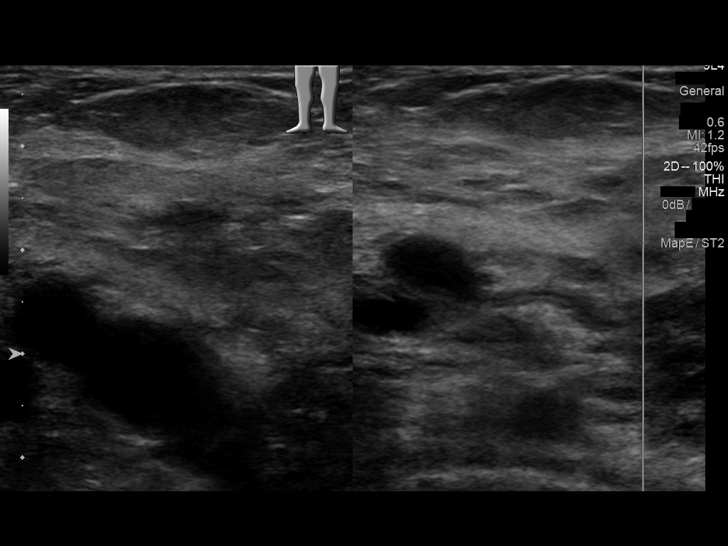
[im 6/63]
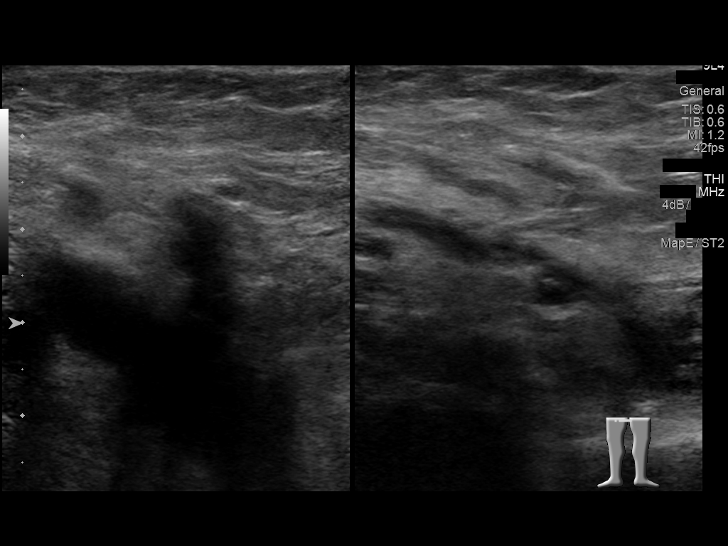
[im 11/63]
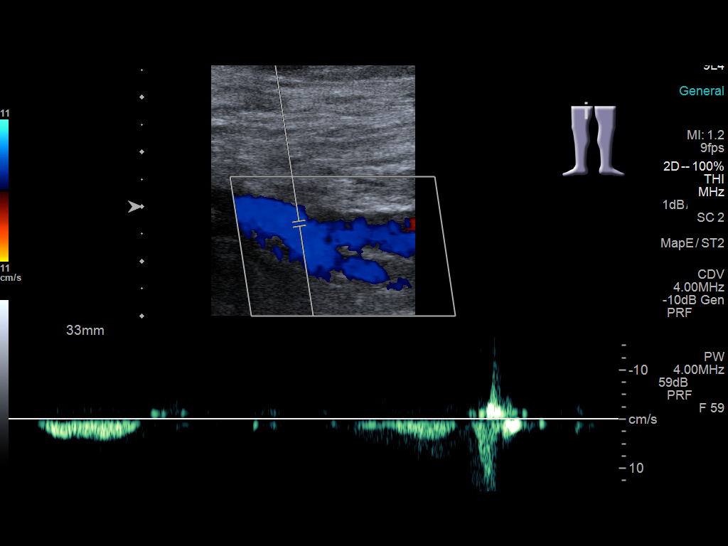
[im 17/63]
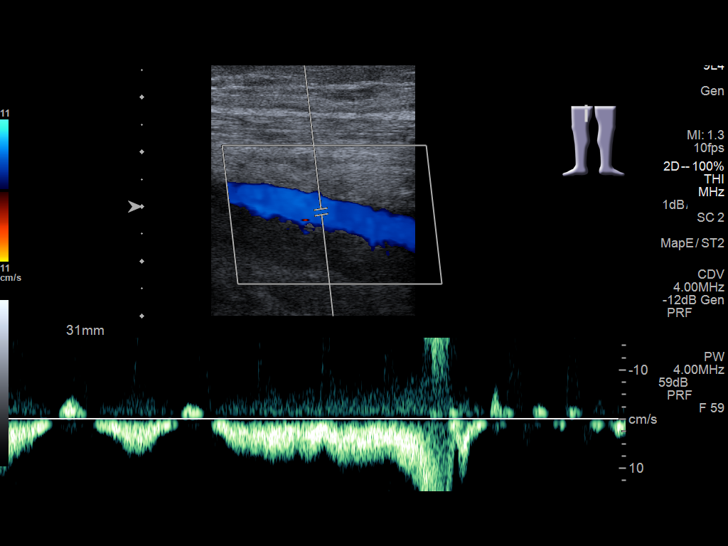
[im 22/63]
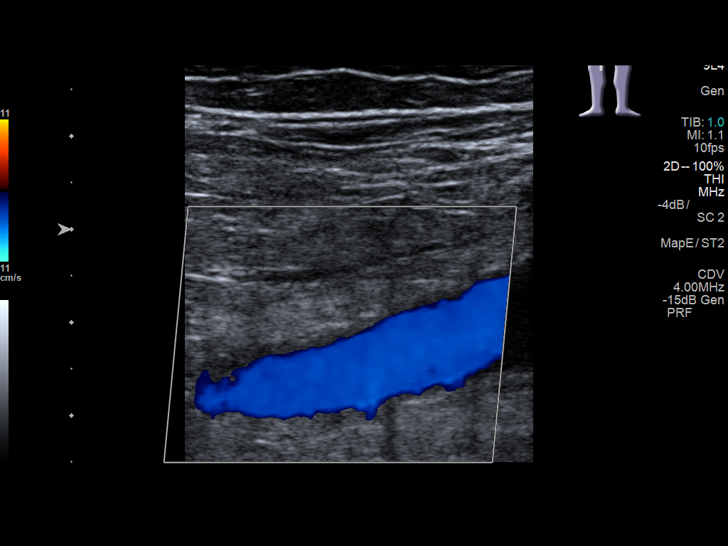
[im 27/63]
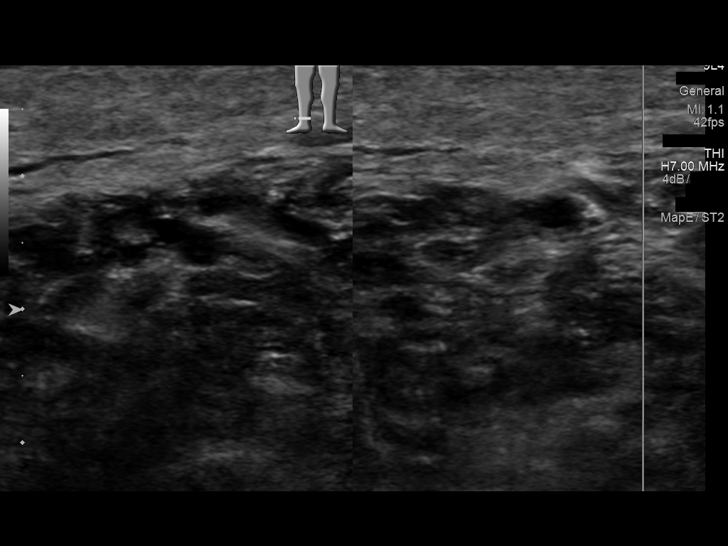
[im 33/63]
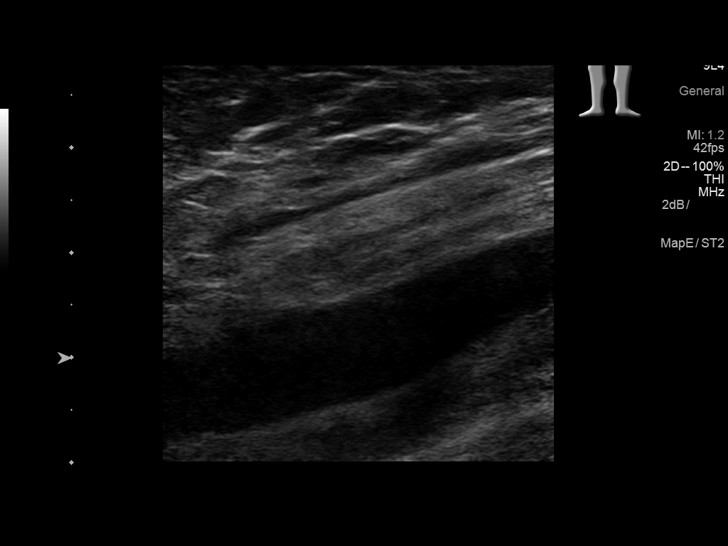
[im 36/63]
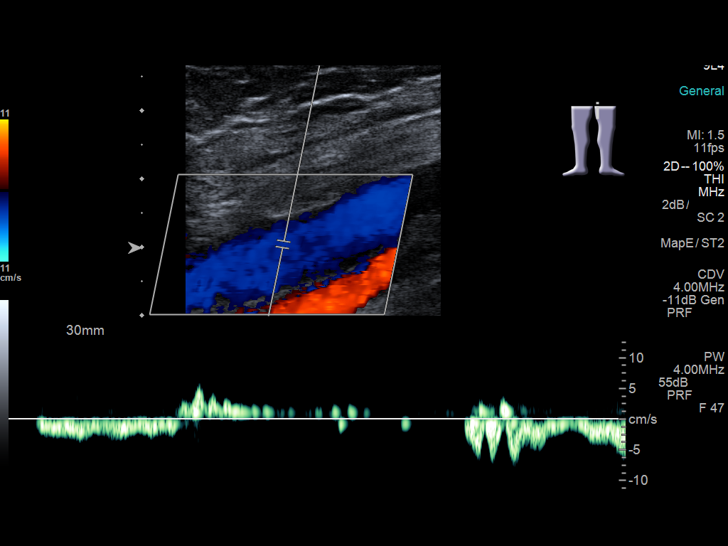
[im 41/63]
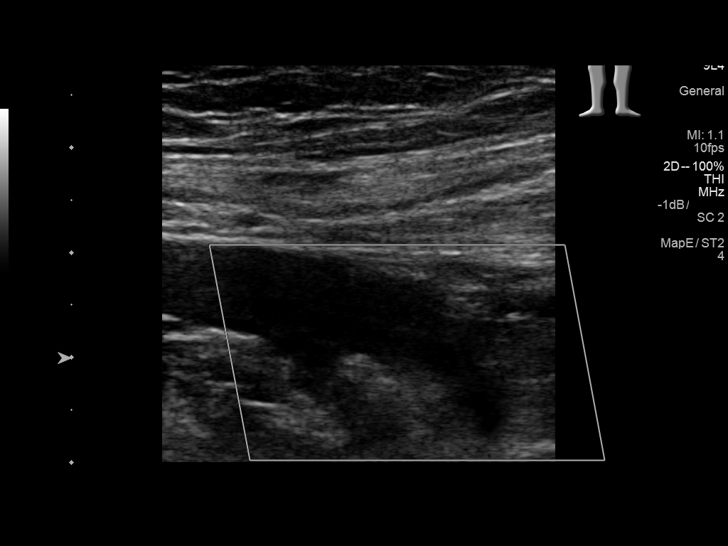
[im 46/63]
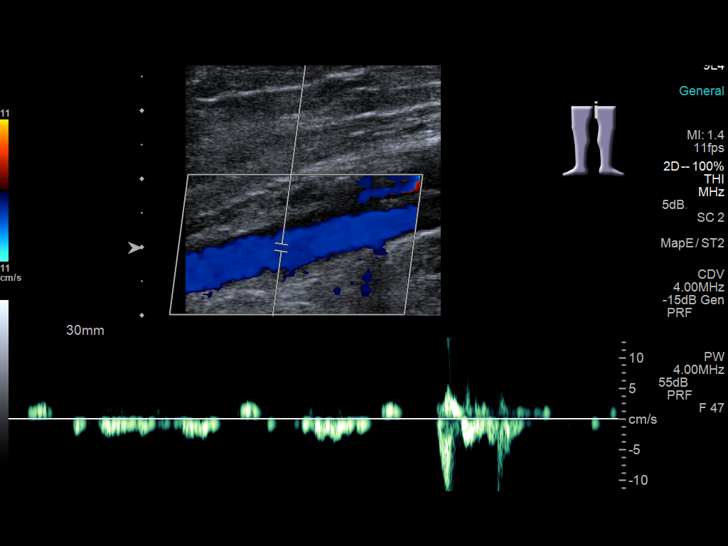
[im 52/63]
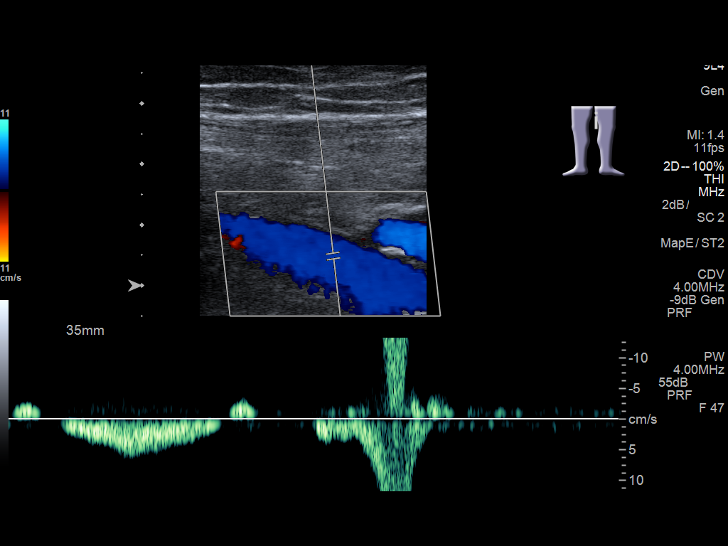
[im 57/63]
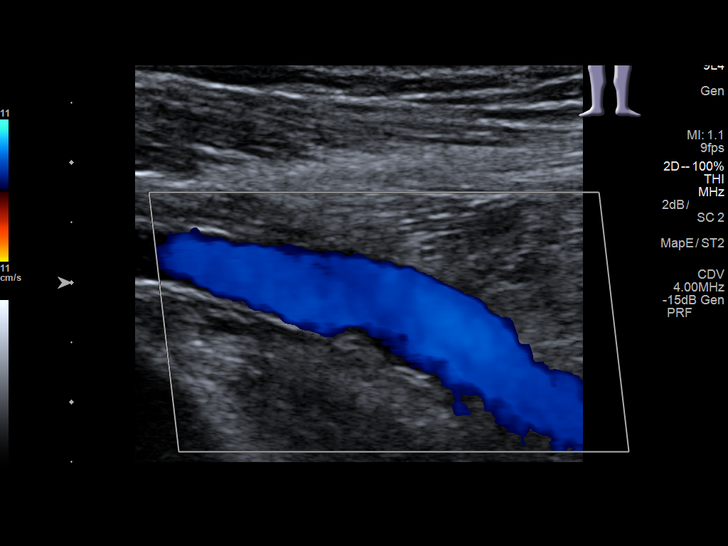
[im 63/63]
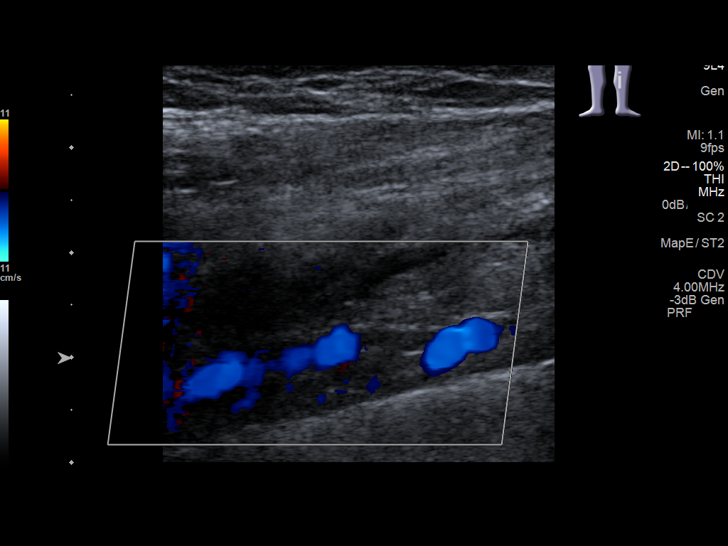

[13 of 24 positions shown; findings below may reference images not displayed]

FINDINGS: RIGHT LOWER EXTREMITY

Common Femoral Vein: No evidence of thrombus. Normal
compressibility, respiratory phasicity and response to augmentation.

Saphenofemoral Junction: No evidence of thrombus. Normal
compressibility and flow on color Doppler imaging.

Profunda Femoral Vein: No evidence of thrombus. Normal
compressibility and flow on color Doppler imaging.

Femoral Vein: No evidence of thrombus. Normal compressibility,
respiratory phasicity and response to augmentation.

Popliteal Vein: No evidence of thrombus. Normal compressibility,
respiratory phasicity and response to augmentation.

Calf Veins: No evidence of thrombus. Normal compressibility and flow
on color Doppler imaging.

Superficial Great Saphenous Vein: No evidence of thrombus. Normal
compressibility and flow on color Doppler imaging.

Other Findings:  None.

LEFT LOWER EXTREMITY

Common Femoral Vein: No evidence of thrombus. Normal
compressibility, respiratory phasicity and response to augmentation.

Saphenofemoral Junction: No evidence of thrombus. Normal
compressibility and flow on color Doppler imaging.

Profunda Femoral Vein: No evidence of thrombus. Normal
compressibility and flow on color Doppler imaging.

Femoral Vein: No evidence of thrombus. Normal compressibility,
respiratory phasicity and response to augmentation.

Popliteal Vein: No evidence of thrombus. Normal compressibility,
respiratory phasicity and response to augmentation.

Calf Veins: No evidence of thrombus. Normal compressibility and flow
on color Doppler imaging.

Superficial Great Saphenous Vein: No evidence of thrombus. Normal
compressibility and flow on color Doppler imaging.

Other Findings:  None.
IMPRESSION: Sonographic survey of the bilateral lower extremities negative for
DVT.

## 2018-08-02 DIAGNOSIS — E785 Hyperlipidemia, unspecified: Secondary | ICD-10-CM | POA: Diagnosis not present

## 2018-08-02 DIAGNOSIS — I25701 Atherosclerosis of coronary artery bypass graft(s), unspecified, with angina pectoris with documented spasm: Secondary | ICD-10-CM | POA: Diagnosis not present

## 2018-08-02 DIAGNOSIS — I208 Other forms of angina pectoris: Secondary | ICD-10-CM | POA: Diagnosis not present

## 2018-08-02 DIAGNOSIS — J302 Other seasonal allergic rhinitis: Secondary | ICD-10-CM | POA: Diagnosis not present

## 2018-10-17 DIAGNOSIS — K469 Unspecified abdominal hernia without obstruction or gangrene: Secondary | ICD-10-CM | POA: Diagnosis not present

## 2018-10-17 DIAGNOSIS — I25701 Atherosclerosis of coronary artery bypass graft(s), unspecified, with angina pectoris with documented spasm: Secondary | ICD-10-CM | POA: Diagnosis not present

## 2018-10-17 DIAGNOSIS — I208 Other forms of angina pectoris: Secondary | ICD-10-CM | POA: Diagnosis not present

## 2018-10-17 DIAGNOSIS — K439 Ventral hernia without obstruction or gangrene: Secondary | ICD-10-CM | POA: Diagnosis not present

## 2019-01-10 DIAGNOSIS — Z23 Encounter for immunization: Secondary | ICD-10-CM | POA: Diagnosis not present

## 2019-01-29 DIAGNOSIS — L570 Actinic keratosis: Secondary | ICD-10-CM | POA: Diagnosis not present

## 2019-01-29 DIAGNOSIS — D225 Melanocytic nevi of trunk: Secondary | ICD-10-CM | POA: Diagnosis not present

## 2019-01-29 DIAGNOSIS — Z85828 Personal history of other malignant neoplasm of skin: Secondary | ICD-10-CM | POA: Diagnosis not present

## 2019-01-29 DIAGNOSIS — D1800 Hemangioma unspecified site: Secondary | ICD-10-CM | POA: Diagnosis not present

## 2019-01-29 DIAGNOSIS — L821 Other seborrheic keratosis: Secondary | ICD-10-CM | POA: Diagnosis not present

## 2019-01-29 DIAGNOSIS — Z86018 Personal history of other benign neoplasm: Secondary | ICD-10-CM | POA: Diagnosis not present

## 2019-01-29 DIAGNOSIS — L820 Inflamed seborrheic keratosis: Secondary | ICD-10-CM | POA: Diagnosis not present

## 2019-01-29 DIAGNOSIS — D229 Melanocytic nevi, unspecified: Secondary | ICD-10-CM | POA: Diagnosis not present

## 2019-01-29 DIAGNOSIS — Z1283 Encounter for screening for malignant neoplasm of skin: Secondary | ICD-10-CM | POA: Diagnosis not present

## 2019-02-07 DIAGNOSIS — K439 Ventral hernia without obstruction or gangrene: Secondary | ICD-10-CM | POA: Diagnosis not present

## 2019-02-07 DIAGNOSIS — I25701 Atherosclerosis of coronary artery bypass graft(s), unspecified, with angina pectoris with documented spasm: Secondary | ICD-10-CM | POA: Diagnosis not present

## 2019-02-07 DIAGNOSIS — Z0000 Encounter for general adult medical examination without abnormal findings: Secondary | ICD-10-CM | POA: Diagnosis not present

## 2019-02-07 DIAGNOSIS — E785 Hyperlipidemia, unspecified: Secondary | ICD-10-CM | POA: Diagnosis not present

## 2019-02-07 DIAGNOSIS — I208 Other forms of angina pectoris: Secondary | ICD-10-CM | POA: Diagnosis not present

## 2019-02-28 ENCOUNTER — Other Ambulatory Visit: Payer: Self-pay

## 2019-03-09 DIAGNOSIS — N1831 Chronic kidney disease, stage 3a: Secondary | ICD-10-CM | POA: Diagnosis not present

## 2019-03-09 DIAGNOSIS — I10 Essential (primary) hypertension: Secondary | ICD-10-CM | POA: Diagnosis not present

## 2019-03-12 ENCOUNTER — Other Ambulatory Visit (HOSPITAL_COMMUNITY): Payer: Self-pay | Admitting: Nephrology

## 2019-03-12 ENCOUNTER — Other Ambulatory Visit: Payer: Self-pay | Admitting: Nephrology

## 2019-03-12 DIAGNOSIS — N1831 Chronic kidney disease, stage 3a: Secondary | ICD-10-CM

## 2019-03-22 ENCOUNTER — Other Ambulatory Visit: Payer: Self-pay

## 2019-03-22 ENCOUNTER — Ambulatory Visit
Admission: RE | Admit: 2019-03-22 | Discharge: 2019-03-22 | Disposition: A | Discharge status: Home / Self Care | Payer: Medicare Other | Source: Ambulatory Visit | Attending: Nephrology | Admitting: Nephrology

## 2019-03-22 DIAGNOSIS — N1831 Chronic kidney disease, stage 3a: Secondary | ICD-10-CM | POA: Diagnosis present

## 2019-04-24 ENCOUNTER — Telehealth: Payer: Self-pay | Admitting: Urology

## 2019-04-24 NOTE — Telephone Encounter (Signed)
Pt has requested RX refills through his pharmacy and wants to let us know to change pharmacy to [facility].

## 2019-04-25 ENCOUNTER — Other Ambulatory Visit: Payer: Self-pay | Admitting: *Deleted

## 2019-04-25 DIAGNOSIS — N138 Other obstructive and reflux uropathy: Secondary | ICD-10-CM

## 2019-04-25 DIAGNOSIS — R972 Elevated prostate specific antigen [PSA]: Secondary | ICD-10-CM

## 2019-04-25 MED ORDER — FINASTERIDE 5 MG PO TABS: 5.0000 mg | ORAL_TABLET | Freq: Every day | ORAL | 3 refills | Status: DC

## 2019-05-02 DIAGNOSIS — M51369 Other intervertebral disc degeneration, lumbar region without mention of lumbar back pain or lower extremity pain: Secondary | ICD-10-CM | POA: Insufficient documentation

## 2019-05-02 DIAGNOSIS — M5136 Other intervertebral disc degeneration, lumbar region: Secondary | ICD-10-CM | POA: Insufficient documentation

## 2019-05-03 NOTE — Telephone Encounter (Signed)
[employer] called and would like refills sent for Finasteride and Sildenafil sent to Fax# [phone] Ph# [phone]

## 2019-05-03 NOTE — Telephone Encounter (Signed)
Talked with patient hs states he has enough pills until his next appt.

## 2019-05-08 ENCOUNTER — Ambulatory Visit: Payer: Medicare Other | Admitting: Urology

## 2019-05-09 ENCOUNTER — Ambulatory Visit (INDEPENDENT_AMBULATORY_CARE_PROVIDER_SITE_OTHER): Payer: Medicare PPO | Admitting: Urology

## 2019-05-09 ENCOUNTER — Ambulatory Visit: Payer: Medicare Other | Admitting: Urology

## 2019-05-09 ENCOUNTER — Encounter: Payer: Self-pay | Admitting: Urology

## 2019-05-09 ENCOUNTER — Other Ambulatory Visit: Payer: Self-pay

## 2019-05-09 VITALS — BP 148/90 | HR 77 | Ht 62.0 in | Wt 146.0 lb

## 2019-05-09 DIAGNOSIS — N401 Enlarged prostate with lower urinary tract symptoms: Secondary | ICD-10-CM | POA: Diagnosis not present

## 2019-05-09 DIAGNOSIS — N5201 Erectile dysfunction due to arterial insufficiency: Secondary | ICD-10-CM

## 2019-05-09 DIAGNOSIS — R972 Elevated prostate specific antigen [PSA]: Secondary | ICD-10-CM | POA: Diagnosis not present

## 2019-05-09 DIAGNOSIS — N138 Other obstructive and reflux uropathy: Secondary | ICD-10-CM

## 2019-05-09 LAB — BLADDER SCAN AMB NON-IMAGING: Scan Result: 0

## 2019-05-09 NOTE — Progress Notes (Signed)
05/09/2019 9:07 AM   Volney Presser 08-Feb-1946 [id number]  Referring provider: Cletis Athens, MD [address] [place],   [address]  Chief Complaint  Patient presents with  . Benign Prostatic Hypertrophy    Urologic History:  1.Elevated PSA-previously followed in [place]. Four prior prostate biopsies since the early 2000 with benign pathology and a baseline PSA between 11-13.  2.BPH-urinary retention in 2014 and status post TURP by Dr. Robby Sermon with an incomplete resection. He had resection of a large intravesical median lobe with 26 g resected.  3.Erectile dysfunction.-On sildenafil   HPI: 74 y.o. male presents for annual follow-up.  He had a PSA performed with Dr. Lavera Guise in October 2020 which was stable at 3.9 (uncorrected).  He remains on finasteride and has no bothersome lower urinary tract symptoms.  He remains on sildenafil 100 mg however is cutting them in fourths and and supplementing with arginine.  He was noted to have a GFR of 47 at his physical in October and was referred to nephrology.  His evaluation was negative and a follow-up GFR was 70.  PMH: Past Medical History:  Diagnosis Date  . Aortic sclerosis 04/21/2017   Overview:  Moderate Aortic Sclerosis, mild LVH,  mild Mitral valve thickening, Slight LAE on ECHO - started  on ACE  for  LVH  . BPH with obstruction/lower urinary tract symptoms 03/19/2014  . CAD (coronary artery disease) 04/21/2017   Overview:   silent MI - Inferior; s/p CABG x 3; Stress Echo  6/05; 8/08;  2/12  . Chronic prostatitis 11/03/2000  . Coronary artery disease   . Elevated PSA 03/19/2014  . Erectile dysfunction 03/19/2014  . History of basal cell carcinoma 07/02/2015   right nose  . History of dysplastic nevus 06/22/2017   left ant deltoid  . Hyperlipidemia 04/21/2017  . Impaired glucose tolerance 04/21/2017  . OSA (obstructive sleep apnea) 04/21/2017   Overview:  rx with wt loss, side sleeping  . RLS (restless  legs syndrome) 04/21/2017    Surgical History: Past Surgical History:  Procedure Laterality Date  . ADENOIDECTOMY    . CARDIAC SURGERY     cabg 2002  . INGUINAL HERNIA REPAIR    . TRANSURETHRAL RESECTION OF PROSTATE     2014    Home Medications:  Allergies as of 05/09/2019      Reactions   Pollen Extract    Sulfa Antibiotics Other (See Comments)   Dizziness   Sulfamethoxazole-trimethoprim    dizzy dizzy   Benadryl [diphenhydramine Hcl (sleep)] Anxiety      Medication List       Accurate as of May 09, 2019  9:07 AM. If you have any questions, ask your nurse or doctor.        aspirin EC 81 MG tablet Take 81 mg by mouth.   atorvastatin 20 MG tablet Commonly known as: LIPITOR 10 mg.   atorvastatin 10 MG tablet Commonly known as: LIPITOR   clonazePAM 0.5 MG tablet Commonly known as: KLONOPIN Take 0.25 mg by mouth.   Coenzyme Q10 100 MG capsule Take 200 mg by mouth.   DOCOSAHEXAENOIC ACID PO Take by mouth.   esomeprazole 40 MG capsule Commonly known as: NEXIUM   finasteride 5 MG tablet Commonly known as: PROSCAR Take 1 tablet (5 mg total) by mouth daily.   fluticasone 50 MCG/ACT nasal spray Commonly known as: FLONASE 1 spray by Each Nare route daily.   folic acid A999333 MCG tablet Commonly known as: FOLVITE Take by mouth.  ibuprofen 200 MG tablet Commonly known as: ADVIL Take 200 mg by mouth.   lisinopril 2.5 MG tablet Commonly known as: ZESTRIL   loratadine 10 MG tablet Commonly known as: CLARITIN Take 10 mg by mouth daily.   Melatonin 3 MG Tabs Take by mouth.   naproxen sodium 220 MG tablet Commonly known as: ALEVE Take by mouth.   Niaspan 500 MG CR tablet Generic drug: niacin   nitroGLYCERIN 0.4 MG SL tablet Commonly known as: NITROSTAT Place 0.4 mg under the tongue.   Omega-3 1000 MG Caps Take by mouth.   pseudoephedrine 30 MG tablet Commonly known as: SUDAFED Take by mouth.   sildenafil 100 MG tablet Commonly known as:  VIAGRA 1 tab as needed 1 hour prior to intercourse   Toprol XL 25 MG 24 hr tablet Generic drug: metoprolol succinate   Veramyst 27.5 MCG/SPRAY nasal spray Generic drug: fluticasone Place into the nose.   vitamin C 500 MG tablet Commonly known as: ASCORBIC ACID Take 500 mg by mouth.   Vitamin D3 50 MCG (2000 UT) capsule Take by mouth.       Allergies:  Allergies  Allergen Reactions  . Pollen Extract   . Sulfa Antibiotics Other (See Comments)    Dizziness   . Sulfamethoxazole-Trimethoprim     dizzy dizzy   . Benadryl [Diphenhydramine Hcl (Sleep)] Anxiety    Family History: Family History  Problem Relation Age of Onset  . Prostate cancer Paternal Uncle   . Bladder Cancer Neg Hx   . Kidney cancer Neg Hx     Social History:  reports that he has never smoked. He has never used smokeless tobacco. He reports current alcohol use. He reports that he does not use drugs.  ROS: UROLOGY Frequent Urination?: No Hard to postpone urination?: No Burning/pain with urination?: No Get up at night to urinate?: No Leakage of urine?: No Urine stream starts and stops?: No Trouble starting stream?: No Do you have to strain to urinate?: No Blood in urine?: No Urinary tract infection?: No Sexually transmitted disease?: No Injury to kidneys or bladder?: No Painful intercourse?: No Weak stream?: No Erection problems?: Yes Penile pain?: No  Gastrointestinal Nausea?: No Vomiting?: No Indigestion/heartburn?: No Diarrhea?: No Constipation?: No  Constitutional Fever: No Night sweats?: No Weight loss?: No Fatigue?: No  Skin Skin rash/lesions?: No Itching?: No  Eyes Blurred vision?: No Double vision?: No  Ears/Nose/Throat Sore throat?: No Sinus problems?: No  Hematologic/Lymphatic Swollen glands?: No Easy bruising?: No  Cardiovascular Leg swelling?: No Chest pain?: No  Respiratory Cough?: No Shortness of breath?: No  Endocrine Excessive thirst?:  No  Musculoskeletal Back pain?: Yes Joint pain?: Yes  Neurological Headaches?: No Dizziness?: No  Psychologic Depression?: No Anxiety?: No  Physical Exam: BP (!) 148/90   Pulse 77   Ht 5\' 2"  (1.575 m)   Wt 146 lb (66.2 kg)   BMI 26.70 kg/m   Constitutional:  Alert and oriented, No acute distress. HEENT: [place] AT, moist mucus membranes.  Trachea midline, no masses. Cardiovascular: No clubbing, cyanosis, or edema. Respiratory: Normal respiratory effort, no increased work of breathing. GU: Prostate 60+ cc, smooth without nodules Skin: No rashes, bruises or suspicious lesions. Neurologic: Grossly intact, no focal deficits, moving all 4 extremities. Psychiatric: Normal mood and affect.   Assessment & Plan:    - BPH with obstruction/lower urinary tract symptoms He has no bothersome lower urinary tract symptoms on finasteride.  PVR by bladder scan today was 0 mL.  - Elevated  PSA Improved on finasteride  - Erectile dysfunction He was informed he may get a better price on sildenafil 100 mg with good Rx coupon and pay out-of-pocket.  Will check pricing and let him know.  Continue annual follow-up   [name], MD  [facility] [address] [place], [place] [address] [phone]

## 2019-05-10 ENCOUNTER — Other Ambulatory Visit: Payer: Self-pay | Admitting: *Deleted

## 2019-05-10 MED ORDER — SILDENAFIL CITRATE 100 MG PO TABS: ORAL_TABLET | ORAL | 0 refills | Status: DC

## 2019-07-30 ENCOUNTER — Ambulatory Visit (INDEPENDENT_AMBULATORY_CARE_PROVIDER_SITE_OTHER): Payer: Medicare PPO | Admitting: Dermatology

## 2019-07-30 ENCOUNTER — Other Ambulatory Visit: Payer: Self-pay

## 2019-07-30 ENCOUNTER — Encounter: Payer: Self-pay | Admitting: Dermatology

## 2019-07-30 DIAGNOSIS — Z86018 Personal history of other benign neoplasm: Secondary | ICD-10-CM

## 2019-07-30 DIAGNOSIS — D229 Melanocytic nevi, unspecified: Secondary | ICD-10-CM

## 2019-07-30 DIAGNOSIS — L570 Actinic keratosis: Secondary | ICD-10-CM | POA: Diagnosis not present

## 2019-07-30 DIAGNOSIS — Z85828 Personal history of other malignant neoplasm of skin: Secondary | ICD-10-CM

## 2019-07-30 DIAGNOSIS — L821 Other seborrheic keratosis: Secondary | ICD-10-CM

## 2019-07-30 DIAGNOSIS — L820 Inflamed seborrheic keratosis: Secondary | ICD-10-CM

## 2019-07-30 DIAGNOSIS — L578 Other skin changes due to chronic exposure to nonionizing radiation: Secondary | ICD-10-CM

## 2019-07-30 DIAGNOSIS — Z1283 Encounter for screening for malignant neoplasm of skin: Secondary | ICD-10-CM | POA: Diagnosis not present

## 2019-07-30 DIAGNOSIS — B351 Tinea unguium: Secondary | ICD-10-CM | POA: Diagnosis not present

## 2019-07-30 DIAGNOSIS — D1800 Hemangioma unspecified site: Secondary | ICD-10-CM

## 2019-07-30 DIAGNOSIS — L609 Nail disorder, unspecified: Secondary | ICD-10-CM

## 2019-07-30 MED ORDER — TAVABOROLE 5 % EX SOLN: 1.0000 "application " | Freq: Every day | CUTANEOUS | 2 refills | Status: DC

## 2019-07-30 NOTE — Patient Instructions (Signed)

## 2019-07-30 NOTE — Progress Notes (Signed)
Follow-Up Visit   Subjective  Bruce Gomez is a 74 y.o. male who presents for the following: Annual Exam (UBSE, Hx of BCC, Hx of Dysplastic nevus) and Nail Problem (toenail and fingernail discoloration for several months, no treatment).  The following portions of the chart were reviewed this encounter and updated as appropriate:  Tobacco  Allergies  Meds  Problems  Med Hx  Surg Hx  Fam Hx      Review of Systems:  No other skin or systemic complaints except as noted in HPI or Assessment and Plan.  Objective  Well appearing patient in no apparent distress; mood and affect are within normal limits.  All skin waist up examined. Toenails  Objective  face,scalp,ears (21): Erythematous thin papules/macules with gritty scale.   Objective  Right ant deltoid: Scar with no evidence of recurrence.   Objective  face,scalp,ears (7): Erythematous keratotic or waxy stuck-on papule or plaque.   Objective  Right medial nail fold: Discoloration   Objective  R middle toenail: White area   Assessment & Plan  AK (actinic keratosis) (21) face,scalp,ears  Destruction of lesion - face,scalp,ears Complexity: simple   Destruction method: cryotherapy   Informed consent: discussed and consent obtained   Timeout:  patient name, date of birth, surgical site, and procedure verified Lesion destroyed using liquid nitrogen: Yes   Region frozen until ice ball extended beyond lesion: Yes   Outcome: patient tolerated procedure well with no complications   Post-procedure details: wound care instructions given    History of dysplastic nevus Right ant deltoid  The patient will observe.   Inflamed seborrheic keratosis (7) face,scalp,ears  Destruction of lesion - face,scalp,ears Complexity: simple   Destruction method: cryotherapy   Informed consent: discussed and consent obtained   Timeout:  patient name, date of birth, surgical site, and procedure verified Lesion destroyed using  liquid nitrogen: Yes   Region frozen until ice ball extended beyond lesion: Yes   Outcome: patient tolerated procedure well with no complications   Post-procedure details: wound care instructions given    Nail disorder Right medial nail fold  Likely related to Trauma , possible blood under the nail  This will eventually grow out, recheck in 6 months   Tinea unguium R middle toenail  Superficial white Tinea Unguium   Start Kerydin solution apply to nail qhs  If [employer] is not covered we will change to [facility]   Tavaborole (KERYDIN) 5 % SOLN - R middle toenail Actinic Damage - diffuse scaly erythematous macules with underlying dyspigmentation - Recommend daily broad spectrum sunscreen SPF 30+ to sun-exposed areas, reapply every 2 hours as needed.  - Call for new or changing lesions. May consider PDT in the future. Seborrheic Keratoses - Stuck-on, waxy, tan-brown papules and plaques  - Discussed benign etiology and prognosis. - Observe - Call for any changes Melanocytic Nevi - Tan-brown and/or pink-flesh-colored symmetric macules and papules - Benign appearing on exam today - Observation - Call clinic for new or changing moles - Recommend daily use of broad spectrum spf 30+ sunscreen to sun-exposed areas.  Hemangiomas - Red papules - Discussed benign nature - Observe - Call for any changes History of Basal Cell Carcinoma of the Skin R nose - No evidence of recurrence today - Recommend regular full body skin exams - Recommend daily broad spectrum sunscreen SPF 30+ to sun-exposed areas, reapply every 2 hours as needed.  - Call if any new or changing lesions are noted between office visits   Return  in about 6 months (around 01/29/2020). IMarye Round, CMA, am acting as scribe for Sarina Ser, MD .  Documentation: I have reviewed the above documentation for accuracy and completeness, and I agree with the above.  Sarina Ser, MD

## 2019-08-01 ENCOUNTER — Other Ambulatory Visit: Payer: Self-pay

## 2019-08-01 MED ORDER — JUBLIA 10 % EX SOLN: CUTANEOUS | 2 refills | Status: DC

## 2019-08-01 NOTE — Telephone Encounter (Signed)
Pt called [employer] is not covered by insurance,   Per pts last note [facility] erx'd to [employer]

## 2019-08-09 ENCOUNTER — Other Ambulatory Visit: Payer: Self-pay

## 2019-08-09 MED ORDER — CICLOPIROX OLAMINE 0.77 % EX SUSP: 1.0000 "application " | Freq: Every day | CUTANEOUS | 2 refills | Status: DC

## 2019-08-22 ENCOUNTER — Ambulatory Visit (INDEPENDENT_AMBULATORY_CARE_PROVIDER_SITE_OTHER): Payer: Medicare PPO | Admitting: Internal Medicine

## 2019-08-22 ENCOUNTER — Other Ambulatory Visit: Payer: Self-pay

## 2019-08-22 DIAGNOSIS — N289 Disorder of kidney and ureter, unspecified: Secondary | ICD-10-CM | POA: Diagnosis not present

## 2019-08-23 LAB — COMPLETE METABOLIC PANEL WITH GFR
AG Ratio: 1.5 (calc) (ref 1.0–2.5)
ALT: 20 U/L (ref 9–46)
AST: 20 U/L (ref 10–35)
Albumin: 4 g/dL (ref 3.6–5.1)
Alkaline phosphatase (APISO): 81 U/L (ref 35–144)
BUN: 16 mg/dL (ref 7–25)
CO2: 30 mmol/L (ref 20–32)
Calcium: 9.8 mg/dL (ref 8.6–10.3)
Chloride: 101 mmol/L (ref 98–110)
Creat: 1.12 mg/dL (ref 0.70–1.18)
GFR, Est African American: 75 mL/min/{1.73_m2} (ref >=60)
GFR, Est Non African American: 64 mL/min/{1.73_m2} (ref >=60)
Globulin: 2.7 g/dL (calc) (ref 1.9–3.7)
Glucose, Bld: 71 mg/dL (ref 65–99)
Potassium: 4.6 mmol/L (ref 3.5–5.3)
Sodium: 137 mmol/L (ref 135–146)
Total Bilirubin: 0.4 mg/dL (ref 0.2–1.2)
Total Protein: 6.7 g/dL (ref 6.1–8.1)

## 2019-08-30 ENCOUNTER — Ambulatory Visit (INDEPENDENT_AMBULATORY_CARE_PROVIDER_SITE_OTHER): Payer: Medicare PPO | Admitting: Internal Medicine

## 2019-08-30 ENCOUNTER — Other Ambulatory Visit: Payer: Self-pay

## 2019-08-30 ENCOUNTER — Encounter: Payer: Self-pay | Admitting: Internal Medicine

## 2019-08-30 VITALS — BP 132/74 | HR 68 | Wt 153.2 lb

## 2019-08-30 DIAGNOSIS — I10 Essential (primary) hypertension: Secondary | ICD-10-CM | POA: Diagnosis not present

## 2019-08-30 DIAGNOSIS — N138 Other obstructive and reflux uropathy: Secondary | ICD-10-CM

## 2019-08-30 DIAGNOSIS — G2581 Restless legs syndrome: Secondary | ICD-10-CM

## 2019-08-30 DIAGNOSIS — R7302 Impaired glucose tolerance (oral): Secondary | ICD-10-CM

## 2019-08-30 DIAGNOSIS — G4733 Obstructive sleep apnea (adult) (pediatric): Secondary | ICD-10-CM

## 2019-08-30 DIAGNOSIS — N401 Enlarged prostate with lower urinary tract symptoms: Secondary | ICD-10-CM | POA: Diagnosis not present

## 2019-08-30 MED ORDER — LISINOPRIL 10 MG PO TABS: 10.0000 mg | ORAL_TABLET | Freq: Every day | ORAL | 3 refills | Status: DC

## 2019-08-30 NOTE — Progress Notes (Signed)
Established Patient Office Visit  Subjective:  Patient ID: Bruce Gomez, male    DOB: 09-Aug-1945  Age: 74 y.o. MRN: [id number]  CC:  Chief Complaint  Patient presents with  . Lab results    Patient here today for 6 month fu with labs results     HPI  Bruce Gomez presents for checkup on hypertension.  He is known to have coronary artery disease , problem with snoring.  And he has had TUR for the BPH doing well now.  Restless syndrome is not bothering him much at the present time.  Past Medical History:  Diagnosis Date  . Aortic sclerosis 04/21/2017   Overview:  Moderate Aortic Sclerosis, mild LVH,  mild Mitral valve thickening, Slight LAE on ECHO - started  on ACE  for  LVH  . BPH with obstruction/lower urinary tract symptoms 03/19/2014  . CAD (coronary artery disease) 04/21/2017   Overview:   silent MI - Inferior; s/p CABG x 3; Stress Echo  6/05; 8/08;  2/12  . Chronic prostatitis 11/03/2000  . Coronary artery disease   . Elevated PSA 03/19/2014  . Erectile dysfunction 03/19/2014  . History of basal cell carcinoma 07/02/2015   right nose  . History of dysplastic nevus 06/22/2017   left ant deltoid  . Hyperlipidemia 04/21/2017  . Impaired glucose tolerance 04/21/2017  . OSA (obstructive sleep apnea) 04/21/2017   Overview:  rx with wt loss, side sleeping  . RLS (restless legs syndrome) 04/21/2017    Past Surgical History:  Procedure Laterality Date  . ADENOIDECTOMY    . CARDIAC SURGERY     cabg 2002  . INGUINAL HERNIA REPAIR    . TRANSURETHRAL RESECTION OF PROSTATE     2014    Family History  Problem Relation Age of Onset  . Prostate cancer Paternal Uncle   . Bladder Cancer Neg Hx   . Kidney cancer Neg Hx     Social History   Socioeconomic History  . Marital status: Married    Spouse name: Not on file  . Number of children: Not on file  . Years of education: Not on file  . Highest education level: Not on file  Occupational History  . Not on file  Tobacco  Use  . Smoking status: Never Smoker  . Smokeless tobacco: Never Used  Substance and Sexual Activity  . Alcohol use: Yes    Comment: ocassional glass of wine   . Drug use: No  . Sexual activity: Yes    Birth control/protection: None  Other Topics Concern  . Not on file  Social History Narrative  . Not on file   Social Determinants of Health   Financial Resource Strain:   . Difficulty of Paying Living Expenses:   Food Insecurity:   . Worried About Charity fundraiser in the Last Year:   . Arboriculturist in the Last Year:   Transportation Needs:   . Film/video editor (Medical):   Marland Kitchen Lack of Transportation (Non-Medical):   Physical Activity:   . Days of Exercise per Week:   . Minutes of Exercise per Session:   Stress:   . Feeling of Stress :   Social Connections:   . Frequency of Communication with Friends and Family:   . Frequency of Social Gatherings with Friends and Family:   . Attends Religious Services:   . Active Member of Clubs or Organizations:   . Attends Archivist Meetings:   Marland Kitchen Marital Status:  Intimate Partner Violence:   . Fear of Current or Ex-Partner:   . Emotionally Abused:   Marland Kitchen Physically Abused:   . Sexually Abused:      Current Outpatient Medications:  .  aspirin EC 81 MG tablet, Take 81 mg by mouth., Disp: , Rfl:  .  atorvastatin (LIPITOR) 20 MG tablet, 10 mg., Disp: , Rfl:  .  Cholecalciferol (VITAMIN D3) 2000 units capsule, Take by mouth., Disp: , Rfl:  .  clonazePAM (KLONOPIN) 0.5 MG tablet, Take 0.25 mg by mouth., Disp: , Rfl:  .  Coenzyme Q10 100 MG capsule, Take 200 mg by mouth., Disp: , Rfl:  .  Efinaconazole (JUBLIA) 10 % SOLN, Apply to nails qd, Disp: 5 mL, Rfl: 2 .  esomeprazole (NEXIUM) 40 MG capsule, , Disp: , Rfl:  .  finasteride (PROSCAR) 5 MG tablet, Take 1 tablet (5 mg total) by mouth daily., Disp: 90 tablet, Rfl: 3 .  fluticasone (FLONASE) 50 MCG/ACT nasal spray, 1 spray by Each Nare route daily., Disp: , Rfl:  .   folic acid (FOLVITE) A999333 MCG tablet, Take by mouth., Disp: , Rfl:  .  lisinopril (PRINIVIL,ZESTRIL) 2.5 MG tablet, , Disp: , Rfl:  .  loratadine (CLARITIN) 10 MG tablet, Take 10 mg by mouth daily., Disp: , Rfl:  .  Melatonin 3 MG TABS, Take by mouth., Disp: , Rfl:  .  NIASPAN 500 MG CR tablet, , Disp: , Rfl:  .  Omega-3 1000 MG CAPS, Take by mouth., Disp: , Rfl:  .  pseudoephedrine (SUDAFED) 30 MG tablet, Take by mouth., Disp: , Rfl:  .  sildenafil (VIAGRA) 100 MG tablet, 1 tab as needed 1 hour prior to intercourse, Disp: 90 tablet, Rfl: 0 .  TOPROL XL 25 MG 24 hr tablet, , Disp: , Rfl:  .  vitamin C (ASCORBIC ACID) 500 MG tablet, Take 500 mg by mouth., Disp: , Rfl:    Allergies  Allergen Reactions  . Pollen Extract   . Sulfa Antibiotics Other (See Comments)    Dizziness   . Sulfamethoxazole-Trimethoprim     dizzy dizzy   . Benadryl [Diphenhydramine Hcl (Sleep)] Anxiety    ROS Review of Systems  Constitutional: Negative.   HENT: Negative.   Eyes: Negative.   Respiratory: Negative.   Cardiovascular: Negative.   Gastrointestinal: Negative.   Genitourinary: Negative for difficulty urinating.  Musculoskeletal: Negative for back pain.  Neurological: Negative for dizziness.  Psychiatric/Behavioral: Negative for behavioral problems.      Objective:    Physical Exam  Constitutional: He appears well-developed and well-nourished.  HENT:  Head: Normocephalic and atraumatic.  Eyes: Pupils are equal, round, and reactive to light. Conjunctivae are normal.  Cardiovascular: Normal rate, regular rhythm and normal heart sounds.  No murmur heard. Pulmonary/Chest: Effort normal.  Abdominal: Soft.  Musculoskeletal:        General: No edema. Normal range of motion.     Cervical back: Normal range of motion.  Neurological: He is alert.  Skin: Skin is warm.    BP 132/74   Pulse 68   Wt 153 lb 3.2 oz (69.5 kg)   BMI 28.02 kg/m  Wt Readings from Last 3 Encounters:  08/30/19 153  lb 3.2 oz (69.5 kg)  05/09/19 146 lb (66.2 kg)  05/02/18 172 lb 1.6 oz (78.1 kg)     Health Maintenance Due  Topic Date Due  . COVID-19 Vaccine (1) Never done  . COLONOSCOPY  Never done  . PNA vac Low Risk  Adult (2 of 2 - PCV13) 04/21/2011    There are no preventive care reminders to display for this patient.  No results found for: TSH Lab Results  Component Value Date   WBC 10.3 01/15/2016   HGB 14.2 01/15/2016   HCT 40.0 01/15/2016   MCV 94.0 01/15/2016   PLT 170 01/15/2016   Lab Results  Component Value Date   NA 137 08/22/2019   K 4.6 08/22/2019   CO2 30 08/22/2019   GLUCOSE 71 08/22/2019   BUN 16 08/22/2019   CREATININE 1.12 08/22/2019   BILITOT 0.4 08/22/2019   ALKPHOS 85 10/13/2017   AST 20 08/22/2019   ALT 20 08/22/2019   PROT 6.7 08/22/2019   ALBUMIN 4.3 10/13/2017   CALCIUM 9.8 08/22/2019   ANIONGAP 9 01/15/2016   No results found for: CHOL No results found for: HDL No results found for: LDLCALC No results found for: TRIG No results found for: CHOLHDL No results found for: HGBA1C    Assessment & Plan:   Problem List Items Addressed This Visit      Cardiovascular and Mediastinum   Essential hypertension - Primary    labile        Respiratory   OSA (obstructive sleep apnea)    stable        Endocrine   Impaired glucose tolerance    Low sugar diet        Genitourinary   BPH with obstruction/lower urinary tract symptoms     Other   RLS (restless legs syndrome)    resolved         No orders of the defined types were placed in this encounter.  1. Essential hypertension Stable at the present time.  2. OSA (obstructive sleep apnea) Stable at the present time.  3. RLS (restless legs syndrome) Stable  4. BPH with obstruction/lower urinary tract symptoms Patient has surgery on the prostate in 2014.  5. Impaired glucose tolerance She is swallowing and controlling on the low sugar diet. Follow-up: Return in about 3 months  (around 11/30/2019).    Cletis Athens, MD

## 2019-08-30 NOTE — Assessment & Plan Note (Signed)
resolved 

## 2019-08-30 NOTE — Assessment & Plan Note (Signed)
labile

## 2019-08-30 NOTE — Assessment & Plan Note (Signed)
Low sugar diet

## 2019-08-30 NOTE — Assessment & Plan Note (Signed)
stable °

## 2019-08-31 MED ORDER — LISINOPRIL 2.5 MG PO TABS: 2.5000 mg | ORAL_TABLET | Freq: Every day | ORAL | 3 refills | Status: DC

## 2019-08-31 NOTE — Addendum Note (Signed)
Addended by: Alois Cliche on: 08/31/2019 09:28 AM   Modules accepted: Orders

## 2019-09-06 ENCOUNTER — Other Ambulatory Visit: Payer: Self-pay | Admitting: *Deleted

## 2019-09-06 DIAGNOSIS — I10 Essential (primary) hypertension: Secondary | ICD-10-CM

## 2019-09-06 MED ORDER — LISINOPRIL 10 MG PO TABS: 10.0000 mg | ORAL_TABLET | Freq: Every day | ORAL | 3 refills | Status: DC

## 2019-09-13 ENCOUNTER — Other Ambulatory Visit: Payer: Self-pay | Admitting: *Deleted

## 2019-11-06 ENCOUNTER — Other Ambulatory Visit: Payer: Self-pay | Admitting: Urology

## 2019-11-15 ENCOUNTER — Other Ambulatory Visit: Payer: Self-pay

## 2019-11-20 ENCOUNTER — Other Ambulatory Visit: Payer: Self-pay

## 2019-11-20 DIAGNOSIS — Z1211 Encounter for screening for malignant neoplasm of colon: Secondary | ICD-10-CM

## 2019-11-28 ENCOUNTER — Other Ambulatory Visit: Payer: Self-pay

## 2019-11-28 ENCOUNTER — Telehealth (INDEPENDENT_AMBULATORY_CARE_PROVIDER_SITE_OTHER): Payer: Self-pay | Admitting: Gastroenterology

## 2019-11-28 DIAGNOSIS — Z1211 Encounter for screening for malignant neoplasm of colon: Secondary | ICD-10-CM

## 2019-11-28 MED ORDER — NA SULFATE-K SULFATE-MG SULF 17.5-3.13-1.6 GM/177ML PO SOLN: 1.0000 | Freq: Once | ORAL | 0 refills | Status: AC

## 2019-11-28 NOTE — Progress Notes (Signed)
Gastroenterology Pre-Procedure Review  Request Date: Friday 12/14/19 Requesting Physician: Dr. Allen Norris  PATIENT REVIEW QUESTIONS: The patient responded to the following health history questions as indicated:    1. Are you having any GI issues? no 2. Do you have a personal history of Polyps? yes (2011 colonoscopy in [facility] colon polyps were present.  2016 colonoscopy was normal.) 3. Do you have a family history of Colon Cancer or Polyps? no 4. Diabetes Mellitus? no 5. Joint replacements in the past 12 months?no 6. Major health problems in the past 3 months?no 7. Any artificial heart valves, MVP, or defibrillator?no    MEDICATIONS & ALLERGIES:    Patient reports the following regarding taking any anticoagulation/antiplatelet therapy:   Plavix, Coumadin, Eliquis, Xarelto, Lovenox, Pradaxa, Brilinta, or Effient? Aspirin? yes (81 mg daily)  Patient confirms/reports the following medications:  Current Outpatient Medications  Medication Sig Dispense Refill  . aspirin EC 81 MG tablet Take 81 mg by mouth.    Marland Kitchen atorvastatin (LIPITOR) 20 MG tablet 10 mg.    . B Complex Vitamins (B COMPLEX 1 PO) Take by mouth. Takes 1 a week    . calcium carbonate (OSCAL) 1500 (600 Ca) MG TABS tablet Take by mouth.    . Cholecalciferol (VITAMIN D3) 2000 units capsule Take by mouth.    . clonazePAM (KLONOPIN) 0.5 MG tablet Take 0.25 mg by mouth.    . Coenzyme Q10 100 MG capsule Take 200 mg by mouth.    . esomeprazole (NEXIUM) 40 MG capsule     . finasteride (PROSCAR) 5 MG tablet Take 1 tablet (5 mg total) by mouth daily. 90 tablet 3  . folic acid (FOLVITE) 834 MCG tablet Take by mouth.    Marland Kitchen lisinopril (ZESTRIL) 10 MG tablet Take 1 tablet (10 mg total) by mouth daily. 90 tablet 3  . loratadine (CLARITIN) 10 MG tablet Take 10 mg by mouth daily.    . Melatonin 3 MG TABS Take by mouth.    Marland Kitchen NIASPAN 500 MG CR tablet     . Omega-3 1000 MG CAPS Take by mouth.    . pseudoephedrine (SUDAFED) 30 MG tablet Take by  mouth.    . sildenafil (VIAGRA) 100 MG tablet TAKE ONE TABLET BY MOUTH ONE HOUR PRIOR TO INTERCOURSE AS NEEDED 90 tablet 0  . TOPROL XL 25 MG 24 hr tablet     . vitamin C (ASCORBIC ACID) 500 MG tablet Take 500 mg by mouth.    . Na Sulfate-K Sulfate-Mg Sulf 17.5-3.13-1.6 GM/177ML SOLN Take 1 kit by mouth once for 1 dose. 354 mL 0   No current facility-administered medications for this visit.    Patient confirms/reports the following allergies:  Allergies  Allergen Reactions  . Pollen Extract   . Sulfa Antibiotics Other (See Comments)    Dizziness   . Sulfamethoxazole-Trimethoprim     dizzy dizzy   . Benadryl [Diphenhydramine Hcl (Sleep)] Anxiety    No orders of the defined types were placed in this encounter.   AUTHORIZATION INFORMATION Primary Insurance: 1D#: Group #:  Secondary Insurance: 1D#: Group #:  SCHEDULE INFORMATION: Date: Friday 12/14/19 Time: Location:[facility]

## 2019-12-04 ENCOUNTER — Telehealth: Payer: Self-pay | Admitting: Gastroenterology

## 2019-12-04 NOTE — Telephone Encounter (Signed)
Patient wants a call back..he has some questions about procedure..Clinical staff was informed.

## 2019-12-06 ENCOUNTER — Encounter: Payer: Self-pay | Admitting: Gastroenterology

## 2019-12-06 ENCOUNTER — Other Ambulatory Visit: Payer: Self-pay

## 2019-12-12 ENCOUNTER — Other Ambulatory Visit
Admission: RE | Admit: 2019-12-12 | Discharge: 2019-12-12 | Disposition: A | Discharge status: Home / Self Care | Payer: Medicare PPO | Source: Ambulatory Visit | Attending: Gastroenterology | Admitting: Gastroenterology

## 2019-12-12 ENCOUNTER — Other Ambulatory Visit: Payer: Self-pay

## 2019-12-12 DIAGNOSIS — Z20822 Contact with and (suspected) exposure to covid-19: Secondary | ICD-10-CM | POA: Diagnosis not present

## 2019-12-12 DIAGNOSIS — Z01812 Encounter for preprocedural laboratory examination: Secondary | ICD-10-CM | POA: Diagnosis not present

## 2019-12-12 LAB — SARS CORONAVIRUS 2 (TAT 6-24 HRS): SARS Coronavirus 2: NEGATIVE

## 2019-12-13 NOTE — Discharge Instructions (Signed)
General Anesthesia, Adult, Care After This sheet gives you information about how to care for yourself after your procedure. Your health care provider may also give you more specific instructions. If you have problems or questions, contact your health care provider. What can I expect after the procedure? After the procedure, the following side effects are common:  Pain or discomfort at the IV site.  Nausea.  Vomiting.  Sore throat.  Trouble concentrating.  Feeling cold or chills.  Weak or tired.  Sleepiness and fatigue.  Soreness and body aches. These side effects can affect parts of the body that were not involved in surgery. Follow these instructions at home:  For at least 24 hours after the procedure:  Have a responsible adult stay with you. It is important to have someone help care for you until you are awake and alert.  Rest as needed.  Do not: ? Participate in activities in which you could fall or become injured. ? Drive. ? Use heavy machinery. ? Drink alcohol. ? Take sleeping pills or medicines that cause drowsiness. ? Make important decisions or sign legal documents. ? Take care of children on your own. Eating and drinking  Follow any instructions from your health care provider about eating or drinking restrictions.  When you feel hungry, start by eating small amounts of foods that are soft and easy to digest (bland), such as toast. Gradually return to your regular diet.  Drink enough fluid to keep your urine pale yellow.  If you vomit, rehydrate by drinking water, juice, or clear broth. General instructions  If you have sleep apnea, surgery and certain medicines can increase your risk for breathing problems. Follow instructions from your health care provider about wearing your sleep device: ? Anytime you are sleeping, including during daytime naps. ? While taking prescription pain medicines, sleeping medicines, or medicines that make you drowsy.  Return to  your normal activities as told by your health care provider. Ask your health care provider what activities are safe for you.  Take over-the-counter and prescription medicines only as told by your health care provider.  If you smoke, do not smoke without supervision.  Keep all follow-up visits as told by your health care provider. This is important. Contact a health care provider if:  You have nausea or vomiting that does not get better with medicine.  You cannot eat or drink without vomiting.  You have pain that does not get better with medicine.  You are unable to pass urine.  You develop a skin rash.  You have a fever.  You have redness around your IV site that gets worse. Get help right away if:  You have difficulty breathing.  You have chest pain.  You have blood in your urine or stool, or you vomit blood. Summary  After the procedure, it is common to have a sore throat or nausea. It is also common to feel tired.  Have a responsible adult stay with you for the first 24 hours after general anesthesia. It is important to have someone help care for you until you are awake and alert.  When you feel hungry, start by eating small amounts of foods that are soft and easy to digest (bland), such as toast. Gradually return to your regular diet.  Drink enough fluid to keep your urine pale yellow.  Return to your normal activities as told by your health care provider. Ask your health care provider what activities are safe for you. This information is not   intended to replace advice given to you by your health care provider. Make sure you discuss any questions you have with your health care provider. Document Revised: 03/25/2017 Document Reviewed: 11/05/2016 Elsevier Patient Education  2020 Elsevier Inc.  

## 2019-12-14 ENCOUNTER — Other Ambulatory Visit: Payer: Self-pay

## 2019-12-14 ENCOUNTER — Encounter
Admission: RE | Disposition: A | Discharge status: Home / Self Care | Payer: Self-pay | Source: Home / Self Care | Attending: Gastroenterology

## 2019-12-14 ENCOUNTER — Encounter: Payer: Self-pay | Admitting: Gastroenterology

## 2019-12-14 ENCOUNTER — Ambulatory Visit: Payer: Medicare PPO | Admitting: Anesthesiology

## 2019-12-14 ENCOUNTER — Ambulatory Visit
Admission: RE | Admit: 2019-12-14 | Discharge: 2019-12-14 | Disposition: A | Discharge status: Home / Self Care | Payer: Medicare PPO | Attending: Gastroenterology | Admitting: Gastroenterology

## 2019-12-14 DIAGNOSIS — Z860100 Personal history of colon polyps, unspecified: Secondary | ICD-10-CM

## 2019-12-14 DIAGNOSIS — I252 Old myocardial infarction: Secondary | ICD-10-CM | POA: Diagnosis not present

## 2019-12-14 DIAGNOSIS — Z7982 Long term (current) use of aspirin: Secondary | ICD-10-CM | POA: Insufficient documentation

## 2019-12-14 DIAGNOSIS — D122 Benign neoplasm of ascending colon: Secondary | ICD-10-CM | POA: Diagnosis not present

## 2019-12-14 DIAGNOSIS — Z882 Allergy status to sulfonamides status: Secondary | ICD-10-CM | POA: Diagnosis not present

## 2019-12-14 DIAGNOSIS — Z1211 Encounter for screening for malignant neoplasm of colon: Secondary | ICD-10-CM | POA: Diagnosis not present

## 2019-12-14 DIAGNOSIS — K635 Polyp of colon: Secondary | ICD-10-CM

## 2019-12-14 DIAGNOSIS — Z85828 Personal history of other malignant neoplasm of skin: Secondary | ICD-10-CM | POA: Insufficient documentation

## 2019-12-14 DIAGNOSIS — I10 Essential (primary) hypertension: Secondary | ICD-10-CM | POA: Diagnosis not present

## 2019-12-14 DIAGNOSIS — E785 Hyperlipidemia, unspecified: Secondary | ICD-10-CM | POA: Insufficient documentation

## 2019-12-14 DIAGNOSIS — Z951 Presence of aortocoronary bypass graft: Secondary | ICD-10-CM | POA: Diagnosis not present

## 2019-12-14 DIAGNOSIS — I2510 Atherosclerotic heart disease of native coronary artery without angina pectoris: Secondary | ICD-10-CM | POA: Diagnosis not present

## 2019-12-14 DIAGNOSIS — K5730 Diverticulosis of large intestine without perforation or abscess without bleeding: Secondary | ICD-10-CM | POA: Insufficient documentation

## 2019-12-14 DIAGNOSIS — M479 Spondylosis, unspecified: Secondary | ICD-10-CM | POA: Insufficient documentation

## 2019-12-14 DIAGNOSIS — K640 First degree hemorrhoids: Secondary | ICD-10-CM | POA: Insufficient documentation

## 2019-12-14 DIAGNOSIS — Z888 Allergy status to other drugs, medicaments and biological substances status: Secondary | ICD-10-CM | POA: Insufficient documentation

## 2019-12-14 DIAGNOSIS — Z79899 Other long term (current) drug therapy: Secondary | ICD-10-CM | POA: Insufficient documentation

## 2019-12-14 DIAGNOSIS — M160 Bilateral primary osteoarthritis of hip: Secondary | ICD-10-CM | POA: Insufficient documentation

## 2019-12-14 HISTORY — PX: COLONOSCOPY WITH PROPOFOL: SHX5780

## 2019-12-14 HISTORY — DX: Unspecified osteoarthritis, unspecified site: M19.90

## 2019-12-14 HISTORY — PX: POLYPECTOMY: SHX5525

## 2019-12-14 HISTORY — DX: Essential (primary) hypertension: I10

## 2019-12-14 SURGERY — COLONOSCOPY WITH PROPOFOL
Anesthesia: General | Site: Rectum

## 2019-12-14 MED ORDER — STERILE WATER FOR IRRIGATION IR SOLN
Status: DC | PRN
  Administered 2019-12-14: .05 mL

## 2019-12-14 MED ORDER — LIDOCAINE HCL (CARDIAC) PF 100 MG/5ML IV SOSY
PREFILLED_SYRINGE | INTRAVENOUS | Status: DC | PRN
  Administered 2019-12-14: 50 mg via INTRAVENOUS

## 2019-12-14 MED ORDER — PROPOFOL 10 MG/ML IV BOLUS
INTRAVENOUS | Status: DC | PRN
  Administered 2019-12-14: 100 mg via INTRAVENOUS
  Administered 2019-12-14 (×2): 50 mg via INTRAVENOUS

## 2019-12-14 MED ORDER — LACTATED RINGERS IV SOLN: INTRAVENOUS | Status: DC

## 2019-12-14 SURGICAL SUPPLY — 8 items
FORCEPS BIOP RAD 4 LRG CAP 4 (CUTTING FORCEPS) ×4 IMPLANT
GOWN CVR UNV OPN BCK APRN NK (MISCELLANEOUS) ×4 IMPLANT
GOWN ISOL THUMB LOOP REG UNIV (MISCELLANEOUS) ×8
KIT ENDO PROCEDURE OLY (KITS) ×4 IMPLANT
MANIFOLD NEPTUNE II (INSTRUMENTS) ×4 IMPLANT
SNARE SHORT THROW 13M SML OVAL (MISCELLANEOUS) ×4 IMPLANT
TRAP ETRAP POLY (MISCELLANEOUS) ×4 IMPLANT
WATER STERILE IRR 250ML POUR (IV SOLUTION) ×4 IMPLANT

## 2019-12-14 NOTE — H&P (Signed)
Lucilla Lame, MD [address] [place], [place] [address] Phone:[phone] Fax : [phone]  Primary Care Physician:  Cletis Athens, MD Primary Gastroenterologist:  Dr. Allen Norris  Pre-Procedure History & Physical: HPI:  Bruce Gomez is a 74 y.o. male is here for an colonoscopy.   Past Medical History:  Diagnosis Date  . Aortic sclerosis 04/21/2017   Overview:  Moderate Aortic Sclerosis, mild LVH,  mild Mitral valve thickening, Slight LAE on ECHO - started  on ACE  for  LVH  . Arthritis    hips and lower back  . BPH with obstruction/lower urinary tract symptoms 03/19/2014  . CAD (coronary artery disease) 04/21/2017   Overview:   silent MI - Inferior; s/p CABG x 3; Stress Echo  6/05; 8/08;  2/12  . Chronic prostatitis 11/03/2000  . Coronary artery disease   . Elevated PSA 03/19/2014  . Erectile dysfunction 03/19/2014  . History of basal cell carcinoma 07/02/2015   right nose  . History of dysplastic nevus 06/22/2017   left ant deltoid  . Hyperlipidemia 04/21/2017  . Hypertension   . Impaired glucose tolerance 04/21/2017  . Myocardial infarction ([place]) 2002  . OSA (obstructive sleep apnea) 04/21/2017   Overview:  rx with wt loss, side sleeping-no CPAP, not true sleep apnea  . RLS (restless legs syndrome) 04/21/2017    Past Surgical History:  Procedure Laterality Date  . ADENOIDECTOMY    . CARDIAC SURGERY     cabg 2002  . CORONARY ARTERY BYPASS GRAFT  2002   x3  . INGUINAL HERNIA REPAIR    . TRANSURETHRAL RESECTION OF PROSTATE     2014    Prior to Admission medications   Medication Sig Start Date End Date Taking? Authorizing Provider  aspirin EC 81 MG tablet Take 81 mg by mouth.   Yes [provider]  atorvastatin (LIPITOR) 20 MG tablet 10 mg. 10/10/12  Yes [provider]  B Complex Vitamins (B COMPLEX 1 PO) Take by mouth. Takes 1 a week   Yes [provider]  calcium carbonate (OSCAL) 1500 (600 Ca) MG TABS tablet Take by mouth.    Yes [provider]  Cholecalciferol (VITAMIN D3) 2000 units capsule Take by mouth.   Yes [provider]  clonazePAM (KLONOPIN) 0.5 MG tablet Take 0.25 mg by mouth. 10/10/12  Yes [provider]  Coenzyme Q10 100 MG capsule Take 200 mg by mouth.   Yes [provider]  esomeprazole (NEXIUM) 40 MG capsule  07/27/17  Yes [provider]  finasteride (PROSCAR) 5 MG tablet Take 1 tablet (5 mg total) by mouth daily. 04/25/19  Yes Stoioff, Ronda Fairly, MD  folic acid (FOLVITE) 585 MCG tablet Take by mouth.   Yes [provider]  lisinopril (ZESTRIL) 10 MG tablet Take 1 tablet (10 mg total) by mouth daily. 09/06/19  Yes Masoud, Viann Shove, MD  loratadine (CLARITIN) 10 MG tablet Take 10 mg by mouth daily as needed.    Yes [provider]  Melatonin 3 MG TABS Take by mouth.   Yes [provider]  NIASPAN 500 MG CR tablet  03/23/17  Yes [provider]  Omega-3 1000 MG CAPS Take by mouth.   Yes [provider]  pseudoephedrine (SUDAFED) 30 MG tablet Take by mouth.   Yes [provider]  sildenafil (VIAGRA) 100 MG tablet TAKE ONE TABLET BY MOUTH ONE HOUR PRIOR TO INTERCOURSE AS NEEDED 11/06/19  Yes Stoioff, Ronda Fairly, MD  TOPROL XL  25 MG 24 hr tablet  01/27/17  Yes [provider]  vitamin C (ASCORBIC ACID) 500 MG tablet Take 500 mg by mouth.   Yes [provider]    Allergies as of 11/28/2019 - Review Complete 11/28/2019  Allergen Reaction Noted  . Pollen extract  03/19/2014  . Sulfa antibiotics Other (See Comments) 01/15/2016  . Sulfamethoxazole-trimethoprim  10/22/2010  . Benadryl [diphenhydramine hcl (sleep)] Anxiety 01/15/2016    Family History  Problem Relation Age of Onset  . Prostate cancer Paternal Uncle   . Bladder Cancer Neg Hx   . Kidney cancer Neg Hx     Social History   Socioeconomic History  . Marital status: Married    Spouse name: Not on file  . Number of children: Not on file   . Years of education: Not on file  . Highest education level: Not on file  Occupational History  . Not on file  Tobacco Use  . Smoking status: Never Smoker  . Smokeless tobacco: Never Used  Vaping Use  . Vaping Use: Never used  Substance and Sexual Activity  . Alcohol use: Yes    Alcohol/week: 1.0 - 2.0 standard drink    Types: 1 - 2 Glasses of wine per week    Comment: ocassional glass of wine  . Drug use: No  . Sexual activity: Yes    Birth control/protection: None  Other Topics Concern  . Not on file  Social History Narrative  . Not on file   Social Determinants of Health   Financial Resource Strain:   . Difficulty of Paying Living Expenses: Not on file  Food Insecurity:   . Worried About Charity fundraiser in the Last Year: Not on file  . Ran Out of Food in the Last Year: Not on file  Transportation Needs:   . Lack of Transportation (Medical): Not on file  . Lack of Transportation (Non-Medical): Not on file  Physical Activity:   . Days of Exercise per Week: Not on file  . Minutes of Exercise per Session: Not on file  Stress:   . Feeling of Stress : Not on file  Social Connections:   . Frequency of Communication with Friends and Family: Not on file  . Frequency of Social Gatherings with Friends and Family: Not on file  . Attends Religious Services: Not on file  . Active Member of Clubs or Organizations: Not on file  . Attends Archivist Meetings: Not on file  . Marital Status: Not on file  Intimate Partner Violence:   . Fear of Current or Ex-Partner: Not on file  . Emotionally Abused: Not on file  . Physically Abused: Not on file  . Sexually Abused: Not on file    Review of Systems: See HPI, otherwise negative ROS  Physical Exam: BP 130/77   Pulse 91   Temp (!) 97.3 F (36.3 C) (Temporal)   Ht 5\' 4"  (1.626 m)   Wt 69.4 kg   SpO2 99%   BMI 26.26 kg/m  General:   Alert,  pleasant and cooperative in NAD Head:  Normocephalic and  atraumatic. Neck:  Supple; no masses or thyromegaly. Lungs:  Clear throughout to auscultation.    Heart:  Regular rate and rhythm. Abdomen:  Soft, nontender and nondistended. Normal bowel sounds, without guarding, and without rebound.   Neurologic:  Alert and  oriented x4;  grossly normal neurologically.  Impression/Plan: Bruce Gomez is here for an colonoscopy to be performed for a  history of adenomatous polyps on 09/2014  Risks, benefits, limitations, and alternatives regarding  colonoscopy have been reviewed with the patient.  Questions have been answered.  All parties agreeable.   Lucilla Lame, MD  12/14/2019, 9:07 AM

## 2019-12-14 NOTE — Op Note (Signed)
[facility] Patient Name: Bruce Gomez Procedure Date: 12/14/2019 9:05 AM MRN: [id number] Account #: [id number] Date of Birth: Oct 18, 1945 Admit Type: Outpatient Age: 74 Room: [facility] OR ROOM 01 Gender: Male Note Status: Finalized Procedure:             Colonoscopy Indications:           High risk colon cancer surveillance: Personal history                         of colonic polyps Providers:             Lucilla Lame MD, MD Referring MD:          Cletis Athens, MD (Referring MD) Medicines:             Propofol per Anesthesia Complications:         No immediate complications. Procedure:             Pre-Anesthesia Assessment:                        - Prior to the procedure, a History and Physical was                         performed, and patient medications and allergies were                         reviewed. The patient's tolerance of previous                         anesthesia was also reviewed. The risks and benefits                         of the procedure and the sedation options and risks                         were discussed with the patient. All questions were                         answered, and informed consent was obtained. Prior                         Anticoagulants: The patient has taken no previous                         anticoagulant or antiplatelet agents. ASA Grade                         Assessment: II - A patient with mild systemic disease.                         After reviewing the risks and benefits, the patient                         was deemed in satisfactory condition to undergo the                         procedure.  After obtaining informed consent, the colonoscope was                         passed under direct vision. Throughout the procedure,                         the patient's blood pressure, pulse, and oxygen                         saturations were monitored continuously. The was                          introduced through the anus and advanced to the the                         cecum, identified by appendiceal orifice and ileocecal                         valve. The colonoscopy was performed without                         difficulty. The patient tolerated the procedure well.                         The quality of the bowel preparation was excellent. Findings:      The perianal and digital rectal examinations were normal.      A 7 mm polyp was found in the ascending colon. The polyp was sessile.       The polyp was removed with a cold biopsy forceps. The polyp was removed       with a cold snare. Resection and retrieval were complete.      Multiple small-mouthed diverticula were found in the sigmoid colon and       descending colon.      Non-bleeding internal hemorrhoids were found during retroflexion. The       hemorrhoids were Grade I (internal hemorrhoids that do not prolapse). Impression:            - One 7 mm polyp in the ascending colon, removed with                         a cold snare and removed with a cold biopsy forceps.                         Resected and retrieved.                        - Diverticulosis in the sigmoid colon and in the                         descending colon.                        - Non-bleeding internal hemorrhoids. Recommendation:        - Discharge patient to home.                        - Resume previous diet.                        -  Continue present medications.                        - Await pathology results. Procedure Code(s):     --- Professional ---                        [phone], Colonoscopy, flexible; with removal of                         tumor(s), polyp(s), or other lesion(s) by snare                         technique Diagnosis Code(s):     --- Professional ---                        Z86.010, Personal history of colonic polyps                        K63.5, Polyp of colon CPT copyright 2019 American Medical Association. All  rights reserved. The codes documented in this report are preliminary and upon coder review may  be revised to meet current compliance requirements. Lucilla Lame MD, MD 12/14/2019 9:33:28 AM This report has been signed electronically. Number of Addenda: 0 Note Initiated On: 12/14/2019 9:05 AM Scope Withdrawal Time: 0 hours 10 minutes 25 seconds  Total Procedure Duration: 0 hours 13 minutes 5 seconds  Estimated Blood Loss:  Estimated blood loss: none.      [facility]

## 2019-12-14 NOTE — Anesthesia Procedure Notes (Signed)
Procedure Name: MAC Date/Time: 12/14/2019 9:22 AM Performed by: Jeannene Patella, CRNA Pre-anesthesia Checklist: Patient identified, Emergency Drugs available, Suction available, Timeout performed and Patient being monitored Patient Re-evaluated:Patient Re-evaluated prior to induction Oxygen Delivery Method: Nasal cannula Placement Confirmation: positive ETCO2

## 2019-12-14 NOTE — Anesthesia Preprocedure Evaluation (Addendum)
Anesthesia Evaluation  Patient identified by MRN, date of birth, ID band Patient awake    Reviewed: Allergy & Precautions, NPO status   Airway Mallampati: II  TM Distance: >3 FB     Dental   Pulmonary  Possible history of OSA, resolved with weight loss   breath sounds clear to auscultation       Cardiovascular hypertension, + CAD, + Past MI and + CABG   Rhythm:Regular Rate:Normal  HLD   Neuro/Psych RLS    GI/Hepatic   Endo/Other    Renal/GU      Musculoskeletal  (+) Arthritis ,   Abdominal   Peds  Hematology   Anesthesia Other Findings   Reproductive/Obstetrics                            Anesthesia Physical Anesthesia Plan  ASA: III  Anesthesia Plan: General   Post-op Pain Management:    Induction: Intravenous  PONV Risk Score and Plan: TIVA, Treatment may vary due to age or medical condition and Propofol infusion  Airway Management Planned: Natural Airway and Nasal Cannula  Additional Equipment:   Intra-op Plan:   Post-operative Plan:   Informed Consent: I have reviewed the patients History and Physical, chart, labs and discussed the procedure including the risks, benefits and alternatives for the proposed anesthesia with the patient or authorized representative who has indicated his/her understanding and acceptance.       Plan Discussed with: CRNA  Anesthesia Plan Comments:         Anesthesia Quick Evaluation

## 2019-12-14 NOTE — Anesthesia Postprocedure Evaluation (Signed)
Anesthesia Post Note  Patient: Bruce Gomez  Procedure(s) Performed: COLONOSCOPY WITH PROPOFOL (N/A ) POLYPECTOMY (Rectum)     Patient location during evaluation: PACU Anesthesia Type: General Level of consciousness: awake Pain management: pain level controlled Vital Signs Assessment: post-procedure vital signs reviewed and stable Respiratory status: respiratory function stable Cardiovascular status: stable Postop Assessment: no signs of nausea or vomiting Anesthetic complications: no   No complications documented.  Veda Canning

## 2019-12-14 NOTE — Transfer of Care (Signed)
Immediate Anesthesia Transfer of Care Note  Patient: Bruce Gomez  Procedure(s) Performed: COLONOSCOPY WITH PROPOFOL (N/A ) POLYPECTOMY (Rectum)  Patient Location: PACU  Anesthesia Type: General  Level of Consciousness: awake, alert  and patient cooperative  Airway and Oxygen Therapy: Patient Spontanous Breathing and Patient connected to supplemental oxygen  Post-op Assessment: Post-op Vital signs reviewed, Patient's Cardiovascular Status Stable, Respiratory Function Stable, Patent Airway and No signs of Nausea or vomiting  Post-op Vital Signs: Reviewed and stable  Complications: No complications documented.

## 2019-12-18 LAB — SURGICAL PATHOLOGY

## 2019-12-19 ENCOUNTER — Encounter: Payer: Self-pay | Admitting: Gastroenterology

## 2020-01-09 ENCOUNTER — Ambulatory Visit: Payer: Medicare PPO | Admitting: Dermatology

## 2020-01-17 ENCOUNTER — Other Ambulatory Visit: Payer: Self-pay | Admitting: Internal Medicine

## 2020-01-29 ENCOUNTER — Encounter: Payer: Medicare PPO | Admitting: Internal Medicine

## 2020-01-31 ENCOUNTER — Ambulatory Visit (INDEPENDENT_AMBULATORY_CARE_PROVIDER_SITE_OTHER): Payer: Medicare PPO

## 2020-01-31 ENCOUNTER — Other Ambulatory Visit: Payer: Self-pay

## 2020-01-31 DIAGNOSIS — R972 Elevated prostate specific antigen [PSA]: Secondary | ICD-10-CM | POA: Diagnosis not present

## 2020-01-31 DIAGNOSIS — I10 Essential (primary) hypertension: Secondary | ICD-10-CM | POA: Diagnosis not present

## 2020-01-31 DIAGNOSIS — N411 Chronic prostatitis: Secondary | ICD-10-CM

## 2020-01-31 DIAGNOSIS — E785 Hyperlipidemia, unspecified: Secondary | ICD-10-CM

## 2020-02-01 LAB — CBC WITH DIFFERENTIAL/PLATELET
Absolute Monocytes: 772 cells/uL (ref 200–950)
Basophils Absolute: 31 cells/uL (ref 0–200)
Basophils Relative: 0.4 %
Eosinophils Absolute: 289 cells/uL (ref 15–500)
Eosinophils Relative: 3.7 %
HCT: 41 % (ref 38.5–50.0)
Hemoglobin: 13.6 g/dL (ref 13.2–17.1)
Lymphs Abs: 1443 cells/uL (ref 850–3900)
MCH: 32.3 pg (ref 27.0–33.0)
MCHC: 33.2 g/dL (ref 32.0–36.0)
MCV: 97.4 fL (ref 80.0–100.0)
MPV: 12.3 fL (ref 7.5–12.5)
Monocytes Relative: 9.9 %
Neutro Abs: 5265 cells/uL (ref 1500–7800)
Neutrophils Relative %: 67.5 %
Platelets: 199 10*3/uL (ref 140–400)
RBC: 4.21 10*6/uL (ref 4.20–5.80)
RDW: 12.1 % (ref 11.0–15.0)
Total Lymphocyte: 18.5 %
WBC: 7.8 10*3/uL (ref 3.8–10.8)

## 2020-02-01 LAB — COMPLETE METABOLIC PANEL WITH GFR
AG Ratio: 1.6 (calc) (ref 1.0–2.5)
ALT: 13 U/L (ref 9–46)
AST: 20 U/L (ref 10–35)
Albumin: 4.2 g/dL (ref 3.6–5.1)
Alkaline phosphatase (APISO): 77 U/L (ref 35–144)
BUN/Creatinine Ratio: 17 (calc) (ref 6–22)
BUN: 20 mg/dL (ref 7–25)
CO2: 26 mmol/L (ref 20–32)
Calcium: 9.5 mg/dL (ref 8.6–10.3)
Chloride: 100 mmol/L (ref 98–110)
Creat: 1.21 mg/dL — ABNORMAL HIGH (ref 0.70–1.18)
GFR, Est African American: 67 mL/min/{1.73_m2} (ref >=60)
GFR, Est Non African American: 58 mL/min/{1.73_m2} — ABNORMAL LOW (ref >=60)
Globulin: 2.6 g/dL (calc) (ref 1.9–3.7)
Glucose, Bld: 78 mg/dL (ref 65–99)
Potassium: 4.9 mmol/L (ref 3.5–5.3)
Sodium: 134 mmol/L — ABNORMAL LOW (ref 135–146)
Total Bilirubin: 0.4 mg/dL (ref 0.2–1.2)
Total Protein: 6.8 g/dL (ref 6.1–8.1)

## 2020-02-01 LAB — LIPID PANEL
Cholesterol: 142 mg/dL (ref <200)
HDL: 68 mg/dL (ref >=40)
LDL Cholesterol (Calc): 61 mg/dL (calc)
Non-HDL Cholesterol (Calc): 74 mg/dL (calc) (ref <130)
Total CHOL/HDL Ratio: 2.1 (calc) (ref <5.0)
Triglycerides: 54 mg/dL (ref <150)

## 2020-02-01 LAB — TSH: TSH: 1.2 mIU/L (ref 0.40–4.50)

## 2020-02-01 LAB — PSA: PSA: 4.02 ng/mL — ABNORMAL HIGH (ref <=4.0)

## 2020-02-05 ENCOUNTER — Ambulatory Visit (INDEPENDENT_AMBULATORY_CARE_PROVIDER_SITE_OTHER): Payer: Medicare PPO | Admitting: Internal Medicine

## 2020-02-05 ENCOUNTER — Other Ambulatory Visit: Payer: Self-pay

## 2020-02-05 ENCOUNTER — Encounter: Payer: Self-pay | Admitting: Internal Medicine

## 2020-02-05 VITALS — BP 118/68 | HR 82 | Ht 64.0 in | Wt 160.5 lb

## 2020-02-05 DIAGNOSIS — N411 Chronic prostatitis: Secondary | ICD-10-CM | POA: Diagnosis not present

## 2020-02-05 DIAGNOSIS — N138 Other obstructive and reflux uropathy: Secondary | ICD-10-CM

## 2020-02-05 DIAGNOSIS — N401 Enlarged prostate with lower urinary tract symptoms: Secondary | ICD-10-CM

## 2020-02-05 DIAGNOSIS — F419 Anxiety disorder, unspecified: Secondary | ICD-10-CM | POA: Diagnosis not present

## 2020-02-05 DIAGNOSIS — I10 Essential (primary) hypertension: Secondary | ICD-10-CM

## 2020-02-05 DIAGNOSIS — Z0000 Encounter for general adult medical examination without abnormal findings: Secondary | ICD-10-CM | POA: Diagnosis not present

## 2020-02-05 DIAGNOSIS — I2510 Atherosclerotic heart disease of native coronary artery without angina pectoris: Secondary | ICD-10-CM | POA: Diagnosis not present

## 2020-02-05 DIAGNOSIS — E782 Mixed hyperlipidemia: Secondary | ICD-10-CM

## 2020-02-05 DIAGNOSIS — I2583 Coronary atherosclerosis due to lipid rich plaque: Secondary | ICD-10-CM

## 2020-02-05 MED ORDER — CLONAZEPAM 0.5 MG PO TABS: 0.2500 mg | ORAL_TABLET | Freq: Every day | ORAL | 1 refills | Status: DC

## 2020-02-05 NOTE — Assessment & Plan Note (Signed)
Patient take fish oil and statin to control the high cholesterol.  He is also following low-cholesterol diet regular basis

## 2020-02-05 NOTE — Assessment & Plan Note (Signed)
Patient physical examination was normal neck is supple there is no goiter chest is clear heart is regular abdomen is soft nontender there is no pedal edema peripheral pulses are 2+.  Neurological examination is nonfocal.

## 2020-02-05 NOTE — Assessment & Plan Note (Signed)
He had to get up to 3 times at night to pass water and he sees a urologist in a regular basis.  Denies any history of fever chills or blood in the urine.

## 2020-02-05 NOTE — Progress Notes (Signed)
Established Patient Office Visit  Subjective:  Patient ID: Bruce Gomez, male    DOB: Jul 29, 1945  Age: 74 y.o. MRN: [id number]  CC:  Chief Complaint  Patient presents with  . Annual Exam    HPI  Bruce Gomez presents forphysical  Past Medical History:  Diagnosis Date  . Aortic sclerosis 04/21/2017   Overview:  Moderate Aortic Sclerosis, mild LVH,  mild Mitral valve thickening, Slight LAE on ECHO - started  on ACE  for  LVH  . Arthritis    hips and lower back  . BPH with obstruction/lower urinary tract symptoms 03/19/2014  . BPH with obstruction/lower urinary tract symptoms 03/19/2014  . CAD (coronary artery disease) 04/21/2017   Overview:   silent MI - Inferior; s/p CABG x 3; Stress Echo  6/05; 8/08;  2/12  . Chronic prostatitis 11/03/2000  . Coronary artery disease   . Elevated PSA 03/19/2014  . Erectile dysfunction 03/19/2014  . History of basal cell carcinoma 07/02/2015   right nose  . History of dysplastic nevus 06/22/2017   left ant deltoid  . Hyperlipidemia 04/21/2017  . Hypertension   . Impaired glucose tolerance 04/21/2017  . Myocardial infarction ([place]) 2002  . OSA (obstructive sleep apnea) 04/21/2017   Overview:  rx with wt loss, side sleeping-no CPAP, not true sleep apnea  . RLS (restless legs syndrome) 04/21/2017    Past Surgical History:  Procedure Laterality Date  . ADENOIDECTOMY    . CARDIAC SURGERY     cabg 2002  . COLONOSCOPY WITH PROPOFOL N/A 12/14/2019   Procedure: COLONOSCOPY WITH PROPOFOL;  Surgeon: Lucilla Lame, MD;  Location: [place];  Service: Endoscopy;  Laterality: N/A;  priority 4  . CORONARY ARTERY BYPASS GRAFT  2002   x3  . INGUINAL HERNIA REPAIR    . POLYPECTOMY  12/14/2019   Procedure: POLYPECTOMY;  Surgeon: Lucilla Lame, MD;  Location: [place];  Service: Endoscopy;;  . TRANSURETHRAL RESECTION OF PROSTATE     2014    Family History  Problem Relation Age of Onset  . Prostate cancer Paternal Uncle   . Bladder  Cancer Neg Hx   . Kidney cancer Neg Hx     Social History   Socioeconomic History  . Marital status: Married    Spouse name: Not on file  . Number of children: Not on file  . Years of education: Not on file  . Highest education level: Not on file  Occupational History  . Not on file  Tobacco Use  . Smoking status: Never Smoker  . Smokeless tobacco: Never Used  Vaping Use  . Vaping Use: Never used  Substance and Sexual Activity  . Alcohol use: Yes    Alcohol/week: 1.0 - 2.0 standard drink    Types: 1 - 2 Glasses of wine per week    Comment: ocassional glass of wine  . Drug use: No  . Sexual activity: Yes    Birth control/protection: None  Other Topics Concern  . Not on file  Social History Narrative  . Not on file   Social Determinants of Health   Financial Resource Strain:   . Difficulty of Paying Living Expenses: Not on file  Food Insecurity:   . Worried About Charity fundraiser in the Last Year: Not on file  . Ran Out of Food in the Last Year: Not on file  Transportation Needs:   . Lack of Transportation (Medical): Not on file  . Lack of Transportation (Non-Medical): Not  on file  Physical Activity:   . Days of Exercise per Week: Not on file  . Minutes of Exercise per Session: Not on file  Stress:   . Feeling of Stress : Not on file  Social Connections:   . Frequency of Communication with Friends and Family: Not on file  . Frequency of Social Gatherings with Friends and Family: Not on file  . Attends Religious Services: Not on file  . Active Member of Clubs or Organizations: Not on file  . Attends Archivist Meetings: Not on file  . Marital Status: Not on file  Intimate Partner Violence:   . Fear of Current or Ex-Partner: Not on file  . Emotionally Abused: Not on file  . Physically Abused: Not on file  . Sexually Abused: Not on file     Current Outpatient Medications:  .  aspirin EC 81 MG tablet, Take 81 mg by mouth., Disp: , Rfl:  .   atorvastatin (LIPITOR) 10 MG tablet, TAKE 1 TABLET EVERY DAY, Disp: 90 tablet, Rfl: 3 .  B Complex Vitamins (B COMPLEX 1 PO), Take by mouth. Takes 1 a week, Disp: , Rfl:  .  calcium carbonate (OSCAL) 1500 (600 Ca) MG TABS tablet, Take by mouth., Disp: , Rfl:  .  Cholecalciferol (VITAMIN D3) 2000 units capsule, Take by mouth., Disp: , Rfl:  .  clonazePAM (KLONOPIN) 0.5 MG tablet, Take 0.5 tablets (0.25 mg total) by mouth at bedtime., Disp: 30 tablet, Rfl: 1 .  Coenzyme Q10 100 MG capsule, Take 200 mg by mouth., Disp: , Rfl:  .  esomeprazole (NEXIUM) 40 MG capsule, , Disp: , Rfl:  .  finasteride (PROSCAR) 5 MG tablet, Take 1 tablet (5 mg total) by mouth daily., Disp: 90 tablet, Rfl: 3 .  folic acid (FOLVITE) 540 MCG tablet, Take by mouth., Disp: , Rfl:  .  lisinopril (ZESTRIL) 10 MG tablet, Take 1 tablet (10 mg total) by mouth daily., Disp: 90 tablet, Rfl: 3 .  loratadine (CLARITIN) 10 MG tablet, Take 10 mg by mouth daily as needed. , Disp: , Rfl:  .  Melatonin 3 MG TABS, Take by mouth., Disp: , Rfl:  .  NIASPAN 500 MG CR tablet, , Disp: , Rfl:  .  Omega-3 1000 MG CAPS, Take by mouth., Disp: , Rfl:  .  pseudoephedrine (SUDAFED) 30 MG tablet, Take by mouth., Disp: , Rfl:  .  sildenafil (VIAGRA) 100 MG tablet, TAKE ONE TABLET BY MOUTH ONE HOUR PRIOR TO INTERCOURSE AS NEEDED, Disp: 90 tablet, Rfl: 0 .  TOPROL XL 25 MG 24 hr tablet, , Disp: , Rfl:  .  vitamin C (ASCORBIC ACID) 500 MG tablet, Take 500 mg by mouth., Disp: , Rfl:    Allergies  Allergen Reactions  . Pollen Extract     seasonal  . Sulfa Antibiotics Other (See Comments)    Dizziness   . Sulfamethoxazole-Trimethoprim     dizzy dizzy   . Benadryl [Diphenhydramine Hcl (Sleep)] Anxiety    ROS Review of Systems  Constitutional: Negative.   HENT: Negative.   Eyes: Negative.   Respiratory: Negative.   Cardiovascular: Negative.   Gastrointestinal: Negative.   Endocrine: Negative.   Genitourinary: Positive for enuresis. Negative  for hematuria.  Musculoskeletal: Positive for arthralgias and back pain.  Skin: Negative.   Allergic/Immunologic: Negative.   Neurological: Negative.   Hematological: Negative.   Psychiatric/Behavioral: Negative.  Negative for behavioral problems and dysphoric mood.  All other systems reviewed and are negative.  Objective:    Physical Exam Vitals reviewed.  Constitutional:      Appearance: Normal appearance.  HENT:     Mouth/Throat:     Mouth: Mucous membranes are moist.  Eyes:     Pupils: Pupils are equal, round, and reactive to light.  Neck:     Vascular: No carotid bruit.  Cardiovascular:     Rate and Rhythm: Normal rate and regular rhythm.     Pulses: Normal pulses.     Heart sounds: Normal heart sounds.  Pulmonary:     Effort: Pulmonary effort is normal.     Breath sounds: Normal breath sounds.  Abdominal:     General: Bowel sounds are normal.     Palpations: Abdomen is soft. There is no hepatomegaly, splenomegaly or mass.     Tenderness: There is no abdominal tenderness.     Hernia: No hernia is present.  Musculoskeletal:     Cervical back: Neck supple.     Right lower leg: No edema.     Left lower leg: No edema.  Skin:    Findings: No rash.  Neurological:     Mental Status: He is alert and oriented to person, place, and time.     Motor: No weakness.  Psychiatric:        Mood and Affect: Mood normal.        Behavior: Behavior normal.     BP 118/68   Pulse 82   Ht 5\' 4"  (1.626 m)   Wt 160 lb 8 oz (72.8 kg)   BMI 27.55 kg/m  Wt Readings from Last 3 Encounters:  02/05/20 160 lb 8 oz (72.8 kg)  12/14/19 153 lb (69.4 kg)  08/30/19 153 lb 3.2 oz (69.5 kg)     Health Maintenance Due  Topic Date Due  . PNA vac Low Risk Adult (2 of 2 - PCV13) 04/21/2011    There are no preventive care reminders to display for this patient.  Lab Results  Component Value Date   TSH 1.20 01/31/2020   Lab Results  Component Value Date   WBC 7.8 01/31/2020    HGB 13.6 01/31/2020   HCT 41.0 01/31/2020   MCV 97.4 01/31/2020   PLT 199 01/31/2020   Lab Results  Component Value Date   NA 134 (L) 01/31/2020   K 4.9 01/31/2020   CO2 26 01/31/2020   GLUCOSE 78 01/31/2020   BUN 20 01/31/2020   CREATININE 1.21 (H) 01/31/2020   BILITOT 0.4 01/31/2020   ALKPHOS 85 10/13/2017   AST 20 01/31/2020   ALT 13 01/31/2020   PROT 6.8 01/31/2020   ALBUMIN 4.3 10/13/2017   CALCIUM 9.5 01/31/2020   ANIONGAP 9 01/15/2016   Lab Results  Component Value Date   CHOL 142 01/31/2020   Lab Results  Component Value Date   HDL 68 01/31/2020   Lab Results  Component Value Date   LDLCALC 61 01/31/2020   Lab Results  Component Value Date   TRIG 54 01/31/2020   Lab Results  Component Value Date   CHOLHDL 2.1 01/31/2020   No results found for: HGBA1C    Assessment & Plan:   Problem List Items Addressed This Visit      Cardiovascular and Mediastinum   CAD (coronary artery disease)    Patient was having anginal we will however do a stress test because of the history of bypass surgery      Essential hypertension - Primary    Patient blood pressure is  under control on present medication.  He has a history of bypass surgery about 20 years ago and he is not having any angina.  I will do a stress test on him in about 2 weeks.        Genitourinary   Chronic prostatitis    He had to get up to 3 times at night to pass water and he sees a urologist in a regular basis.  Denies any history of fever chills or blood in the urine.      RESOLVED: BPH with obstruction/lower urinary tract symptoms     Other   Hyperlipidemia    Patient take fish oil and statin to control the high cholesterol.  He is also following low-cholesterol diet regular basis      Annual physical exam    Patient physical examination was normal neck is supple there is no goiter chest is clear heart is regular abdomen is soft nontender there is no pedal edema peripheral pulses are 2+.   Neurological examination is nonfocal.       Other Visit Diagnoses    Anxiety       Relevant Medications   clonazePAM (KLONOPIN) 0.5 MG tablet      Meds ordered this encounter  Medications  . clonazePAM (KLONOPIN) 0.5 MG tablet    Sig: Take 0.5 tablets (0.25 mg total) by mouth at bedtime.    Dispense:  30 tablet    Refill:  1    Follow-up: No follow-ups on file.    Cletis Athens, MD

## 2020-02-05 NOTE — Assessment & Plan Note (Signed)
Patient blood pressure is under control on present medication.  He has a history of bypass surgery about 20 years ago and he is not having any angina.  I will do a stress test on him in about 2 weeks.

## 2020-02-05 NOTE — Assessment & Plan Note (Signed)
Patient was having anginal we will however do a stress test because of the history of bypass surgery

## 2020-02-12 ENCOUNTER — Other Ambulatory Visit (INDEPENDENT_AMBULATORY_CARE_PROVIDER_SITE_OTHER): Payer: Medicare PPO

## 2020-02-12 ENCOUNTER — Other Ambulatory Visit: Payer: Self-pay

## 2020-02-12 DIAGNOSIS — I2510 Atherosclerotic heart disease of native coronary artery without angina pectoris: Secondary | ICD-10-CM

## 2020-02-12 DIAGNOSIS — I2583 Coronary atherosclerosis due to lipid rich plaque: Secondary | ICD-10-CM | POA: Diagnosis not present

## 2020-02-12 DIAGNOSIS — I10 Essential (primary) hypertension: Secondary | ICD-10-CM

## 2020-02-12 NOTE — Assessment & Plan Note (Signed)
A stress test was performed to evaluate for coronary artery disease

## 2020-02-12 NOTE — Assessment & Plan Note (Signed)
Blood pressure stable on the present medication.

## 2020-02-12 NOTE — Progress Notes (Unsigned)
Established Patient Office Visit  Subjective:  Patient ID: Bruce Gomez, male    DOB: June 09, 1945  Age: 74 y.o. MRN: [id number]  CC: No chief complaint on file.   HPI  Bruce Gomez presents for stress test  Past Medical History:  Diagnosis Date  . Aortic sclerosis 04/21/2017   Overview:  Moderate Aortic Sclerosis, mild LVH,  mild Mitral valve thickening, Slight LAE on ECHO - started  on ACE  for  LVH  . Arthritis    hips and lower back  . BPH with obstruction/lower urinary tract symptoms 03/19/2014  . BPH with obstruction/lower urinary tract symptoms 03/19/2014  . CAD (coronary artery disease) 04/21/2017   Overview:   silent MI - Inferior; s/p CABG x 3; Stress Echo  6/05; 8/08;  2/12  . Chronic prostatitis 11/03/2000  . Coronary artery disease   . Elevated PSA 03/19/2014  . Erectile dysfunction 03/19/2014  . History of basal cell carcinoma 07/02/2015   right nose  . History of dysplastic nevus 06/22/2017   left ant deltoid  . Hyperlipidemia 04/21/2017  . Hypertension   . Impaired glucose tolerance 04/21/2017  . Myocardial infarction ([place]) 2002  . OSA (obstructive sleep apnea) 04/21/2017   Overview:  rx with wt loss, side sleeping-no CPAP, not true sleep apnea  . RLS (restless legs syndrome) 04/21/2017    Past Surgical History:  Procedure Laterality Date  . ADENOIDECTOMY    . CARDIAC SURGERY     cabg 2002  . COLONOSCOPY WITH PROPOFOL N/A 12/14/2019   Procedure: COLONOSCOPY WITH PROPOFOL;  Surgeon: Lucilla Lame, MD;  Location: [facility];  Service: Endoscopy;  Laterality: N/A;  priority 4  . CORONARY ARTERY BYPASS GRAFT  2002   x3  . INGUINAL HERNIA REPAIR    . POLYPECTOMY  12/14/2019   Procedure: POLYPECTOMY;  Surgeon: Lucilla Lame, MD;  Location: [facility];  Service: Endoscopy;;  . TRANSURETHRAL RESECTION OF PROSTATE     2014    Family History  Problem Relation Age of Onset  . Prostate cancer Paternal Uncle   . Bladder Cancer Neg Hx   . Kidney  cancer Neg Hx     Social History   Socioeconomic History  . Marital status: Married    Spouse name: Not on file  . Number of children: Not on file  . Years of education: Not on file  . Highest education level: Not on file  Occupational History  . Not on file  Tobacco Use  . Smoking status: Never Smoker  . Smokeless tobacco: Never Used  Vaping Use  . Vaping Use: Never used  Substance and Sexual Activity  . Alcohol use: Yes    Alcohol/week: 1.0 - 2.0 standard drink    Types: 1 - 2 Glasses of wine per week    Comment: ocassional glass of wine  . Drug use: No  . Sexual activity: Yes    Birth control/protection: None  Other Topics Concern  . Not on file  Social History Narrative  . Not on file   Social Determinants of Health   Financial Resource Strain:   . Difficulty of Paying Living Expenses: Not on file  Food Insecurity:   . Worried About Charity fundraiser in the Last Year: Not on file  . Ran Out of Food in the Last Year: Not on file  Transportation Needs:   . Lack of Transportation (Medical): Not on file  . Lack of Transportation (Non-Medical): Not on file  Physical Activity:   .  Days of Exercise per Week: Not on file  . Minutes of Exercise per Session: Not on file  Stress:   . Feeling of Stress : Not on file  Social Connections:   . Frequency of Communication with Friends and Family: Not on file  . Frequency of Social Gatherings with Friends and Family: Not on file  . Attends Religious Services: Not on file  . Active Member of Clubs or Organizations: Not on file  . Attends Archivist Meetings: Not on file  . Marital Status: Not on file  Intimate Partner Violence:   . Fear of Current or Ex-Partner: Not on file  . Emotionally Abused: Not on file  . Physically Abused: Not on file  . Sexually Abused: Not on file     Current Outpatient Medications:  .  aspirin EC 81 MG tablet, Take 81 mg by mouth., Disp: , Rfl:  .  atorvastatin (LIPITOR) 10 MG  tablet, TAKE 1 TABLET EVERY DAY, Disp: 90 tablet, Rfl: 3 .  B Complex Vitamins (B COMPLEX 1 PO), Take by mouth. Takes 1 a week, Disp: , Rfl:  .  calcium carbonate (OSCAL) 1500 (600 Ca) MG TABS tablet, Take by mouth., Disp: , Rfl:  .  Cholecalciferol (VITAMIN D3) 2000 units capsule, Take by mouth., Disp: , Rfl:  .  clonazePAM (KLONOPIN) 0.5 MG tablet, Take 0.5 tablets (0.25 mg total) by mouth at bedtime., Disp: 30 tablet, Rfl: 1 .  Coenzyme Q10 100 MG capsule, Take 200 mg by mouth., Disp: , Rfl:  .  esomeprazole (NEXIUM) 40 MG capsule, , Disp: , Rfl:  .  finasteride (PROSCAR) 5 MG tablet, Take 1 tablet (5 mg total) by mouth daily., Disp: 90 tablet, Rfl: 3 .  folic acid (FOLVITE) 706 MCG tablet, Take by mouth., Disp: , Rfl:  .  lisinopril (ZESTRIL) 10 MG tablet, Take 1 tablet (10 mg total) by mouth daily., Disp: 90 tablet, Rfl: 3 .  loratadine (CLARITIN) 10 MG tablet, Take 10 mg by mouth daily as needed. , Disp: , Rfl:  .  Melatonin 3 MG TABS, Take by mouth., Disp: , Rfl:  .  NIASPAN 500 MG CR tablet, , Disp: , Rfl:  .  Omega-3 1000 MG CAPS, Take by mouth., Disp: , Rfl:  .  pseudoephedrine (SUDAFED) 30 MG tablet, Take by mouth., Disp: , Rfl:  .  sildenafil (VIAGRA) 100 MG tablet, TAKE ONE TABLET BY MOUTH ONE HOUR PRIOR TO INTERCOURSE AS NEEDED, Disp: 90 tablet, Rfl: 0 .  TOPROL XL 25 MG 24 hr tablet, , Disp: , Rfl:  .  vitamin C (ASCORBIC ACID) 500 MG tablet, Take 500 mg by mouth., Disp: , Rfl:    Allergies  Allergen Reactions  . Pollen Extract     seasonal  . Sulfa Antibiotics Other (See Comments)    Dizziness   . Sulfamethoxazole-Trimethoprim     dizzy dizzy   . Benadryl [Diphenhydramine Hcl (Sleep)] Anxiety    ROS Review of Systems neg    Objective:    Physical Exam   normal There were no vitals taken for this visit. Wt Readings from Last 3 Encounters:  02/05/20 160 lb 8 oz (72.8 kg)  12/14/19 153 lb (69.4 kg)  08/30/19 153 lb 3.2 oz (69.5 kg)     Health Maintenance  Due  Topic Date Due  . PNA vac Low Risk Adult (2 of 2 - PCV13) 04/21/2011    There are no preventive care reminders to display for this patient.  Lab Results  Component Value Date   TSH 1.20 01/31/2020   Lab Results  Component Value Date   WBC 7.8 01/31/2020   HGB 13.6 01/31/2020   HCT 41.0 01/31/2020   MCV 97.4 01/31/2020   PLT 199 01/31/2020   Lab Results  Component Value Date   NA 134 (L) 01/31/2020   K 4.9 01/31/2020   CO2 26 01/31/2020   GLUCOSE 78 01/31/2020   BUN 20 01/31/2020   CREATININE 1.21 (H) 01/31/2020   BILITOT 0.4 01/31/2020   ALKPHOS 85 10/13/2017   AST 20 01/31/2020   ALT 13 01/31/2020   PROT 6.8 01/31/2020   ALBUMIN 4.3 10/13/2017   CALCIUM 9.5 01/31/2020   ANIONGAP 9 01/15/2016   Lab Results  Component Value Date   CHOL 142 01/31/2020   Lab Results  Component Value Date   HDL 68 01/31/2020   Lab Results  Component Value Date   LDLCALC 61 01/31/2020   Lab Results  Component Value Date   TRIG 54 01/31/2020   Lab Results  Component Value Date   CHOLHDL 2.1 01/31/2020   No results found for: HGBA1C    Assessment & Plan:   Problem List Items Addressed This Visit      Cardiovascular and Mediastinum   CAD (coronary artery disease)    A stress test was performed to evaluate for coronary artery disease      Essential hypertension - Primary    Blood pressure stable on the present medication.       Report of the stress test.  Patient was exercised according to Bruce protocol.  At 1.56mph  at 10% grade  at  Speed of 2.5 miles an hour and next at 12.5% grade .  Exercise test stopped.  At the end of 9-minute with a maximum heart rate of 110, he did not have any chest pain, no ST changes of myocardial ischemia were noted at the peak heart rate.  Maximum blood pressure achieved was 140/80.  Patient did not have any claudication or arrhythmia during or after the stress test  No orders of the defined types were placed in this  encounter.   Follow-up: No follow-ups on file.    Cletis Athens, MD

## 2020-02-19 ENCOUNTER — Other Ambulatory Visit: Payer: Self-pay | Admitting: Internal Medicine

## 2020-02-21 ENCOUNTER — Other Ambulatory Visit: Payer: Self-pay | Admitting: Urology

## 2020-02-21 DIAGNOSIS — N138 Other obstructive and reflux uropathy: Secondary | ICD-10-CM

## 2020-02-21 DIAGNOSIS — N401 Enlarged prostate with lower urinary tract symptoms: Secondary | ICD-10-CM

## 2020-02-21 DIAGNOSIS — R972 Elevated prostate specific antigen [PSA]: Secondary | ICD-10-CM

## 2020-02-26 ENCOUNTER — Other Ambulatory Visit (INDEPENDENT_AMBULATORY_CARE_PROVIDER_SITE_OTHER): Payer: Medicare PPO

## 2020-02-26 ENCOUNTER — Other Ambulatory Visit: Payer: Self-pay

## 2020-02-26 DIAGNOSIS — I350 Nonrheumatic aortic (valve) stenosis: Secondary | ICD-10-CM

## 2020-03-04 NOTE — Progress Notes (Unsigned)
Established Patient Office Visit  Subjective:  Patient ID: Bruce Gomez, male    DOB: May 28, 1945  Age: 74 y.o. MRN: [id number]  CC: No chief complaint on file.   HPI  Bruce Gomez presents for echo, patient is known to have a heart murmur and has a history of bypass surgery he denies any chest pain or shortness of breath there is no history of syncope.  Past Medical History:  Diagnosis Date  . Aortic sclerosis 04/21/2017   Overview:  Moderate Aortic Sclerosis, mild LVH,  mild Mitral valve thickening, Slight LAE on ECHO - started  on ACE  for  LVH  . Arthritis    hips and lower back  . BPH with obstruction/lower urinary tract symptoms 03/19/2014  . BPH with obstruction/lower urinary tract symptoms 03/19/2014  . CAD (coronary artery disease) 04/21/2017   Overview:   silent MI - Inferior; s/p CABG x 3; Stress Echo  6/05; 8/08;  2/12  . Chronic prostatitis 11/03/2000  . Coronary artery disease   . Elevated PSA 03/19/2014  . Erectile dysfunction 03/19/2014  . History of basal cell carcinoma 07/02/2015   right nose  . History of dysplastic nevus 06/22/2017   left ant deltoid  . Hyperlipidemia 04/21/2017  . Hypertension   . Impaired glucose tolerance 04/21/2017  . Myocardial infarction ([place]) 2002  . OSA (obstructive sleep apnea) 04/21/2017   Overview:  rx with wt loss, side sleeping-no CPAP, not true sleep apnea  . RLS (restless legs syndrome) 04/21/2017    Past Surgical History:  Procedure Laterality Date  . ADENOIDECTOMY    . CARDIAC SURGERY     cabg 2002  . COLONOSCOPY WITH PROPOFOL N/A 12/14/2019   Procedure: COLONOSCOPY WITH PROPOFOL;  Surgeon: Lucilla Lame, MD;  Location: [facility];  Service: Endoscopy;  Laterality: N/A;  priority 4  . CORONARY ARTERY BYPASS GRAFT  2002   x3  . INGUINAL HERNIA REPAIR    . POLYPECTOMY  12/14/2019   Procedure: POLYPECTOMY;  Surgeon: Lucilla Lame, MD;  Location: [facility];  Service: Endoscopy;;  . TRANSURETHRAL RESECTION  OF PROSTATE     2014    Family History  Problem Relation Age of Onset  . Prostate cancer Paternal Uncle   . Bladder Cancer Neg Hx   . Kidney cancer Neg Hx     Social History   Socioeconomic History  . Marital status: Married    Spouse name: Not on file  . Number of children: Not on file  . Years of education: Not on file  . Highest education level: Not on file  Occupational History  . Not on file  Tobacco Use  . Smoking status: Never Smoker  . Smokeless tobacco: Never Used  Vaping Use  . Vaping Use: Never used  Substance and Sexual Activity  . Alcohol use: Yes    Alcohol/week: 1.0 - 2.0 standard drink    Types: 1 - 2 Glasses of wine per week    Comment: ocassional glass of wine  . Drug use: No  . Sexual activity: Yes    Birth control/protection: None  Other Topics Concern  . Not on file  Social History Narrative  . Not on file   Social Determinants of Health   Financial Resource Strain:   . Difficulty of Paying Living Expenses: Not on file  Food Insecurity:   . Worried About Charity fundraiser in the Last Year: Not on file  . Ran Out of Food in the Last  Year: Not on file  Transportation Needs:   . Lack of Transportation (Medical): Not on file  . Lack of Transportation (Non-Medical): Not on file  Physical Activity:   . Days of Exercise per Week: Not on file  . Minutes of Exercise per Session: Not on file  Stress:   . Feeling of Stress : Not on file  Social Connections:   . Frequency of Communication with Friends and Family: Not on file  . Frequency of Social Gatherings with Friends and Family: Not on file  . Attends Religious Services: Not on file  . Active Member of Clubs or Organizations: Not on file  . Attends Archivist Meetings: Not on file  . Marital Status: Not on file  Intimate Partner Violence:   . Fear of Current or Ex-Partner: Not on file  . Emotionally Abused: Not on file  . Physically Abused: Not on file  . Sexually Abused: Not  on file     Current Outpatient Medications:  .  aspirin EC 81 MG tablet, Take 81 mg by mouth., Disp: , Rfl:  .  atorvastatin (LIPITOR) 10 MG tablet, TAKE 1 TABLET EVERY DAY, Disp: 90 tablet, Rfl: 3 .  B Complex Vitamins (B COMPLEX 1 PO), Take by mouth. Takes 1 a week, Disp: , Rfl:  .  calcium carbonate (OSCAL) 1500 (600 Ca) MG TABS tablet, Take by mouth., Disp: , Rfl:  .  Cholecalciferol (VITAMIN D3) 2000 units capsule, Take by mouth., Disp: , Rfl:  .  clonazePAM (KLONOPIN) 0.5 MG tablet, Take 0.5 tablets (0.25 mg total) by mouth at bedtime., Disp: 30 tablet, Rfl: 1 .  Coenzyme Q10 100 MG capsule, Take 200 mg by mouth., Disp: , Rfl:  .  esomeprazole (NEXIUM) 40 MG capsule, , Disp: , Rfl:  .  finasteride (PROSCAR) 5 MG tablet, TAKE 1 TABLET (5 MG TOTAL) BY MOUTH DAILY., Disp: 90 tablet, Rfl: 3 .  folic acid (FOLVITE) 425 MCG tablet, Take by mouth., Disp: , Rfl:  .  lisinopril (ZESTRIL) 10 MG tablet, Take 1 tablet (10 mg total) by mouth daily., Disp: 90 tablet, Rfl: 3 .  loratadine (CLARITIN) 10 MG tablet, Take 10 mg by mouth daily as needed. , Disp: , Rfl:  .  Melatonin 3 MG TABS, Take by mouth., Disp: , Rfl:  .  NIASPAN 500 MG CR tablet, , Disp: , Rfl:  .  Omega-3 1000 MG CAPS, Take by mouth., Disp: , Rfl:  .  pseudoephedrine (SUDAFED) 30 MG tablet, Take by mouth., Disp: , Rfl:  .  sildenafil (VIAGRA) 100 MG tablet, TAKE ONE TABLET BY MOUTH ONE HOUR PRIOR TO INTERCOURSE AS NEEDED, Disp: 90 tablet, Rfl: 0 .  TOPROL XL 25 MG 24 hr tablet, , Disp: , Rfl:  .  vitamin C (ASCORBIC ACID) 500 MG tablet, Take 500 mg by mouth., Disp: , Rfl:    Allergies  Allergen Reactions  . Pollen Extract     seasonal  . Sulfa Antibiotics Other (See Comments)    Dizziness   . Sulfamethoxazole-Trimethoprim     dizzy dizzy   . Benadryl [Diphenhydramine Hcl (Sleep)] Anxiety    ROS Review of Systems    Objective:    Physical Exam  There were no vitals taken for this visit. Wt Readings from Last 3  Encounters:  02/05/20 160 lb 8 oz (72.8 kg)  12/14/19 153 lb (69.4 kg)  08/30/19 153 lb 3.2 oz (69.5 kg)     Health Maintenance Due  Topic  Date Due  . PNA vac Low Risk Adult (2 of 2 - PCV13) 04/21/2011    There are no preventive care reminders to display for this patient.  Lab Results  Component Value Date   TSH 1.20 01/31/2020   Lab Results  Component Value Date   WBC 7.8 01/31/2020   HGB 13.6 01/31/2020   HCT 41.0 01/31/2020   MCV 97.4 01/31/2020   PLT 199 01/31/2020   Lab Results  Component Value Date   NA 134 (L) 01/31/2020   K 4.9 01/31/2020   CO2 26 01/31/2020   GLUCOSE 78 01/31/2020   BUN 20 01/31/2020   CREATININE 1.21 (H) 01/31/2020   BILITOT 0.4 01/31/2020   ALKPHOS 85 10/13/2017   AST 20 01/31/2020   ALT 13 01/31/2020   PROT 6.8 01/31/2020   ALBUMIN 4.3 10/13/2017   CALCIUM 9.5 01/31/2020   ANIONGAP 9 01/15/2016   Lab Results  Component Value Date   CHOL 142 01/31/2020   Lab Results  Component Value Date   HDL 68 01/31/2020   Lab Results  Component Value Date   LDLCALC 61 01/31/2020   Lab Results  Component Value Date   TRIG 54 01/31/2020   Lab Results  Component Value Date   CHOLHDL 2.1 01/31/2020   No results found for: HGBA1C    Assessment & Plan:   No orders of the defined types were placed in this encounter.   Follow-up: No follow-ups on file.  [facility] [place],  [id number] Phone: [phone] Fax:  [phone]  Transthoracic Echocardiogram Note  Bruce Gomez [id number] May 11, 1945  Procedure: Transthoracic Echocardiogram Indications: aortic murmur Verbal Consent: Obtained  Procedure Details   Technical quality: good  Resting Measurements: LV  5.5cm  Left Ventrical: Ejection fraction is 48 to 50%.  Mitral Valve: normal  Aortic Valve: sclerotic mild aortic valve gradient up to 20 is noted  Tricuspid Valve: Normal  Pulmonic Valve: Not seen  Left Atrium/ Left  atrial appendage: Borderline enlarged  Atrial septum: Intact  Aorta: Sclerotic   Complications:   tolerate procedure well.  Cletis Athens, MD   Cletis Athens, MD

## 2020-03-19 ENCOUNTER — Other Ambulatory Visit: Payer: Self-pay

## 2020-03-19 ENCOUNTER — Encounter: Payer: Self-pay | Admitting: Dermatology

## 2020-03-19 ENCOUNTER — Ambulatory Visit (INDEPENDENT_AMBULATORY_CARE_PROVIDER_SITE_OTHER): Payer: Medicare PPO | Admitting: Dermatology

## 2020-03-19 DIAGNOSIS — Z1283 Encounter for screening for malignant neoplasm of skin: Secondary | ICD-10-CM | POA: Diagnosis not present

## 2020-03-19 DIAGNOSIS — L821 Other seborrheic keratosis: Secondary | ICD-10-CM | POA: Diagnosis not present

## 2020-03-19 DIAGNOSIS — B351 Tinea unguium: Secondary | ICD-10-CM | POA: Diagnosis not present

## 2020-03-19 DIAGNOSIS — L814 Other melanin hyperpigmentation: Secondary | ICD-10-CM

## 2020-03-19 DIAGNOSIS — L570 Actinic keratosis: Secondary | ICD-10-CM

## 2020-03-19 DIAGNOSIS — D1800 Hemangioma unspecified site: Secondary | ICD-10-CM

## 2020-03-19 DIAGNOSIS — Z85828 Personal history of other malignant neoplasm of skin: Secondary | ICD-10-CM | POA: Diagnosis not present

## 2020-03-19 DIAGNOSIS — Z86018 Personal history of other benign neoplasm: Secondary | ICD-10-CM

## 2020-03-19 DIAGNOSIS — D229 Melanocytic nevi, unspecified: Secondary | ICD-10-CM | POA: Diagnosis not present

## 2020-03-19 DIAGNOSIS — L578 Other skin changes due to chronic exposure to nonionizing radiation: Secondary | ICD-10-CM | POA: Diagnosis not present

## 2020-03-19 NOTE — Progress Notes (Signed)
Follow-Up Visit   Subjective  Bruce Gomez is a 74 y.o. male who presents for the following: tbse (Total body skin exam, hx of Dysplastic Nevi, AKs) and Tinea (Toenails, kerydin). The patient presents for Total-Body Skin Exam (TBSE) for skin cancer screening and mole check.  The following portions of the chart were reviewed this encounter and updated as appropriate:   Tobacco  Allergies  Meds  Problems  Med Hx  Surg Hx  Fam Hx     Review of Systems:  No other skin or systemic complaints except as noted in HPI or Assessment and Plan.  Objective  Well appearing patient in no apparent distress; mood and affect are within normal limits.  A full examination was performed including scalp, head, eyes, ears, nose, lips, neck, chest, axillae, abdomen, back, buttocks, bilateral upper extremities, bilateral lower extremities, hands, feet, fingers, toes, fingernails, and toenails. All findings within normal limits unless otherwise noted below.  Objective  R nose: Well healed scar with no evidence of recurrence.   Objective  R 3rd toenail, L great toenail: Toenail dystrophy  Objective  R lat forehead x 3 (3): Pink scaly macules    Assessment & Plan    Lentigines - Scattered tan macules - Discussed due to sun exposure - Benign, observe - Call for any changes  Seborrheic Keratoses - Stuck-on, waxy, tan-brown papules and plaques  - Discussed benign etiology and prognosis. - Observe - Call for any changes  Melanocytic Nevi - Tan-brown and/or pink-flesh-colored symmetric macules and papules - Benign appearing on exam today - Observation - Call clinic for new or changing moles - Recommend daily use of broad spectrum spf 30+ sunscreen to sun-exposed areas.   Hemangiomas - Red papules - Discussed benign nature - Observe - Call for any changes  Actinic Damage - Chronic, secondary to cumulative UV/sun exposure - diffuse scaly erythematous macules with underlying  dyspigmentation - Recommend daily broad spectrum sunscreen SPF 30+ to sun-exposed areas, reapply every 2 hours as needed.  - Call for new or changing lesions.  Skin cancer screening performed today.  History of Dysplastic Nevi - No evidence of recurrence today - Recommend regular full body skin exams - Recommend daily broad spectrum sunscreen SPF 30+ to sun-exposed areas, reapply every 2 hours as needed.  - Call if any new or changing lesions are noted between office visits  History of basal cell carcinoma (BCC) R nose  Clear. Observe for recurrence. Call clinic for new or changing lesions.  Recommend regular skin exams, daily broad-spectrum spf 30+ sunscreen use, and photoprotection.     Tinea unguium R 3rd toenail, L great toenail  Chronic, persistent  Restart Kerydin to affected toenails on R 3rd toenail, L great toenail Discussed oral treatment, pt declines  AK (actinic keratosis) (3) R lat forehead x 3  Destruction of lesion - R lat forehead x 3 Complexity: simple   Destruction method: cryotherapy   Informed consent: discussed and consent obtained   Timeout:  patient name, date of birth, surgical site, and procedure verified Lesion destroyed using liquid nitrogen: Yes   Region frozen until ice ball extended beyond lesion: Yes   Outcome: patient tolerated procedure well with no complications   Post-procedure details: wound care instructions given    Skin cancer screening  Return in about 1 year (around 03/19/2021) for TBSE, Hx of BCC, Hx of Dysplastic nevi, Hx of AKs.   I, Othelia Pulling, RMA, am acting as scribe for Sarina Ser, MD .  Documentation: I have reviewed the above documentation for accuracy and completeness, and I agree with the above.  Sarina Ser, MD

## 2020-03-24 ENCOUNTER — Encounter: Payer: Self-pay | Admitting: Dermatology

## 2020-04-09 ENCOUNTER — Other Ambulatory Visit: Payer: Self-pay

## 2020-04-11 IMAGING — US US RENAL
1 series · 14 of 25 positions shown · non-contrast
Comparison: Ultrasound exam dated 08/24/2017

CLINICAL DATA: Stage III A chronic kidney disease.

EXAM:
RENAL / URINARY TRACT ULTRASOUND COMPLETE

[Series 1: us renal · 0.25mm/px · 14 of 38 slices shown]
[im 1/38]
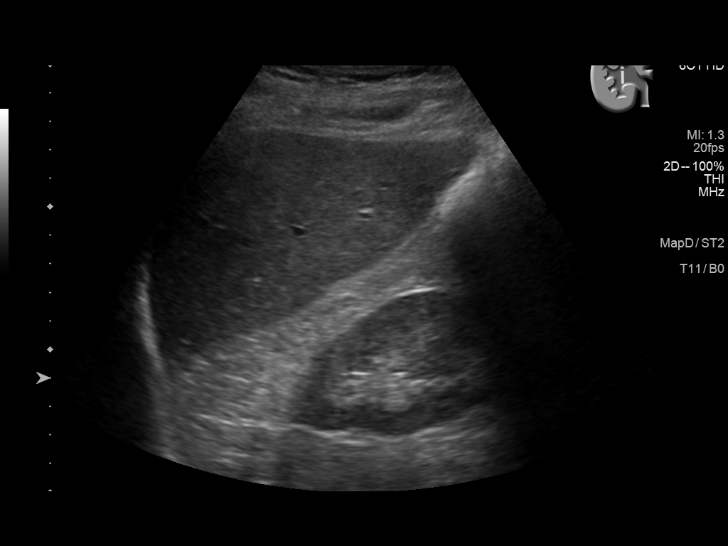
[im 4/38]
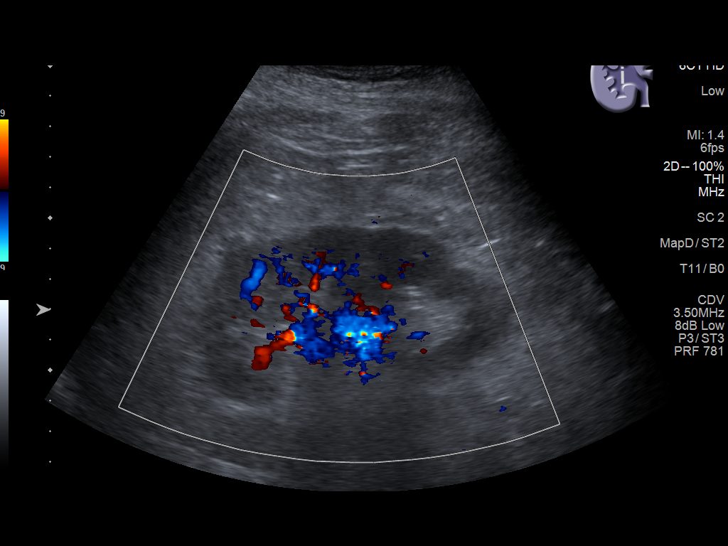
[im 7/38]
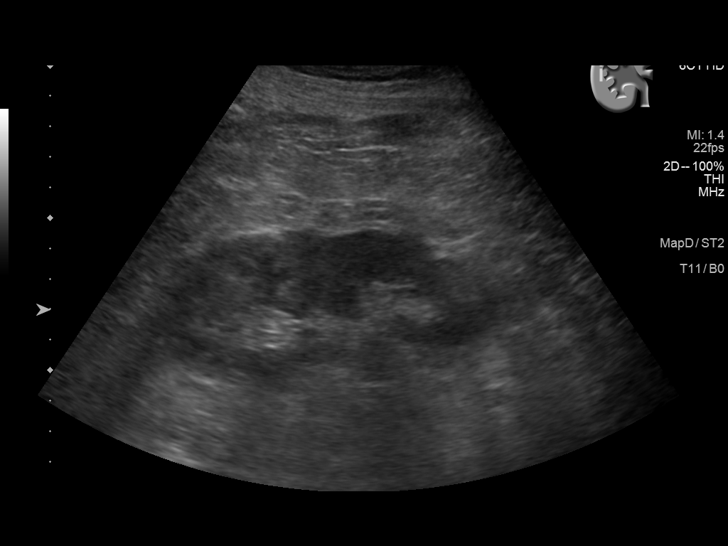
[im 10/38]
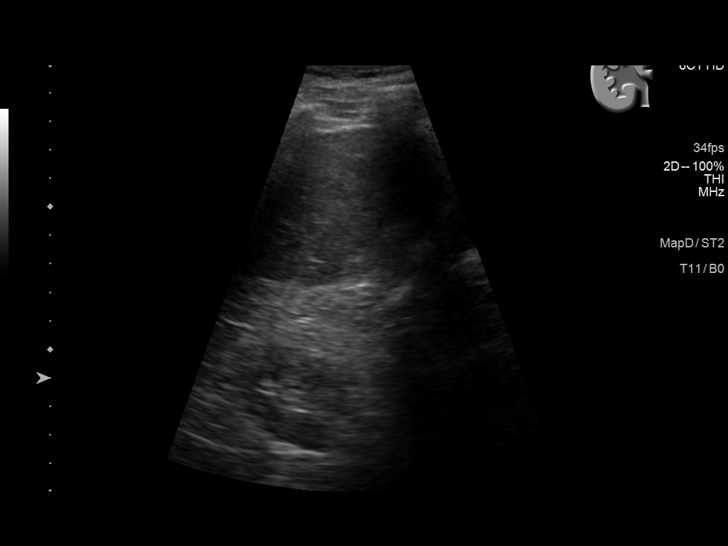
[im 13/38]
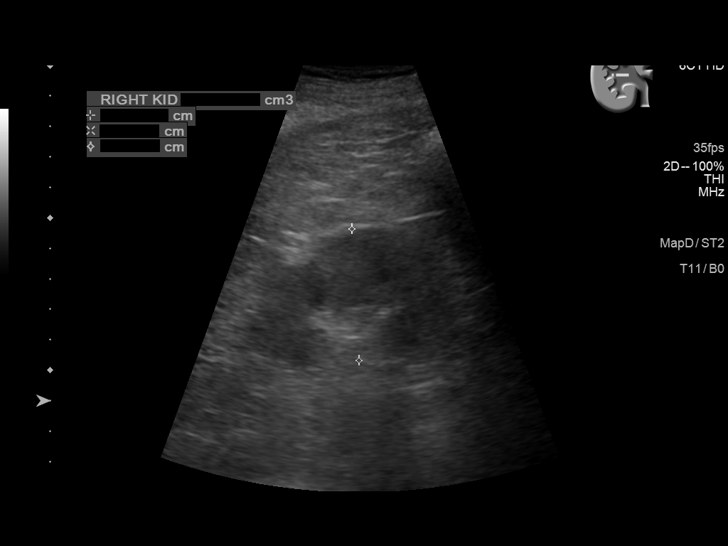
[im 14/38]
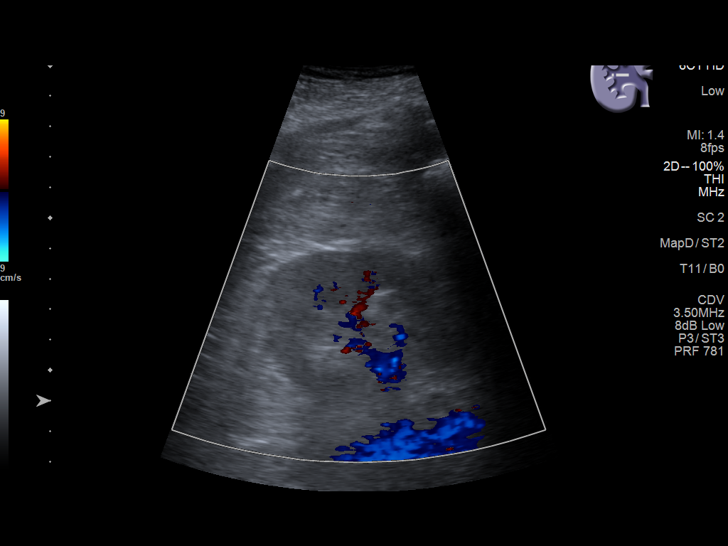
[im 17/38]
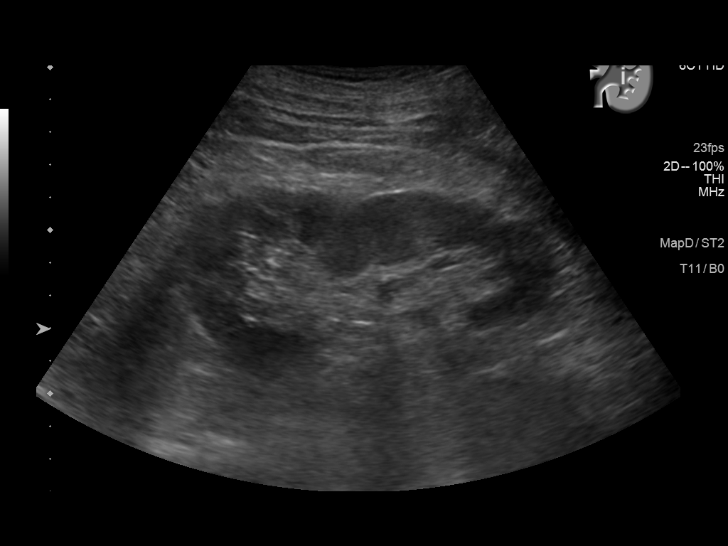
[im 21/38]
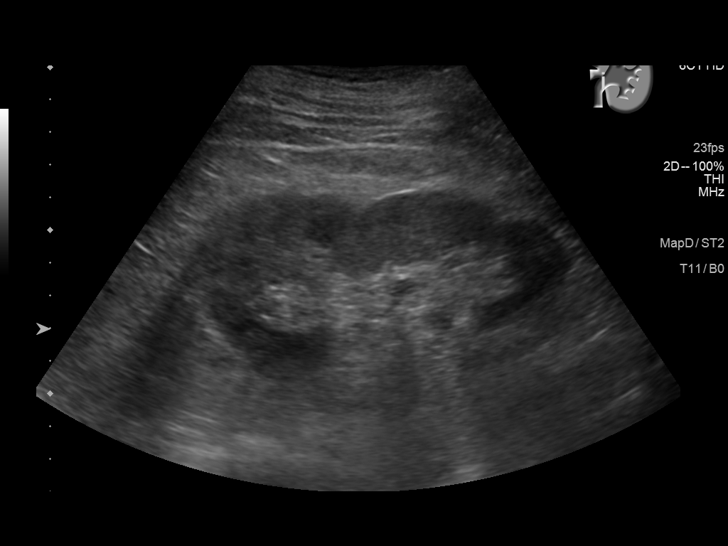
[im 24/38]
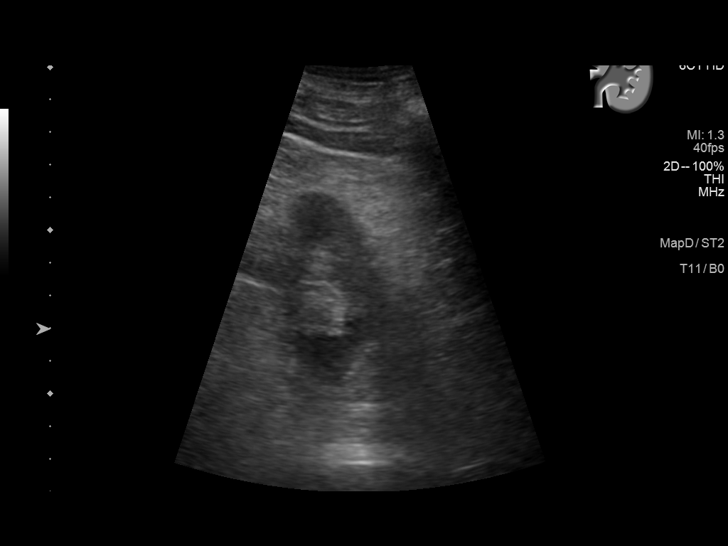
[im 25/38]
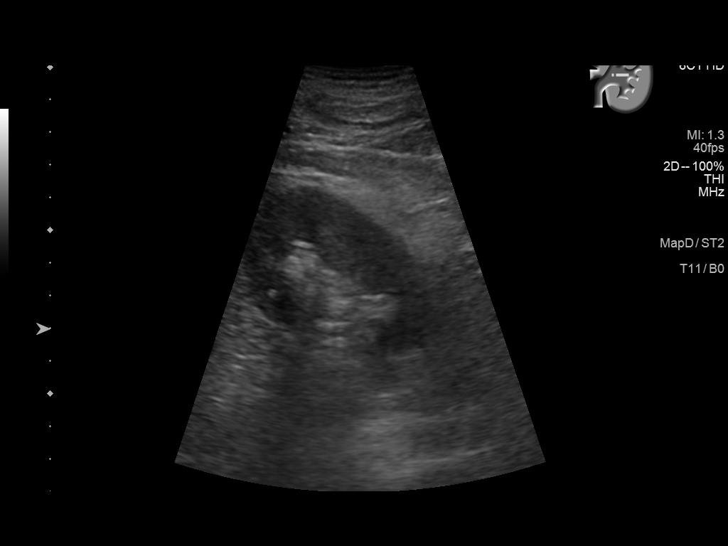
[im 28/38]
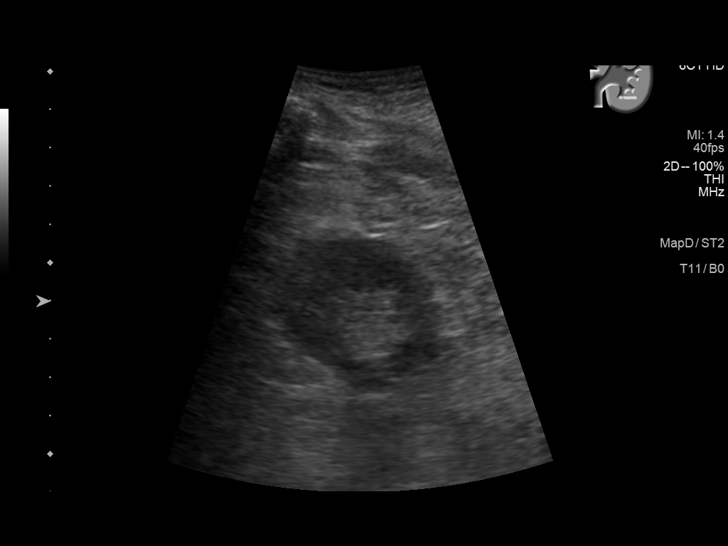
[im 31/38]
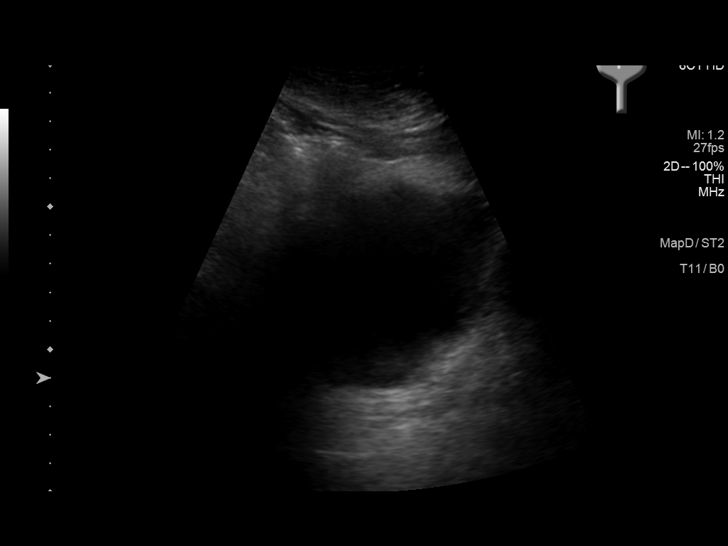
[im 34/38]
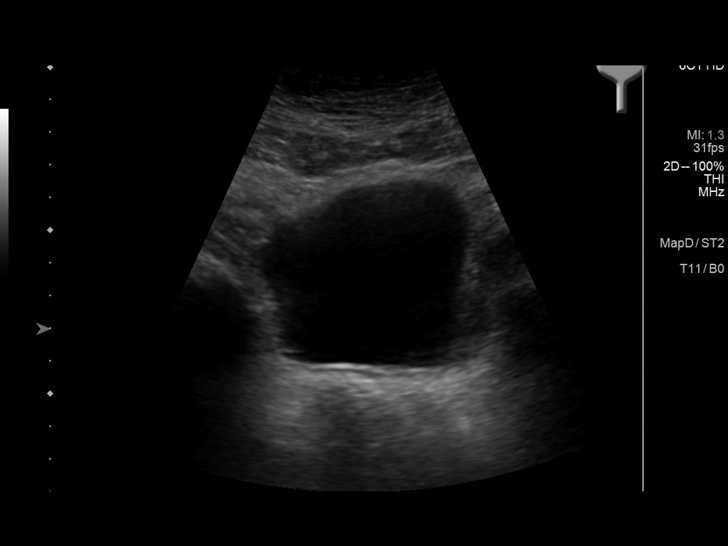
[im 38/38]
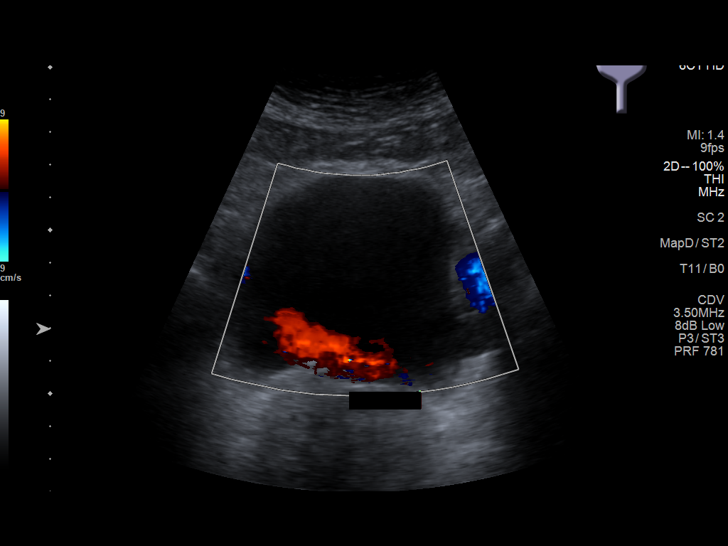

[14 of 25 positions shown; findings below may reference images not displayed]

FINDINGS: Right Kidney:

Renal measurements: 11.2 x 4.9 x 4.3 cm = volume: 125 mL .
Echogenicity within normal limits. No mass or hydronephrosis
visualized.

Left Kidney:

Renal measurements: 11.3 x 4.9 x 4.0 cm = volume: 117 mL.
Echogenicity within normal limits. No mass or hydronephrosis
visualized.

Bladder:

Appears normal for degree of bladder distention. Bilateral ureteral
jets identified.

Other:

None.
IMPRESSION: Normal exam.

## 2020-04-21 ENCOUNTER — Other Ambulatory Visit: Payer: Self-pay | Admitting: Internal Medicine

## 2020-05-08 ENCOUNTER — Encounter: Payer: Self-pay | Admitting: Urology

## 2020-05-08 ENCOUNTER — Ambulatory Visit (INDEPENDENT_AMBULATORY_CARE_PROVIDER_SITE_OTHER): Payer: Medicare PPO | Admitting: Urology

## 2020-05-08 ENCOUNTER — Other Ambulatory Visit: Payer: Self-pay

## 2020-05-08 VITALS — BP 130/81 | HR 90 | Ht 64.0 in | Wt 155.0 lb

## 2020-05-08 DIAGNOSIS — N138 Other obstructive and reflux uropathy: Secondary | ICD-10-CM

## 2020-05-08 DIAGNOSIS — N401 Enlarged prostate with lower urinary tract symptoms: Secondary | ICD-10-CM | POA: Diagnosis not present

## 2020-05-08 DIAGNOSIS — N5201 Erectile dysfunction due to arterial insufficiency: Secondary | ICD-10-CM | POA: Diagnosis not present

## 2020-05-08 DIAGNOSIS — R972 Elevated prostate specific antigen [PSA]: Secondary | ICD-10-CM

## 2020-05-08 LAB — BLADDER SCAN AMB NON-IMAGING: Scan Result: 0

## 2020-05-08 MED ORDER — FINASTERIDE 5 MG PO TABS: 5.0000 mg | ORAL_TABLET | Freq: Every day | ORAL | 3 refills | Status: DC

## 2020-05-08 MED ORDER — SILDENAFIL CITRATE 100 MG PO TABS: ORAL_TABLET | ORAL | 1 refills | Status: DC

## 2020-05-08 NOTE — Progress Notes (Signed)
05/08/2020 9:26 AM   Volney Presser 01/04/1946 [id number]  Referring provider: Cletis Athens, MD [address] [place],  [place] [address]  Chief Complaint  Patient presents with  . Benign Prostatic Hypertrophy    UrologicHistory:  1.Elevated PSA-previously followed in [place]. Four prior prostate biopsies since the early 2000 with benign pathology and a baseline PSA between 11-13.  2.BPH-urinary retention in 2014 and status post TURP by Dr. Robby Sermon with an incomplete resection. He had resection of a large intravesical median lobe with 26 g resected.  3.Erectile dysfunction.-On sildenafil   HPI: 75 y.o. male presents for annual follow-up.   No bothersome LUTS  Remains on finasteride  Denies dysuria, gross hematuria  Denies flank, abdominal or pelvic pain  Remains on sildenafil for ED with good efficacy  PSA 01/31/2020 4.02   PMH: Past Medical History:  Diagnosis Date  . Actinic keratosis   . Aortic sclerosis 04/21/2017   Overview:  Moderate Aortic Sclerosis, mild LVH,  mild Mitral valve thickening, Slight LAE on ECHO - started  on ACE  for  LVH  . Arthritis    hips and lower back  . BPH with obstruction/lower urinary tract symptoms 03/19/2014  . BPH with obstruction/lower urinary tract symptoms 03/19/2014  . CAD (coronary artery disease) 04/21/2017   Overview:   silent MI - Inferior; s/p CABG x 3; Stress Echo  6/05; 8/08;  2/12  . Chronic prostatitis 11/03/2000  . Coronary artery disease   . Elevated PSA 03/19/2014  . Erectile dysfunction 03/19/2014  . History of basal cell carcinoma 07/02/2015   right nose  . History of dysplastic nevus 06/22/2017   left ant deltoid  . Hyperlipidemia 04/21/2017  . Hypertension   . Impaired glucose tolerance 04/21/2017  . Myocardial infarction ([facility]) 2002  . OSA (obstructive sleep apnea) 04/21/2017   Overview:  rx with wt loss, side sleeping-no CPAP, not true sleep apnea  . RLS (restless legs syndrome)  04/21/2017    Surgical History: Past Surgical History:  Procedure Laterality Date  . ADENOIDECTOMY    . CARDIAC SURGERY     cabg 2002  . COLONOSCOPY WITH PROPOFOL N/A 12/14/2019   Procedure: COLONOSCOPY WITH PROPOFOL;  Surgeon: Lucilla Lame, MD;  Location: [facility];  Service: Endoscopy;  Laterality: N/A;  priority 4  . CORONARY ARTERY BYPASS GRAFT  2002   x3  . INGUINAL HERNIA REPAIR    . POLYPECTOMY  12/14/2019   Procedure: POLYPECTOMY;  Surgeon: Lucilla Lame, MD;  Location: [facility];  Service: Endoscopy;;  . TRANSURETHRAL RESECTION OF PROSTATE     2014    Home Medications:  Allergies as of 05/08/2020      Reactions   Pollen Extract    seasonal   Sulfa Antibiotics Other (See Comments)   Dizziness   Sulfamethoxazole-trimethoprim    dizzy dizzy   Benadryl [diphenhydramine Hcl (sleep)] Anxiety      Medication List       Accurate as of May 08, 2020  9:26 AM. If you have any questions, ask your nurse or doctor.        aspirin EC 81 MG tablet Take 81 mg by mouth.   atorvastatin 10 MG tablet Commonly known as: LIPITOR TAKE 1 TABLET EVERY DAY   B COMPLEX 1 PO Take by mouth. Takes 1 a week   calcium carbonate 1500 (600 Ca) MG Tabs tablet Commonly known as: OSCAL Take by mouth.   clonazePAM 0.5 MG tablet Commonly known as: KLONOPIN Take 0.5  tablets (0.25 mg total) by mouth at bedtime.   Coenzyme Q10 100 MG capsule Take 200 mg by mouth.   esomeprazole 40 MG capsule Commonly known as: NEXIUM   finasteride 5 MG tablet Commonly known as: PROSCAR TAKE 1 TABLET (5 MG TOTAL) BY MOUTH DAILY.   folic acid 161 MCG tablet Commonly known as: FOLVITE Take by mouth.   lisinopril 10 MG tablet Commonly known as: ZESTRIL Take 1 tablet (10 mg total) by mouth daily.   loratadine 10 MG tablet Commonly known as: CLARITIN Take 10 mg by mouth daily as needed.   melatonin 3 MG Tabs tablet Take by mouth.   niacin 500 MG CR tablet Commonly known  as: NIASPAN TAKE 4 TABLETS AT BEDTIME   Omega-3 1000 MG Caps Take by mouth.   pseudoephedrine 30 MG tablet Commonly known as: SUDAFED Take by mouth.   sildenafil 100 MG tablet Commonly known as: VIAGRA TAKE ONE TABLET BY MOUTH ONE HOUR PRIOR TO INTERCOURSE AS NEEDED   Toprol XL 25 MG 24 hr tablet Generic drug: metoprolol succinate   vitamin C 500 MG tablet Commonly known as: ASCORBIC ACID Take 500 mg by mouth.   Vitamin D3 50 MCG (2000 UT) capsule Take by mouth.       Allergies:  Allergies  Allergen Reactions  . Pollen Extract     seasonal  . Sulfa Antibiotics Other (See Comments)    Dizziness   . Sulfamethoxazole-Trimethoprim     dizzy dizzy   . Benadryl [Diphenhydramine Hcl (Sleep)] Anxiety    Family History: Family History  Problem Relation Age of Onset  . Prostate cancer Paternal Uncle   . Bladder Cancer Neg Hx   . Kidney cancer Neg Hx     Social History:  reports that he has never smoked. He has never used smokeless tobacco. He reports current alcohol use of about 1.0 - 2.0 standard drink of alcohol per week. He reports that he does not use drugs.   Physical Exam: BP 130/81   Pulse 90   Ht 5\' 4"  (1.626 m)   Wt 155 lb (70.3 kg)   BMI 26.61 kg/m   Constitutional:  Alert and oriented, No acute distress. HEENT: [place] AT, moist mucus membranes.  Trachea midline, no masses. Cardiovascular: No clubbing, cyanosis, or edema. GI: Abdomen is soft, nontender, nondistended, no abdominal masses GU: Prostate 60+ cc, smooth without nodules. Skin: No rashes, bruises or suspicious lesions. Neurologic: Grossly intact, no focal deficits, moving all 4 extremities. Psychiatric: Normal mood and affect.   Assessment & Plan:    1. BPH with obstruction/lower urinary tract symptoms  Stable LUTS on finasteride  Bladder scan PVR 0 mL  Finasteride refilled  2.  Erectile dysfunction  Stable on sildenafil  Refill sent  3.  Elevated PSA  Stable, below baseline  with benign DRE  Continue annual follow-up   [name], MD  [facility] [address], [place], [place] [address] [phone]

## 2020-06-25 ENCOUNTER — Other Ambulatory Visit: Payer: Self-pay | Admitting: Internal Medicine

## 2020-06-25 DIAGNOSIS — F419 Anxiety disorder, unspecified: Secondary | ICD-10-CM

## 2020-06-26 ENCOUNTER — Other Ambulatory Visit: Payer: Self-pay | Admitting: Internal Medicine

## 2020-06-26 DIAGNOSIS — F419 Anxiety disorder, unspecified: Secondary | ICD-10-CM

## 2020-07-01 ENCOUNTER — Other Ambulatory Visit: Payer: Self-pay

## 2020-07-01 ENCOUNTER — Ambulatory Visit (INDEPENDENT_AMBULATORY_CARE_PROVIDER_SITE_OTHER): Payer: Medicare PPO | Admitting: Internal Medicine

## 2020-07-01 ENCOUNTER — Encounter: Payer: Self-pay | Admitting: Internal Medicine

## 2020-07-01 VITALS — BP 132/73 | HR 91 | Ht 62.0 in | Wt 163.7 lb

## 2020-07-01 DIAGNOSIS — J069 Acute upper respiratory infection, unspecified: Secondary | ICD-10-CM

## 2020-07-01 DIAGNOSIS — E782 Mixed hyperlipidemia: Secondary | ICD-10-CM

## 2020-07-01 DIAGNOSIS — I10 Essential (primary) hypertension: Secondary | ICD-10-CM | POA: Diagnosis not present

## 2020-07-01 DIAGNOSIS — G4733 Obstructive sleep apnea (adult) (pediatric): Secondary | ICD-10-CM | POA: Diagnosis not present

## 2020-07-01 NOTE — Assessment & Plan Note (Signed)
Stable at the present time. 

## 2020-07-01 NOTE — Assessment & Plan Note (Signed)
Patient was started on Tussionex half a teaspoonful twice a day

## 2020-07-01 NOTE — Progress Notes (Signed)
Established Patient Office Visit  Subjective:  Patient ID: Bruce Gomez, male    DOB: 10-23-1945  Age: 75 y.o. MRN: [id number]  CC:  Chief Complaint  Patient presents with  . Follow-up    HPI  Bruce Gomez presents for allergic rhinitis, he denies any history of fever chills headache nausea vomiting chest pain shortness of breath on exertion.  He does not smoke does not drink.  Past Medical History:  Diagnosis Date  . Actinic keratosis   . Aortic sclerosis 04/21/2017   Overview:  Moderate Aortic Sclerosis, mild LVH,  mild Mitral valve thickening, Slight LAE on ECHO - started  on ACE  for  LVH  . Arthritis    hips and lower back  . BPH with obstruction/lower urinary tract symptoms 03/19/2014  . BPH with obstruction/lower urinary tract symptoms 03/19/2014  . CAD (coronary artery disease) 04/21/2017   Overview:   silent MI - Inferior; s/p CABG x 3; Stress Echo  6/05; 8/08;  2/12  . Chronic prostatitis 11/03/2000  . Coronary artery disease   . Elevated PSA 03/19/2014  . Erectile dysfunction 03/19/2014  . History of basal cell carcinoma 07/02/2015   right nose  . History of dysplastic nevus 06/22/2017   left ant deltoid  . Hyperlipidemia 04/21/2017  . Hypertension   . Impaired glucose tolerance 04/21/2017  . Myocardial infarction ([facility]) 2002  . OSA (obstructive sleep apnea) 04/21/2017   Overview:  rx with wt loss, side sleeping-no CPAP, not true sleep apnea  . RLS (restless legs syndrome) 04/21/2017    Past Surgical History:  Procedure Laterality Date  . ADENOIDECTOMY    . CARDIAC SURGERY     cabg 2002  . COLONOSCOPY WITH PROPOFOL N/A 12/14/2019   Procedure: COLONOSCOPY WITH PROPOFOL;  Surgeon: Lucilla Lame, MD;  Location: [facility];  Service: Endoscopy;  Laterality: N/A;  priority 4  . CORONARY ARTERY BYPASS GRAFT  2002   x3  . INGUINAL HERNIA REPAIR    . POLYPECTOMY  12/14/2019   Procedure: POLYPECTOMY;  Surgeon: Lucilla Lame, MD;  Location: [facility];   Service: Endoscopy;;  . TRANSURETHRAL RESECTION OF PROSTATE     2014    Family History  Problem Relation Age of Onset  . Prostate cancer Paternal Uncle   . Bladder Cancer Neg Hx   . Kidney cancer Neg Hx     Social History   Socioeconomic History  . Marital status: Married    Spouse name: Not on file  . Number of children: Not on file  . Years of education: Not on file  . Highest education level: Not on file  Occupational History  . Not on file  Tobacco Use  . Smoking status: Never Smoker  . Smokeless tobacco: Never Used  Vaping Use  . Vaping Use: Never used  Substance and Sexual Activity  . Alcohol use: Yes    Alcohol/week: 1.0 - 2.0 standard drink    Types: 1 - 2 Glasses of wine per week    Comment: ocassional glass of wine  . Drug use: No  . Sexual activity: Yes    Birth control/protection: None  Other Topics Concern  . Not on file  Social History Narrative  . Not on file   Social Determinants of Health   Financial Resource Strain: Not on file  Food Insecurity: Not on file  Transportation Needs: Not on file  Physical Activity: Not on file  Stress: Not on file  Social Connections: Not on file  Intimate Partner Violence: Not on file     Current Outpatient Medications:  .  aspirin EC 81 MG tablet, Take 81 mg by mouth., Disp: , Rfl:  .  atorvastatin (LIPITOR) 10 MG tablet, TAKE 1 TABLET EVERY DAY, Disp: 90 tablet, Rfl: 3 .  B Complex Vitamins (B COMPLEX 1 PO), Take by mouth. Takes 1 a week, Disp: , Rfl:  .  calcium carbonate (OSCAL) 1500 (600 Ca) MG TABS tablet, Take by mouth., Disp: , Rfl:  .  Cholecalciferol (VITAMIN D3) 2000 units capsule, Take by mouth., Disp: , Rfl:  .  clonazePAM (KLONOPIN) 0.5 MG tablet, TAKE ONE-HALF TABLET BY MOUTH AT BEDTIME, Disp: 30 tablet, Rfl: 0 .  Coenzyme Q10 100 MG capsule, Take 200 mg by mouth., Disp: , Rfl:  .  esomeprazole (NEXIUM) 40 MG capsule, , Disp: , Rfl:  .  finasteride (PROSCAR) 5 MG tablet, Take 1 tablet (5 mg  total) by mouth daily., Disp: 90 tablet, Rfl: 3 .  folic acid (FOLVITE) 585 MCG tablet, Take by mouth., Disp: , Rfl:  .  lisinopril (ZESTRIL) 10 MG tablet, Take 1 tablet (10 mg total) by mouth daily., Disp: 90 tablet, Rfl: 3 .  loratadine (CLARITIN) 10 MG tablet, Take 10 mg by mouth daily as needed. , Disp: , Rfl:  .  Melatonin 3 MG TABS, Take by mouth., Disp: , Rfl:  .  niacin (NIASPAN) 500 MG CR tablet, TAKE 4 TABLETS AT BEDTIME, Disp: 360 tablet, Rfl: 3 .  Omega-3 1000 MG CAPS, Take by mouth., Disp: , Rfl:  .  pseudoephedrine (SUDAFED) 30 MG tablet, Take by mouth., Disp: , Rfl:  .  sildenafil (VIAGRA) 100 MG tablet, TAKE ONE TABLET BY MOUTH ONE HOUR PRIOR TO INTERCOURSE AS NEEDED, Disp: 90 tablet, Rfl: 1 .  TOPROL XL 25 MG 24 hr tablet, , Disp: , Rfl:  .  vitamin C (ASCORBIC ACID) 500 MG tablet, Take 500 mg by mouth., Disp: , Rfl:    Allergies  Allergen Reactions  . Pollen Extract     seasonal  . Sulfa Antibiotics Other (See Comments)    Dizziness   . Sulfamethoxazole-Trimethoprim     dizzy dizzy   . Benadryl [Diphenhydramine Hcl (Sleep)] Anxiety    ROS Review of Systems  Constitutional: Negative.  Negative for chills, fatigue and fever.  HENT: Positive for congestion, rhinorrhea, sneezing and sore throat. Negative for ear discharge, ear pain and hearing loss.   Eyes: Negative.   Respiratory: Negative.   Cardiovascular: Negative.   Gastrointestinal: Negative.   Endocrine: Negative.   Genitourinary: Negative.   Musculoskeletal: Negative.   Skin: Negative.   Allergic/Immunologic: Negative.   Neurological: Negative.   Hematological: Negative.   Psychiatric/Behavioral: Negative.   All other systems reviewed and are negative.     Objective:    Physical Exam Vitals reviewed.  Constitutional:      Appearance: Normal appearance.  HENT:     Head: Normocephalic.     Right Ear: Tympanic membrane normal.     Left Ear: Tympanic membrane normal.     Nose: Congestion  present.     Mouth/Throat:     Mouth: Mucous membranes are dry.     Pharynx: Posterior oropharyngeal erythema present.  Eyes:     Pupils: Pupils are equal, round, and reactive to light.  Neck:     Vascular: No carotid bruit.  Cardiovascular:     Rate and Rhythm: Normal rate and regular rhythm.     Pulses: Normal  pulses.     Heart sounds: Normal heart sounds.  Pulmonary:     Effort: Pulmonary effort is normal.     Breath sounds: Normal breath sounds.  Abdominal:     General: Bowel sounds are normal.     Palpations: Abdomen is soft. There is no hepatomegaly, splenomegaly or mass.     Tenderness: There is no abdominal tenderness.     Hernia: No hernia is present.  Musculoskeletal:     Cervical back: Neck supple.     Right lower leg: No edema.     Left lower leg: No edema.  Skin:    Findings: No rash.  Neurological:     Mental Status: He is alert and oriented to person, place, and time.     Motor: No weakness.  Psychiatric:        Mood and Affect: Mood normal.        Behavior: Behavior normal.     BP 132/73   Pulse 91   Ht 5\' 2"  (1.575 m)   Wt 163 lb 11.2 oz (74.3 kg)   BMI 29.94 kg/m  Wt Readings from Last 3 Encounters:  07/01/20 163 lb 11.2 oz (74.3 kg)  05/08/20 155 lb (70.3 kg)  02/05/20 160 lb 8 oz (72.8 kg)     Health Maintenance Due  Topic Date Due  . PNA vac Low Risk Adult (2 of 2 - PCV13) 04/21/2011    There are no preventive care reminders to display for this patient.  Lab Results  Component Value Date   TSH 1.20 01/31/2020   Lab Results  Component Value Date   WBC 7.8 01/31/2020   HGB 13.6 01/31/2020   HCT 41.0 01/31/2020   MCV 97.4 01/31/2020   PLT 199 01/31/2020   Lab Results  Component Value Date   NA 134 (L) 01/31/2020   K 4.9 01/31/2020   CO2 26 01/31/2020   GLUCOSE 78 01/31/2020   BUN 20 01/31/2020   CREATININE 1.21 (H) 01/31/2020   BILITOT 0.4 01/31/2020   ALKPHOS 85 10/13/2017   AST 20 01/31/2020   ALT 13 01/31/2020   PROT  6.8 01/31/2020   ALBUMIN 4.3 10/13/2017   CALCIUM 9.5 01/31/2020   ANIONGAP 9 01/15/2016   Lab Results  Component Value Date   CHOL 142 01/31/2020   Lab Results  Component Value Date   HDL 68 01/31/2020   Lab Results  Component Value Date   LDLCALC 61 01/31/2020   Lab Results  Component Value Date   TRIG 54 01/31/2020   Lab Results  Component Value Date   CHOLHDL 2.1 01/31/2020   No results found for: HGBA1C    Assessment & Plan:   Problem List Items Addressed This Visit      Cardiovascular and Mediastinum   Essential hypertension - Primary    Patient blood pressure is normal patient denies any chest pain or shortness of breath there is no history of palpitation paroxysmal nocturnal dyspnea patient can walkioo yards without any problem patient was advised to follow low-salt low-cholesterol diet  I reviewed the results of Sprint trial  ideally I want to keep systolic blood pressure below 130 mmHg, patient was asked to check blood pressure 3 times a week and give me a report on that.  Patient will be follow-up in 3 months, patient will call me back for any change in the cardiovascular symptoms           Respiratory   OSA (obstructive sleep apnea)  Stable at the present time      Acute upper respiratory infection    Patient was started on Tussionex half a teaspoonful twice a day        Other   Hyperlipidemia    Hypercholesterolemia  I advised the patient to follow Mediterranean diet This diet is rich in fruits vegetables and whole grain, and This diet is also rich in fish and lean meat Patient should also eat a handful of almonds or walnuts daily Recent heart study indicated that average follow-up on this kind of diet reduces the cardiovascular mortality by 50 to 70%==       Meal Ideas from [place]:  Whole wheat toast or whole wheat English muffins with peanut butter & hard boiled egg Steel cut oats topped with apples & cinnamon and  skim milk  Fresh fruit: banana, strawberries, melon, berries, peaches  Smoothies: strawberries, bananas, greek yogurt, peanut butter Low fat greek yogurt with blueberries and granola  Egg white omelet with spinach and mushrooms Breakfast couscous: whole wheat couscous, apricots, skim milk, cranberries  Sandwiches:  Hummus and grilled vegetables (peppers, zucchini, squash) on whole wheat bread   Grilled chicken on whole wheat pita with lettuce, tomatoes, cucumbers or tzatziki  [place] salad on whole wheat bread: tuna salad made with greek yogurt, olives, red peppers, capers, green onions Garlic rosemary lamb pita: lamb sauted with garlic, rosemary, salt & pepper; add lettuce, cucumber, greek yogurt to pita - flavor with lemon juice and black pepper  Seafood:  Mediterranean grilled salmon, seasoned with garlic, basil, parsley, lemon juice and black pepper Shrimp, lemon, and spinach whole-grain pasta salad made with low fat greek yogurt  Seared scallops with lemon orzo  Seared tuna steaks seasoned salt, pepper, coriander topped with tomato mixture of olives, tomatoes, olive oil, minced garlic, parsley, green onions and cappers  Meats:  Herbed greek chicken salad with kalamata olives, cucumber, feta  Red bell peppers stuffed with spinach, bulgur, lean ground beef (or lentils) & topped with feta   Kebabs: skewers of chicken, tomatoes, onions, zucchini, squash  [place] burgers: made with red onions, mint, dill, lemon juice, feta cheese topped with roasted red peppers Vegetarian Cucumber salad: cucumbers, artichoke hearts, celery, red onion, feta cheese, tossed in olive oil & lemon juice  Hummus and whole grain pita points with a greek salad (lettuce, tomato, feta, olives, cucumbers, red onion) Lentil soup with celery, carrots made with vegetable broth, garlic, salt and pepper  Tabouli salad: parsley, bulgur, mint, scallions, cucumbers, tomato, radishes, lemon juice, olive oil, salt and pepper.  No  orders of the defined types were placed in this encounter.   Follow-up: No follow-ups on file.    Cletis Athens, MD

## 2020-07-01 NOTE — Assessment & Plan Note (Signed)

## 2020-07-01 NOTE — Assessment & Plan Note (Signed)
Hypercholesterolemia  I advised the patient to follow Mediterranean diet This diet is rich in fruits vegetables and whole grain, and This diet is also rich in fish and lean meat Patient should also eat a handful of almonds or walnuts daily Recent heart study indicated that average follow-up on this kind of diet reduces the cardiovascular mortality by 50 to 70%== 

## 2020-07-03 ENCOUNTER — Other Ambulatory Visit: Payer: Self-pay | Admitting: Internal Medicine

## 2020-07-03 DIAGNOSIS — I10 Essential (primary) hypertension: Secondary | ICD-10-CM

## 2020-07-07 ENCOUNTER — Other Ambulatory Visit: Payer: Self-pay | Admitting: Internal Medicine

## 2020-08-21 ENCOUNTER — Other Ambulatory Visit: Payer: Self-pay | Admitting: Internal Medicine

## 2020-08-21 DIAGNOSIS — F419 Anxiety disorder, unspecified: Secondary | ICD-10-CM

## 2020-10-24 ENCOUNTER — Other Ambulatory Visit: Payer: Self-pay | Admitting: Family Medicine

## 2020-10-24 DIAGNOSIS — F419 Anxiety disorder, unspecified: Secondary | ICD-10-CM

## 2020-11-10 ENCOUNTER — Other Ambulatory Visit: Payer: Self-pay | Admitting: Family Medicine

## 2020-11-10 DIAGNOSIS — F419 Anxiety disorder, unspecified: Secondary | ICD-10-CM

## 2020-11-12 ENCOUNTER — Other Ambulatory Visit: Payer: Self-pay | Admitting: Internal Medicine

## 2021-01-05 ENCOUNTER — Other Ambulatory Visit: Payer: Self-pay | Admitting: *Deleted

## 2021-01-05 DIAGNOSIS — F419 Anxiety disorder, unspecified: Secondary | ICD-10-CM

## 2021-01-05 MED ORDER — CLONAZEPAM 0.5 MG PO TABS: ORAL_TABLET | ORAL | 1 refills | Status: DC

## 2021-02-09 ENCOUNTER — Encounter: Payer: Medicare PPO | Admitting: Internal Medicine

## 2021-02-10 ENCOUNTER — Other Ambulatory Visit: Payer: Self-pay

## 2021-02-10 ENCOUNTER — Other Ambulatory Visit (INDEPENDENT_AMBULATORY_CARE_PROVIDER_SITE_OTHER): Payer: Medicare PPO

## 2021-02-10 DIAGNOSIS — I10 Essential (primary) hypertension: Secondary | ICD-10-CM

## 2021-02-10 DIAGNOSIS — E782 Mixed hyperlipidemia: Secondary | ICD-10-CM | POA: Diagnosis not present

## 2021-02-10 DIAGNOSIS — R972 Elevated prostate specific antigen [PSA]: Secondary | ICD-10-CM

## 2021-02-11 LAB — TSH: TSH: 0.91 mIU/L (ref 0.40–4.50)

## 2021-02-11 LAB — CBC WITH DIFFERENTIAL/PLATELET
Absolute Monocytes: 800 cells/uL (ref 200–950)
Basophils Absolute: 43 cells/uL (ref 0–200)
Basophils Relative: 0.5 %
Eosinophils Absolute: 413 cells/uL (ref 15–500)
Eosinophils Relative: 4.8 %
HCT: 37.3 % — ABNORMAL LOW (ref 38.5–50.0)
Hemoglobin: 12.3 g/dL — ABNORMAL LOW (ref 13.2–17.1)
Lymphs Abs: 1797 cells/uL (ref 850–3900)
MCH: 30.4 pg (ref 27.0–33.0)
MCHC: 33 g/dL (ref 32.0–36.0)
MCV: 92.3 fL (ref 80.0–100.0)
MPV: 12.7 fL — ABNORMAL HIGH (ref 7.5–12.5)
Monocytes Relative: 9.3 %
Neutro Abs: 5547 cells/uL (ref 1500–7800)
Neutrophils Relative %: 64.5 %
Platelets: 195 10*3/uL (ref 140–400)
RBC: 4.04 10*6/uL — ABNORMAL LOW (ref 4.20–5.80)
RDW: 12.3 % (ref 11.0–15.0)
Total Lymphocyte: 20.9 %
WBC: 8.6 10*3/uL (ref 3.8–10.8)

## 2021-02-11 LAB — LIPID PANEL
Cholesterol: 149 mg/dL (ref <200)
HDL: 73 mg/dL (ref >=40)
LDL Cholesterol (Calc): 61 mg/dL (calc)
Non-HDL Cholesterol (Calc): 76 mg/dL (calc) (ref <130)
Total CHOL/HDL Ratio: 2 (calc) (ref <5.0)
Triglycerides: 74 mg/dL (ref <150)

## 2021-02-11 LAB — COMPLETE METABOLIC PANEL WITH GFR
AG Ratio: 1.6 (calc) (ref 1.0–2.5)
ALT: 12 U/L (ref 9–46)
AST: 17 U/L (ref 10–35)
Albumin: 4.2 g/dL (ref 3.6–5.1)
Alkaline phosphatase (APISO): 68 U/L (ref 35–144)
BUN: 20 mg/dL (ref 7–25)
CO2: 22 mmol/L (ref 20–32)
Calcium: 9.5 mg/dL (ref 8.6–10.3)
Chloride: 105 mmol/L (ref 98–110)
Creat: 1.17 mg/dL (ref 0.70–1.28)
Globulin: 2.7 g/dL (calc) (ref 1.9–3.7)
Glucose, Bld: 96 mg/dL (ref 65–99)
Potassium: 4.4 mmol/L (ref 3.5–5.3)
Sodium: 139 mmol/L (ref 135–146)
Total Bilirubin: 0.5 mg/dL (ref 0.2–1.2)
Total Protein: 6.9 g/dL (ref 6.1–8.1)
eGFR: 65 mL/min/{1.73_m2} (ref >=60)

## 2021-02-11 LAB — PSA: PSA: 3.44 ng/mL (ref <=4.00)

## 2021-02-16 ENCOUNTER — Ambulatory Visit (INDEPENDENT_AMBULATORY_CARE_PROVIDER_SITE_OTHER): Payer: Medicare PPO | Admitting: Internal Medicine

## 2021-02-16 ENCOUNTER — Other Ambulatory Visit: Payer: Self-pay

## 2021-02-16 ENCOUNTER — Encounter: Payer: Self-pay | Admitting: Internal Medicine

## 2021-02-16 VITALS — BP 141/80 | HR 82 | Ht 62.0 in | Wt 162.9 lb

## 2021-02-16 DIAGNOSIS — I2510 Atherosclerotic heart disease of native coronary artery without angina pectoris: Secondary | ICD-10-CM | POA: Diagnosis not present

## 2021-02-16 DIAGNOSIS — K2100 Gastro-esophageal reflux disease with esophagitis, without bleeding: Secondary | ICD-10-CM

## 2021-02-16 DIAGNOSIS — Z23 Encounter for immunization: Secondary | ICD-10-CM | POA: Diagnosis not present

## 2021-02-16 DIAGNOSIS — N138 Other obstructive and reflux uropathy: Secondary | ICD-10-CM

## 2021-02-16 DIAGNOSIS — I10 Essential (primary) hypertension: Secondary | ICD-10-CM | POA: Diagnosis not present

## 2021-02-16 DIAGNOSIS — I2583 Coronary atherosclerosis due to lipid rich plaque: Secondary | ICD-10-CM | POA: Diagnosis not present

## 2021-02-16 DIAGNOSIS — N401 Enlarged prostate with lower urinary tract symptoms: Secondary | ICD-10-CM | POA: Diagnosis not present

## 2021-02-16 DIAGNOSIS — Z0000 Encounter for general adult medical examination without abnormal findings: Secondary | ICD-10-CM

## 2021-02-16 MED ORDER — ESOMEPRAZOLE MAGNESIUM 40 MG PO CPDR
40.0000 mg | DELAYED_RELEASE_CAPSULE | Freq: Every day | ORAL | 2 refills | Status: DC
Start: 2021-02-16 — End: 2021-11-09

## 2021-02-16 NOTE — Assessment & Plan Note (Signed)
Patient general physical examination is normal he denies any chest pain or shortness of breath.  Heart is regular chest is clear abdomen is soft nontender there is no hepatosplenomegaly there is no pedal edema no calf tenderness.  Carotids are 2+ without any bruit.  Prostate examination will be checked by Dr. Bernardo Heater in 2 months

## 2021-02-16 NOTE — Progress Notes (Signed)
Established Patient Office Visit  Subjective:  Patient ID: Bruce Gomez, male    DOB: 06/10/1945  Age: 75 y.o. MRN: [id number]  CC:  Chief Complaint  Patient presents with   Annual Exam    HPI  Bruce Gomez presents for physical  Past Medical History:  Diagnosis Date   Actinic keratosis    Aortic sclerosis 04/21/2017   Overview:  Moderate Aortic Sclerosis, mild LVH,  mild Mitral valve thickening, Slight LAE on ECHO - started  on ACE  for  LVH   Arthritis    hips and lower back   BPH with obstruction/lower urinary tract symptoms 03/19/2014   BPH with obstruction/lower urinary tract symptoms 03/19/2014   CAD (coronary artery disease) 04/21/2017   Overview:   silent MI - Inferior; s/p CABG x 3; Stress Echo  6/05; 8/08;  2/12   Chronic prostatitis 11/03/2000   Coronary artery disease    Elevated PSA 03/19/2014   Erectile dysfunction 03/19/2014   History of basal cell carcinoma 07/02/2015   right nose   History of dysplastic nevus 06/22/2017   left ant deltoid   Hyperlipidemia 04/21/2017   Hypertension    Impaired glucose tolerance 04/21/2017   Myocardial infarction ([place]) 2002   OSA (obstructive sleep apnea) 04/21/2017   Overview:  rx with wt loss, side sleeping-no CPAP, not true sleep apnea   RLS (restless legs syndrome) 04/21/2017    Past Surgical History:  Procedure Laterality Date   ADENOIDECTOMY     CARDIAC SURGERY     cabg 2002   COLONOSCOPY WITH PROPOFOL N/A 12/14/2019   Procedure: COLONOSCOPY WITH PROPOFOL;  Surgeon: Lucilla Lame, MD;  Location: [place];  Service: Endoscopy;  Laterality: N/A;  priority 4   CORONARY ARTERY BYPASS GRAFT  2002   x3   INGUINAL HERNIA REPAIR     POLYPECTOMY  12/14/2019   Procedure: POLYPECTOMY;  Surgeon: Lucilla Lame, MD;  Location: [place];  Service: Endoscopy;;   TRANSURETHRAL RESECTION OF PROSTATE     2014    Family History  Problem Relation Age of Onset   Prostate cancer Paternal Uncle    Bladder  Cancer Neg Hx    Kidney cancer Neg Hx     Social History   Socioeconomic History   Marital status: Married    Spouse name: Not on file   Number of children: Not on file   Years of education: Not on file   Highest education level: Not on file  Occupational History   Not on file  Tobacco Use   Smoking status: Never   Smokeless tobacco: Never  Vaping Use   Vaping Use: Never used  Substance and Sexual Activity   Alcohol use: Yes    Alcohol/week: 1.0 - 2.0 standard drink    Types: 1 - 2 Glasses of wine per week    Comment: ocassional glass of wine   Drug use: No   Sexual activity: Yes    Birth control/protection: None  Other Topics Concern   Not on file  Social History Narrative   Not on file   Social Determinants of Health   Financial Resource Strain: Not on file  Food Insecurity: Not on file  Transportation Needs: Not on file  Physical Activity: Not on file  Stress: Not on file  Social Connections: Not on file  Intimate Partner Violence: Not on file     Current Outpatient Medications:    aspirin EC 81 MG tablet, Take 81 mg by mouth.,  Disp: , Rfl:    atorvastatin (LIPITOR) 10 MG tablet, TAKE 1 TABLET EVERY DAY, Disp: 90 tablet, Rfl: 3   B Complex Vitamins (B COMPLEX 1 PO), Take by mouth. Takes 1 a week, Disp: , Rfl:    calcium carbonate (OSCAL) 1500 (600 Ca) MG TABS tablet, Take by mouth., Disp: , Rfl:    Cholecalciferol (VITAMIN D3) 2000 units capsule, Take by mouth., Disp: , Rfl:    clonazePAM (KLONOPIN) 0.5 MG tablet, TAKE 1/2 (ONE-HALF) TABLET BY MOUTH AT BEDTIME, Disp: 15 tablet, Rfl: 1   Coenzyme Q10 100 MG capsule, Take 200 mg by mouth., Disp: , Rfl:    esomeprazole (NEXIUM) 40 MG capsule, Take 1 capsule (40 mg total) by mouth daily., Disp: 90 capsule, Rfl: 2   finasteride (PROSCAR) 5 MG tablet, Take 1 tablet (5 mg total) by mouth daily., Disp: 90 tablet, Rfl: 3   folic acid (FOLVITE) 086 MCG tablet, Take by mouth., Disp: , Rfl:    lisinopril (ZESTRIL) 10 MG  tablet, TAKE 1 TABLET EVERY DAY, Disp: 90 tablet, Rfl: 3   loratadine (CLARITIN) 10 MG tablet, Take 10 mg by mouth daily as needed. , Disp: , Rfl:    Melatonin 3 MG TABS, Take by mouth., Disp: , Rfl:    niacin (NIASPAN) 500 MG CR tablet, TAKE 4 TABLETS AT BEDTIME, Disp: 360 tablet, Rfl: 3   Omega-3 1000 MG CAPS, Take by mouth., Disp: , Rfl:    pseudoephedrine (SUDAFED) 30 MG tablet, Take by mouth., Disp: , Rfl:    sildenafil (VIAGRA) 100 MG tablet, TAKE ONE TABLET BY MOUTH ONE HOUR PRIOR TO INTERCOURSE AS NEEDED, Disp: 90 tablet, Rfl: 1   TOPROL XL 25 MG 24 hr tablet, , Disp: , Rfl:    vitamin C (ASCORBIC ACID) 500 MG tablet, Take 500 mg by mouth., Disp: , Rfl:    Allergies  Allergen Reactions   Pollen Extract     seasonal   Sulfa Antibiotics Other (See Comments)    Dizziness    Sulfamethoxazole-Trimethoprim     dizzy dizzy    Benadryl [Diphenhydramine Hcl (Sleep)] Anxiety    ROS Review of Systems  Constitutional: Negative.   HENT: Negative.    Eyes: Negative.   Respiratory: Negative.    Cardiovascular: Negative.   Gastrointestinal: Negative.   Endocrine: Negative.   Genitourinary: Negative.   Musculoskeletal: Negative.   Skin: Negative.   Allergic/Immunologic: Negative.   Neurological: Negative.   Hematological: Negative.   Psychiatric/Behavioral: Negative.    All other systems reviewed and are negative.    Objective:    Physical Exam Vitals reviewed.  Constitutional:      Appearance: Normal appearance.  HENT:     Mouth/Throat:     Mouth: Mucous membranes are moist.  Eyes:     Pupils: Pupils are equal, round, and reactive to light.  Neck:     Vascular: No carotid bruit.  Cardiovascular:     Rate and Rhythm: Normal rate and regular rhythm.     Pulses: Normal pulses.     Heart sounds: Normal heart sounds.  Pulmonary:     Effort: Pulmonary effort is normal.     Breath sounds: Normal breath sounds.  Abdominal:     General: Bowel sounds are normal.      Palpations: Abdomen is soft. There is no hepatomegaly, splenomegaly or mass.     Tenderness: There is no abdominal tenderness.     Hernia: No hernia is present.  Musculoskeletal:  Cervical back: Neck supple.     Right lower leg: No edema.     Left lower leg: No edema.  Skin:    Findings: No rash.  Neurological:     Mental Status: He is alert and oriented to person, place, and time.     Motor: No weakness.  Psychiatric:        Mood and Affect: Mood normal.        Behavior: Behavior normal.    BP (!) 141/80   Pulse 82   Ht _0  (1.575 m)   Wt 162 lb 14.4 oz (73.9 kg)   BMI 29.79 kg/m  Wt Readings from Last 3 Encounters:  02/16/21 162 lb 14.4 oz (73.9 kg)  07/01/20 163 lb 11.2 oz (74.3 kg)  05/08/20 155 lb (70.3 kg)     Health Maintenance Due  Topic Date Due   TETANUS/TDAP  12/19/2020    There are no preventive care reminders to display for this patient.  Lab Results  Component Value Date   TSH 0.91 02/10/2021   Lab Results  Component Value Date   WBC 8.6 02/10/2021   HGB 12.3 (L) 02/10/2021   HCT 37.3 (L) 02/10/2021   MCV 92.3 02/10/2021   PLT 195 02/10/2021   Lab Results  Component Value Date   NA 139 02/10/2021   K 4.4 02/10/2021   CO2 22 02/10/2021   GLUCOSE 96 02/10/2021   BUN 20 02/10/2021   CREATININE 1.17 02/10/2021   BILITOT 0.5 02/10/2021   ALKPHOS 85 10/13/2017   AST 17 02/10/2021   ALT 12 02/10/2021   PROT 6.9 02/10/2021   ALBUMIN 4.3 10/13/2017   CALCIUM 9.5 02/10/2021   ANIONGAP 9 01/15/2016   EGFR 65 02/10/2021   Lab Results  Component Value Date   CHOL 149 02/10/2021   Lab Results  Component Value Date   HDL 73 02/10/2021   Lab Results  Component Value Date   LDLCALC 61 02/10/2021   Lab Results  Component Value Date   TRIG 74 02/10/2021   Lab Results  Component Value Date   CHOLHDL 2.0 02/10/2021   No results found for: HGBA1C    Assessment & Plan:   Problem List Items Addressed This Visit        Cardiovascular and Mediastinum   CAD (coronary artery disease)    Patient denies any angina      Essential hypertension     Patient denies any chest pain or shortness of breath there is no history of palpitation or paroxysmal nocturnal dyspnea   patient was advised to follow low-salt low-cholesterol diet    ideally I want to keep systolic blood pressure below 130 mmHg, patient was asked to check blood pressure one times a week and give me a report on that.  Patient will be follow-up in 3 months  or earlier as needed, patient will call me back for any change in the cardiovascular symptoms Patient was advised to buy a book from local bookstore concerning blood pressure and read several chapters  every day.  This will be supplemented by some of the material we will give him from the office.  Patient should also utilize other resources like YouTube and Internet to learn more about the blood pressure and the diet.        Genitourinary   BPH with obstruction/lower urinary tract symptoms    Refer to Dr. Bernardo Heater        Other   Annual physical exam  Patient general physical examination is normal he denies any chest pain or shortness of breath.  Heart is regular chest is clear abdomen is soft nontender there is no hepatosplenomegaly there is no pedal edema no calf tenderness.  Carotids are 2+ without any bruit.  Prostate examination will be checked by Dr. Bernardo Heater in 2 months      Other Visit Diagnoses     Need for pneumococcal vaccination    -  Primary   Relevant Orders   Pneumococcal conjugate vaccine 20-valent (Prevnar 20) (Completed)       Meds ordered this encounter  Medications   esomeprazole (NEXIUM) 40 MG capsule    Sig: Take 1 capsule (40 mg total) by mouth daily.    Dispense:  90 capsule    Refill:  2    Follow-up: No follow-ups on file.    Cletis Athens, MD

## 2021-02-16 NOTE — Assessment & Plan Note (Signed)

## 2021-02-16 NOTE — Assessment & Plan Note (Signed)
Patient denies any angina

## 2021-02-16 NOTE — Assessment & Plan Note (Signed)
Refer to Dr. Bernardo Heater

## 2021-03-07 ENCOUNTER — Other Ambulatory Visit: Payer: Self-pay | Admitting: Internal Medicine

## 2021-03-07 DIAGNOSIS — F419 Anxiety disorder, unspecified: Secondary | ICD-10-CM

## 2021-03-13 ENCOUNTER — Other Ambulatory Visit: Payer: Self-pay | Admitting: Urology

## 2021-03-13 DIAGNOSIS — N138 Other obstructive and reflux uropathy: Secondary | ICD-10-CM

## 2021-03-13 DIAGNOSIS — R972 Elevated prostate specific antigen [PSA]: Secondary | ICD-10-CM

## 2021-03-19 ENCOUNTER — Other Ambulatory Visit: Payer: Self-pay

## 2021-03-19 ENCOUNTER — Ambulatory Visit (INDEPENDENT_AMBULATORY_CARE_PROVIDER_SITE_OTHER): Payer: Medicare PPO | Admitting: Dermatology

## 2021-03-19 DIAGNOSIS — L578 Other skin changes due to chronic exposure to nonionizing radiation: Secondary | ICD-10-CM | POA: Diagnosis not present

## 2021-03-19 DIAGNOSIS — D1800 Hemangioma unspecified site: Secondary | ICD-10-CM

## 2021-03-19 DIAGNOSIS — L820 Inflamed seborrheic keratosis: Secondary | ICD-10-CM | POA: Diagnosis not present

## 2021-03-19 DIAGNOSIS — Z1283 Encounter for screening for malignant neoplasm of skin: Secondary | ICD-10-CM

## 2021-03-19 DIAGNOSIS — Z85828 Personal history of other malignant neoplasm of skin: Secondary | ICD-10-CM

## 2021-03-19 DIAGNOSIS — D229 Melanocytic nevi, unspecified: Secondary | ICD-10-CM

## 2021-03-19 DIAGNOSIS — L570 Actinic keratosis: Secondary | ICD-10-CM

## 2021-03-19 DIAGNOSIS — L821 Other seborrheic keratosis: Secondary | ICD-10-CM

## 2021-03-19 DIAGNOSIS — L814 Other melanin hyperpigmentation: Secondary | ICD-10-CM

## 2021-03-19 DIAGNOSIS — Z86018 Personal history of other benign neoplasm: Secondary | ICD-10-CM

## 2021-03-19 NOTE — Patient Instructions (Addendum)
Prior to procedure, discussed risks of blister formation, small wound, skin dyspigmentation, or rare scar following cryotherapy. Recommend Vaseline ointment to treated areas while healing.     If You Need Anything After Your Visit  If you have any questions or concerns for your doctor, please call our main line at [phone] and press option 4 to reach your doctor's medical assistant. If no one answers, please leave a voicemail as directed and we will return your call as soon as possible. Messages left after 4 pm will be answered the following business day.   You may also send us a message via MyChart. We typically respond to MyChart messages within 1-2 business days.  For prescription refills, please ask your pharmacy to contact our office. Our fax number is [phone].  If you have an urgent issue when the clinic is closed that cannot wait until the next business day, you can page your doctor at the number below.    Please note that while we do our best to be available for urgent issues outside of office hours, we are not available 24/7.   If you have an urgent issue and are unable to reach us, you may choose to seek medical care at your doctor's office, retail clinic, urgent care center, or emergency room.  If you have a medical emergency, please immediately call 911 or go to the emergency department.  Pager Numbers  - Dr. Kowalski: [phone]  - Dr. Moye: [phone]  - Dr. Stewart: [phone]  In the event of inclement weather, please call our main line at [phone] for an update on the status of any delays or closures.  Dermatology Medication Tips: Please keep the boxes that topical medications come in in order to help keep track of the instructions about where and how to use these. Pharmacies typically print the medication instructions only on the boxes and not directly on the medication tubes.   If your medication is too expensive, please contact our office at  [phone] option 4 or send us a message through MyChart.   We are unable to tell what your co-pay for medications will be in advance as this is different depending on your insurance coverage. However, we may be able to find a substitute medication at lower cost or fill out paperwork to get insurance to cover a needed medication.   If a prior authorization is required to get your medication covered by your insurance company, please allow us 1-2 business days to complete this process.  Drug prices often vary depending on where the prescription is filled and some pharmacies may offer cheaper prices.  The website www.goodrx.com contains coupons for medications through different pharmacies. The prices here do not account for what the cost may be with help from insurance (it may be cheaper with your insurance), but the website can give you the price if you did not use any insurance.  - You can print the associated coupon and take it with your prescription to the pharmacy.  - You may also stop by our office during regular business hours and pick up a GoodRx coupon card.  - If you need your prescription sent electronically to a different pharmacy, notify our office through [facility] MyChart or by phone at [phone] option 4.     Si Usted Necesita Algo Despus de Su Visita  Tambin puede enviarnos un mensaje a travs de MyChart. Por lo general respondemos a los mensajes de MyChart en el transcurso de 1 a 2 das   hbiles.  Para renovar recetas, por favor pida a su farmacia que se ponga en contacto con nuestra oficina. Nuestro nmero de fax es el [phone].  Si tiene un asunto urgente cuando la clnica est cerrada y que no puede esperar hasta el siguiente da hbil, puede llamar/localizar a su doctor(a) al nmero que aparece a continuacin.   Por favor, tenga en cuenta que aunque hacemos todo lo posible para estar disponibles para asuntos urgentes fuera del horario de oficina, no estamos  disponibles las 24 horas del da, los 7 das de la semana.   Si tiene un problema urgente y no puede comunicarse con nosotros, puede optar por buscar atencin mdica  en el consultorio de su doctor(a), en una clnica privada, en un centro de atencin urgente o en una sala de emergencias.  Si tiene una emergencia mdica, por favor llame inmediatamente al 911 o vaya a la sala de emergencias.  Nmeros de bper  - Dr. Kowalski: [phone]  - Dra. Moye: [phone]  - Dra. Stewart: [phone]  En caso de inclemencias del tiempo, por favor llame a nuestra lnea principal al [phone] para una actualizacin sobre el estado de cualquier retraso o cierre.  Consejos para la medicacin en dermatologa: Por favor, guarde las cajas en las que vienen los medicamentos de uso tpico para ayudarle a seguir las instrucciones sobre dnde y cmo usarlos. Las farmacias generalmente imprimen las instrucciones del medicamento slo en las cajas y no directamente en los tubos del medicamento.   Si su medicamento es muy caro, por favor, pngase en contacto con nuestra oficina llamando al [phone] y presione la opcin 4 o envenos un mensaje a travs de MyChart.   No podemos decirle cul ser su copago por los medicamentos por adelantado ya que esto es diferente dependiendo de la cobertura de su seguro. Sin embargo, es posible que podamos encontrar un medicamento sustituto a menor costo o llenar un formulario para que el seguro cubra el medicamento que se considera necesario.   Si se requiere una autorizacin previa para que su compaa de seguros cubra su medicamento, por favor permtanos de 1 a 2 das hbiles para completar este proceso.  Los precios de los medicamentos varan con frecuencia dependiendo del lugar de dnde se surte la receta y alguna farmacias pueden ofrecer precios ms baratos.  El sitio web www.goodrx.com tiene cupones para medicamentos de diferentes farmacias. Los precios aqu no  tienen en cuenta lo que podra costar con la ayuda del seguro (puede ser ms barato con su seguro), pero el sitio web puede darle el precio si no utiliz ningn seguro.  - Puede imprimir el cupn correspondiente y llevarlo con su receta a la farmacia.  - Tambin puede pasar por nuestra oficina durante el horario de atencin regular y recoger una tarjeta de cupones de GoodRx.  - Si necesita que su receta se enve electrnicamente a una farmacia diferente, informe a nuestra oficina a travs de MyChart de [facility] o por telfono llamando al [phone] y presione la opcin 4.  

## 2021-03-19 NOTE — Progress Notes (Signed)
Follow-Up Visit   Subjective  Bruce Gomez is a 75 y.o. male who presents for the following: Annual Exam (Mole check ). Hx of Dysplastic nevus. Hx of BCC The patient presents for Total-Body Skin Exam (TBSE) for skin cancer screening and mole check.  The patient has spots, moles and lesions to be evaluated, some may be new or changing and the patient has concerns that these could be cancer.   The following portions of the chart were reviewed this encounter and updated as appropriate:   Tobacco   Allergies   Meds   Problems   Med Hx   Surg Hx   Fam Hx      Review of Systems:  No other skin or systemic complaints except as noted in HPI or Assessment and Plan.  Objective  Well appearing patient in no apparent distress; mood and affect are within normal limits.  A full examination was performed including scalp, head, eyes, ears, nose, lips, neck, chest, axillae, abdomen, back, buttocks, bilateral upper extremities, bilateral lower extremities, hands, feet, fingers, toes, fingernails, and toenails. All findings within normal limits unless otherwise noted below.  arms, hands x 7 Stuck-on, waxy, tan-brown papules   face x 15 (15) Erythematous thin papules/macules with gritty scale.    Assessment & Plan  Inflamed seborrheic keratosis arms, hands x 7 Reassured benign age-related growth.  Recommend observation.  Discussed cryotherapy if spot(s) become irritated or inflamed.   Prior to procedure, discussed risks of blister formation, small wound, skin dyspigmentation, or rare scar following cryotherapy. Recommend Vaseline ointment to treated areas while healing.   Destruction of lesion - arms, hands x 7 Complexity: simple   Destruction method: cryotherapy   Informed consent: discussed and consent obtained   Timeout:  patient name, date of birth, surgical site, and procedure verified Lesion destroyed using liquid nitrogen: Yes   Region frozen until ice ball extended beyond lesion: Yes    Outcome: patient tolerated procedure well with no complications   Post-procedure details: wound care instructions given    AK (actinic keratosis) (15) face x 15 Actinic keratoses are precancerous spots that appear secondary to cumulative UV radiation exposure/sun exposure over time. They are chronic with expected duration over 1 year. A portion of actinic keratoses will progress to squamous cell carcinoma of the skin. It is not possible to reliably predict which spots will progress to skin cancer and so treatment is recommended to prevent development of skin cancer.  Recommend daily broad spectrum sunscreen SPF 30+ to sun-exposed areas, reapply every 2 hours as needed.  Recommend staying in the shade or wearing long sleeves, sun glasses (UVA+UVB protection) and wide brim hats (4-inch brim around the entire circumference of the hat). Call for new or changing lesions.   Destruction of lesion - face x 15 Complexity: simple   Destruction method: cryotherapy   Informed consent: discussed and consent obtained   Timeout:  patient name, date of birth, surgical site, and procedure verified Lesion destroyed using liquid nitrogen: Yes   Region frozen until ice ball extended beyond lesion: Yes   Outcome: patient tolerated procedure well with no complications   Post-procedure details: wound care instructions given    Skin cancer screening  Lentigines - Scattered tan macules - Due to sun exposure - Benign-appearing, observe - Recommend daily broad spectrum sunscreen SPF 30+ to sun-exposed areas, reapply every 2 hours as needed. - Call for any changes  Seborrheic Keratoses - Stuck-on, waxy, tan-brown papules and/or plaques  - Benign-appearing -  Discussed benign etiology and prognosis. - Observe - Call for any changes  Melanocytic Nevi - Tan-brown and/or pink-flesh-colored symmetric macules and papules - Benign appearing on exam today - Observation - Call clinic for new or changing  moles - Recommend daily use of broad spectrum spf 30+ sunscreen to sun-exposed areas.   Hemangiomas - Red papules - Discussed benign nature - Observe - Call for any changes  Actinic Damage - Chronic condition, secondary to cumulative UV/sun exposure - diffuse scaly erythematous macules with underlying dyspigmentation - Recommend daily broad spectrum sunscreen SPF 30+ to sun-exposed areas, reapply every 2 hours as needed.  - Staying in the shade or wearing long sleeves, sun glasses (UVA+UVB protection) and wide brim hats (4-inch brim around the entire circumference of the hat) are also recommended for sun protection.  - Call for new or changing lesions.  History of Dysplastic Nevi Left ant deltoid  - No evidence of recurrence today - Recommend regular full body skin exams - Recommend daily broad spectrum sunscreen SPF 30+ to sun-exposed areas, reapply every 2 hours as needed.  - Call if any new or changing lesions are noted between office visits   History of Basal Cell Carcinoma of the Skin Right nose 2017 - No evidence of recurrence today - Recommend regular full body skin exams - Recommend daily broad spectrum sunscreen SPF 30+ to sun-exposed areas, reapply every 2 hours as needed.  - Call if any new or changing lesions are noted between office visits   Skin cancer screening performed today.   Return in about 6 months (around 09/17/2021) for UBSE, hx of Aks, .  I, [name], CMA, am acting as scribe for Bruce Ser, MD .  Documentation: I have reviewed the above documentation for accuracy and completeness, and I agree with the above.  Bruce Ser, MD

## 2021-03-24 ENCOUNTER — Encounter: Payer: Self-pay | Admitting: Dermatology

## 2021-03-24 ENCOUNTER — Ambulatory Visit (INDEPENDENT_AMBULATORY_CARE_PROVIDER_SITE_OTHER): Payer: Medicare PPO | Admitting: *Deleted

## 2021-03-24 ENCOUNTER — Other Ambulatory Visit: Payer: Self-pay

## 2021-03-24 DIAGNOSIS — Z23 Encounter for immunization: Secondary | ICD-10-CM | POA: Diagnosis not present

## 2021-04-05 HISTORY — PX: OTHER SURGICAL HISTORY: SHX169

## 2021-04-13 ENCOUNTER — Other Ambulatory Visit: Payer: Self-pay | Admitting: *Deleted

## 2021-04-13 DIAGNOSIS — F419 Anxiety disorder, unspecified: Secondary | ICD-10-CM

## 2021-04-13 MED ORDER — CLONAZEPAM 0.5 MG PO TABS: ORAL_TABLET | ORAL | 1 refills | Status: DC

## 2021-04-14 ENCOUNTER — Other Ambulatory Visit: Payer: Self-pay | Admitting: Internal Medicine

## 2021-04-14 DIAGNOSIS — F419 Anxiety disorder, unspecified: Secondary | ICD-10-CM

## 2021-04-20 DIAGNOSIS — M79644 Pain in right finger(s): Secondary | ICD-10-CM | POA: Diagnosis not present

## 2021-04-20 DIAGNOSIS — E669 Obesity, unspecified: Secondary | ICD-10-CM | POA: Diagnosis not present

## 2021-04-20 DIAGNOSIS — S62511A Displaced fracture of proximal phalanx of right thumb, initial encounter for closed fracture: Secondary | ICD-10-CM | POA: Diagnosis not present

## 2021-04-22 DIAGNOSIS — S62511A Displaced fracture of proximal phalanx of right thumb, initial encounter for closed fracture: Secondary | ICD-10-CM | POA: Insufficient documentation

## 2021-04-23 ENCOUNTER — Other Ambulatory Visit: Payer: Self-pay | Admitting: Internal Medicine

## 2021-04-24 DIAGNOSIS — Z79899 Other long term (current) drug therapy: Secondary | ICD-10-CM | POA: Diagnosis not present

## 2021-04-24 DIAGNOSIS — S62511A Displaced fracture of proximal phalanx of right thumb, initial encounter for closed fracture: Secondary | ICD-10-CM | POA: Diagnosis not present

## 2021-04-24 DIAGNOSIS — I10 Essential (primary) hypertension: Secondary | ICD-10-CM | POA: Diagnosis not present

## 2021-04-24 DIAGNOSIS — E785 Hyperlipidemia, unspecified: Secondary | ICD-10-CM | POA: Diagnosis not present

## 2021-04-24 DIAGNOSIS — G2581 Restless legs syndrome: Secondary | ICD-10-CM | POA: Diagnosis not present

## 2021-04-24 DIAGNOSIS — I2510 Atherosclerotic heart disease of native coronary artery without angina pectoris: Secondary | ICD-10-CM | POA: Diagnosis not present

## 2021-04-24 DIAGNOSIS — Z01818 Encounter for other preprocedural examination: Secondary | ICD-10-CM | POA: Diagnosis not present

## 2021-04-24 DIAGNOSIS — Z7982 Long term (current) use of aspirin: Secondary | ICD-10-CM | POA: Diagnosis not present

## 2021-04-24 DIAGNOSIS — R9431 Abnormal electrocardiogram [ECG] [EKG]: Secondary | ICD-10-CM | POA: Diagnosis not present

## 2021-04-28 DIAGNOSIS — E669 Obesity, unspecified: Secondary | ICD-10-CM | POA: Diagnosis not present

## 2021-04-28 DIAGNOSIS — I10 Essential (primary) hypertension: Secondary | ICD-10-CM | POA: Diagnosis not present

## 2021-04-28 DIAGNOSIS — Z6829 Body mass index (BMI) 29.0-29.9, adult: Secondary | ICD-10-CM | POA: Diagnosis not present

## 2021-04-28 DIAGNOSIS — G4730 Sleep apnea, unspecified: Secondary | ICD-10-CM | POA: Diagnosis not present

## 2021-04-28 DIAGNOSIS — G8918 Other acute postprocedural pain: Secondary | ICD-10-CM | POA: Diagnosis not present

## 2021-04-28 DIAGNOSIS — I2510 Atherosclerotic heart disease of native coronary artery without angina pectoris: Secondary | ICD-10-CM | POA: Diagnosis not present

## 2021-04-28 DIAGNOSIS — Z951 Presence of aortocoronary bypass graft: Secondary | ICD-10-CM | POA: Diagnosis not present

## 2021-04-28 DIAGNOSIS — Z7982 Long term (current) use of aspirin: Secondary | ICD-10-CM | POA: Diagnosis not present

## 2021-04-28 DIAGNOSIS — E785 Hyperlipidemia, unspecified: Secondary | ICD-10-CM | POA: Diagnosis not present

## 2021-04-28 DIAGNOSIS — M79644 Pain in right finger(s): Secondary | ICD-10-CM | POA: Diagnosis not present

## 2021-04-28 DIAGNOSIS — S62511A Displaced fracture of proximal phalanx of right thumb, initial encounter for closed fracture: Secondary | ICD-10-CM | POA: Diagnosis not present

## 2021-05-08 ENCOUNTER — Other Ambulatory Visit: Payer: Self-pay

## 2021-05-08 ENCOUNTER — Other Ambulatory Visit: Payer: Medicare PPO

## 2021-05-08 DIAGNOSIS — R972 Elevated prostate specific antigen [PSA]: Secondary | ICD-10-CM

## 2021-05-08 DIAGNOSIS — N138 Other obstructive and reflux uropathy: Secondary | ICD-10-CM | POA: Diagnosis not present

## 2021-05-08 DIAGNOSIS — N401 Enlarged prostate with lower urinary tract symptoms: Secondary | ICD-10-CM | POA: Diagnosis not present

## 2021-05-09 LAB — PSA: Prostate Specific Ag, Serum: 3.8 ng/mL (ref 0.0–4.0)

## 2021-05-11 ENCOUNTER — Ambulatory Visit: Payer: Self-pay | Admitting: Urology

## 2021-05-11 ENCOUNTER — Other Ambulatory Visit: Payer: Self-pay | Admitting: Internal Medicine

## 2021-05-13 ENCOUNTER — Other Ambulatory Visit: Payer: Self-pay

## 2021-05-13 ENCOUNTER — Encounter: Payer: Self-pay | Admitting: Urology

## 2021-05-13 ENCOUNTER — Ambulatory Visit (INDEPENDENT_AMBULATORY_CARE_PROVIDER_SITE_OTHER): Payer: Medicare PPO | Admitting: Urology

## 2021-05-13 VITALS — BP 118/71 | HR 83 | Ht 64.0 in | Wt 157.0 lb

## 2021-05-13 DIAGNOSIS — R972 Elevated prostate specific antigen [PSA]: Secondary | ICD-10-CM | POA: Diagnosis not present

## 2021-05-13 DIAGNOSIS — M79644 Pain in right finger(s): Secondary | ICD-10-CM | POA: Diagnosis not present

## 2021-05-13 DIAGNOSIS — N138 Other obstructive and reflux uropathy: Secondary | ICD-10-CM

## 2021-05-13 DIAGNOSIS — M25641 Stiffness of right hand, not elsewhere classified: Secondary | ICD-10-CM | POA: Diagnosis not present

## 2021-05-13 DIAGNOSIS — N401 Enlarged prostate with lower urinary tract symptoms: Secondary | ICD-10-CM | POA: Diagnosis not present

## 2021-05-13 DIAGNOSIS — N5201 Erectile dysfunction due to arterial insufficiency: Secondary | ICD-10-CM | POA: Diagnosis not present

## 2021-05-13 MED ORDER — SILDENAFIL CITRATE 100 MG PO TABS: ORAL_TABLET | ORAL | 1 refills | Status: DC

## 2021-05-13 MED ORDER — FINASTERIDE 5 MG PO TABS: ORAL_TABLET | ORAL | 3 refills | Status: DC

## 2021-05-13 NOTE — Progress Notes (Signed)
05/13/2021 3:06 PM   Bruce Gomez 10/26/1945 [id number]  Referring provider: Cletis Athens, MD [address] [place],  [place] [address]  Chief Complaint  Patient presents with   Elevated PSA    Urologic History:   1.  Elevated PSA-previously followed in [place].  Four prior prostate biopsies since the early 2000 with benign pathology and a baseline PSA between 11-13.   2.  BPH-urinary retention in 2014 and status post TURP by Dr. Robby Sermon with an incomplete resection.  He had resection of a large intravesical median lobe with 26 g resected.   3.  Erectile dysfunction.-On sildenafil  HPI: 76 y.o. male presents for annual follow-up.  Doing well since last visit No bothersome LUTS Denies dysuria, gross hematuria Denies flank, abdominal or pelvic pain Remains on finasteride PSA 05/08/2021 was 3.8 (uncorrected)   PMH: Past Medical History:  Diagnosis Date   Actinic keratosis    Aortic sclerosis 04/21/2017   Overview:  Moderate Aortic Sclerosis, mild LVH,  mild Mitral valve thickening, Slight LAE on ECHO - started  on ACE  for  LVH   Arthritis    hips and lower back   BPH with obstruction/lower urinary tract symptoms 03/19/2014   BPH with obstruction/lower urinary tract symptoms 03/19/2014   CAD (coronary artery disease) 04/21/2017   Overview:   silent MI - Inferior; s/p CABG x 3; Stress Echo  6/05; 8/08;  2/12   Chronic prostatitis 11/03/2000   Coronary artery disease    Elevated PSA 03/19/2014   Erectile dysfunction 03/19/2014   History of basal cell carcinoma 07/02/2015   right nose   History of dysplastic nevus 06/22/2017   left ant deltoid   Hyperlipidemia 04/21/2017   Hypertension    Impaired glucose tolerance 04/21/2017   Myocardial infarction ([place]) 2002   OSA (obstructive sleep apnea) 04/21/2017   Overview:  rx with wt loss, side sleeping-no CPAP, not true sleep apnea   RLS (restless legs syndrome) 04/21/2017    Surgical History: Past Surgical History:   Procedure Laterality Date   ADENOIDECTOMY     CARDIAC SURGERY     cabg 2002   COLONOSCOPY WITH PROPOFOL N/A 12/14/2019   Procedure: COLONOSCOPY WITH PROPOFOL;  Surgeon: Lucilla Lame, MD;  Location: [facility];  Service: Endoscopy;  Laterality: N/A;  priority 4   CORONARY ARTERY BYPASS GRAFT  2002   x3   INGUINAL HERNIA REPAIR     POLYPECTOMY  12/14/2019   Procedure: POLYPECTOMY;  Surgeon: Lucilla Lame, MD;  Location[facility];  Service: Endoscopy;;   TRANSURETHRAL RESECTION OF PROSTATE     2014    Home Medications:  Allergies as of 05/13/2021       Reactions   Pollen Extract    seasonal   Sulfa Antibiotics Other (See Comments)   Dizziness   Sulfamethoxazole-trimethoprim    dizzy dizzy   Benadryl [diphenhydramine Hcl (sleep)] Anxiety        Medication List        Accurate as of May 13, 2021  3:06 PM. If you have any questions, ask your nurse or doctor.          aspirin EC 81 MG tablet Take 81 mg by mouth.   atorvastatin 10 MG tablet Commonly known as: LIPITOR TAKE 1 TABLET EVERY DAY   B COMPLEX 1 PO Take by mouth. Takes 1 a week   calcium carbonate 1500 (600 Ca) MG Tabs tablet Commonly known as: OSCAL Take by mouth.   clonazePAM  0.5 MG tablet Commonly known as: KLONOPIN Take one tablet at bedtime   Coenzyme Q10 100 MG capsule Take 200 mg by mouth.   esomeprazole 40 MG capsule Commonly known as: NEXIUM Take 1 capsule (40 mg total) by mouth daily.   finasteride 5 MG tablet Commonly known as: PROSCAR TAKE 1 TABLET (5 MG TOTAL) DAILY.   folic acid 354 MCG tablet Commonly known as: FOLVITE Take by mouth.   lisinopril 10 MG tablet Commonly known as: ZESTRIL TAKE 1 TABLET EVERY DAY   loratadine 10 MG tablet Commonly known as: CLARITIN Take 10 mg by mouth daily as needed.   melatonin 3 MG Tabs tablet Take by mouth.   metoprolol succinate 25 MG 24 hr tablet Commonly known as: TOPROL-XL TAKE 1/2 TABLET TWICE DAILY    niacin 500 MG CR tablet Commonly known as: NIASPAN TAKE 4 TABLETS AT BEDTIME   Omega-3 1000 MG Caps Take by mouth.   pseudoephedrine 30 MG tablet Commonly known as: SUDAFED Take by mouth.   sildenafil 100 MG tablet Commonly known as: VIAGRA TAKE ONE TABLET BY MOUTH ONE HOUR PRIOR TO INTERCOURSE AS NEEDED   vitamin C 500 MG tablet Commonly known as: ASCORBIC ACID Take 500 mg by mouth.   Vitamin D3 50 MCG (2000 UT) capsule Take by mouth.        Allergies:  Allergies  Allergen Reactions   Pollen Extract     seasonal   Sulfa Antibiotics Other (See Comments)    Dizziness    Sulfamethoxazole-Trimethoprim     dizzy dizzy    Benadryl [Diphenhydramine Hcl (Sleep)] Anxiety    Family History: Family History  Problem Relation Age of Onset   Prostate cancer Paternal Uncle    Bladder Cancer Neg Hx    Kidney cancer Neg Hx     Social History:  reports that he has never smoked. He has never used smokeless tobacco. He reports current alcohol use of about 1.0 - 2.0 standard drink per week. He reports that he does not use drugs.   Physical Exam: BP 118/71    Pulse 83    Ht 5\' 4"  (1.626 m)    Wt 157 lb (71.2 kg)    BMI 26.95 kg/m   Constitutional:  Alert and oriented, No acute distress. HEENT: [place] AT, moist mucus membranes.  Trachea midline, no masses. Cardiovascular: No clubbing, cyanosis, or edema. Respiratory: Normal respiratory effort, no increased work of breathing. Psychiatric: Normal mood and affect.    Assessment & Plan:    1.  Elevated PSA Stable Follow-up 1 year with PSA  2.  BPH without LUTS Doing well since prior TURP Continue finasteride prophylactically-refilled  3.  Erectile dysfunction Sildenafil refilled   [name], MD  [facility] [address] [place], [place] [address] [phone]

## 2021-05-17 ENCOUNTER — Encounter: Payer: Self-pay | Admitting: Urology

## 2021-06-03 DIAGNOSIS — M25641 Stiffness of right hand, not elsewhere classified: Secondary | ICD-10-CM | POA: Diagnosis not present

## 2021-06-08 DIAGNOSIS — S62511A Displaced fracture of proximal phalanx of right thumb, initial encounter for closed fracture: Secondary | ICD-10-CM | POA: Diagnosis not present

## 2021-06-08 DIAGNOSIS — M25641 Stiffness of right hand, not elsewhere classified: Secondary | ICD-10-CM | POA: Diagnosis not present

## 2021-06-15 ENCOUNTER — Ambulatory Visit: Payer: Medicare PPO | Admitting: Internal Medicine

## 2021-06-16 DIAGNOSIS — M25641 Stiffness of right hand, not elsewhere classified: Secondary | ICD-10-CM | POA: Diagnosis not present

## 2021-06-22 DIAGNOSIS — M25641 Stiffness of right hand, not elsewhere classified: Secondary | ICD-10-CM | POA: Diagnosis not present

## 2021-06-22 DIAGNOSIS — S62511D Displaced fracture of proximal phalanx of right thumb, subsequent encounter for fracture with routine healing: Secondary | ICD-10-CM | POA: Diagnosis not present

## 2021-06-29 DIAGNOSIS — M25641 Stiffness of right hand, not elsewhere classified: Secondary | ICD-10-CM | POA: Diagnosis not present

## 2021-07-13 DIAGNOSIS — M25641 Stiffness of right hand, not elsewhere classified: Secondary | ICD-10-CM | POA: Diagnosis not present

## 2021-07-13 DIAGNOSIS — Z4789 Encounter for other orthopedic aftercare: Secondary | ICD-10-CM | POA: Diagnosis not present

## 2021-07-15 ENCOUNTER — Other Ambulatory Visit: Payer: Self-pay | Admitting: Internal Medicine

## 2021-07-15 DIAGNOSIS — F419 Anxiety disorder, unspecified: Secondary | ICD-10-CM

## 2021-07-20 DIAGNOSIS — M25641 Stiffness of right hand, not elsewhere classified: Secondary | ICD-10-CM | POA: Diagnosis not present

## 2021-07-20 DIAGNOSIS — Z4789 Encounter for other orthopedic aftercare: Secondary | ICD-10-CM | POA: Diagnosis not present

## 2021-08-10 DIAGNOSIS — Z4789 Encounter for other orthopedic aftercare: Secondary | ICD-10-CM | POA: Diagnosis not present

## 2021-08-12 ENCOUNTER — Other Ambulatory Visit: Payer: Self-pay | Admitting: Internal Medicine

## 2021-08-12 DIAGNOSIS — I10 Essential (primary) hypertension: Secondary | ICD-10-CM

## 2021-08-17 ENCOUNTER — Other Ambulatory Visit: Payer: Self-pay | Admitting: Internal Medicine

## 2021-08-17 DIAGNOSIS — F419 Anxiety disorder, unspecified: Secondary | ICD-10-CM

## 2021-08-24 DIAGNOSIS — S52591D Other fractures of lower end of right radius, subsequent encounter for closed fracture with routine healing: Secondary | ICD-10-CM | POA: Diagnosis not present

## 2021-08-24 DIAGNOSIS — S62511D Displaced fracture of proximal phalanx of right thumb, subsequent encounter for fracture with routine healing: Secondary | ICD-10-CM | POA: Diagnosis not present

## 2021-08-24 DIAGNOSIS — Z4789 Encounter for other orthopedic aftercare: Secondary | ICD-10-CM | POA: Diagnosis not present

## 2021-09-02 DIAGNOSIS — Z4789 Encounter for other orthopedic aftercare: Secondary | ICD-10-CM | POA: Diagnosis not present

## 2021-09-21 ENCOUNTER — Ambulatory Visit (INDEPENDENT_AMBULATORY_CARE_PROVIDER_SITE_OTHER): Payer: Medicare PPO | Admitting: Dermatology

## 2021-09-21 DIAGNOSIS — L578 Other skin changes due to chronic exposure to nonionizing radiation: Secondary | ICD-10-CM | POA: Diagnosis not present

## 2021-09-21 DIAGNOSIS — L570 Actinic keratosis: Secondary | ICD-10-CM | POA: Diagnosis not present

## 2021-09-21 DIAGNOSIS — L821 Other seborrheic keratosis: Secondary | ICD-10-CM | POA: Diagnosis not present

## 2021-09-21 DIAGNOSIS — L720 Epidermal cyst: Secondary | ICD-10-CM

## 2021-09-21 DIAGNOSIS — L814 Other melanin hyperpigmentation: Secondary | ICD-10-CM

## 2021-09-21 DIAGNOSIS — Z1283 Encounter for screening for malignant neoplasm of skin: Secondary | ICD-10-CM | POA: Diagnosis not present

## 2021-09-21 DIAGNOSIS — D229 Melanocytic nevi, unspecified: Secondary | ICD-10-CM

## 2021-09-21 DIAGNOSIS — D1800 Hemangioma unspecified site: Secondary | ICD-10-CM

## 2021-09-21 MED ORDER — FLUOROURACIL 5 % EX CREA: TOPICAL_CREAM | CUTANEOUS | 0 refills | Status: DC

## 2021-09-21 NOTE — Patient Instructions (Addendum)
5-Fluorouracil/Calcipotriene Patient Education   Actinic keratoses are the dry, red scaly spots on the skin caused by sun damage. A portion of these spots can turn into skin cancer with time, and treating them can help prevent development of skin cancer.   Treatment of these spots requires removal of the defective skin cells. There are various ways to remove actinic keratoses, including freezing with liquid nitrogen, treatment with creams, or treatment with a blue light procedure in the office.   5-fluorouracil cream is a topical cream used to treat actinic keratoses. It works by interfering with the growth of abnormal fast-growing skin cells, such as actinic keratoses. These cells peel off and are replaced by healthy ones.   5-fluorouracil/calcipotriene is a combination of the 5-fluorouracil cream with a vitamin D analog cream called calcipotriene. The calcipotriene alone does not treat actinic keratoses. However, when it is combined with 5-fluorouracil, it helps the 5-fluorouracil treat the actinic keratoses much faster so that the same results can be achieved with a much shorter treatment time.  INSTRUCTIONS FOR 5-FLUOROURACIL/CALCIPOTRIENE CREAM:   5-fluorouracil/calcipotriene cream typically only needs to be used for 4-7 days. A thin layer should be applied twice a day to the treatment areas recommended by your physician.   If your physician prescribed you separate tubes of 5-fluourouracil and calcipotriene, apply a thin layer of 5-fluorouracil followed by a thin layer of calcipotriene.   Avoid contact with your eyes, nostrils, and mouth. Do not use 5-fluorouracil/calcipotriene cream on infected or open wounds.   You will develop redness, irritation and some crusting at areas where you have pre-cancer damage/actinic keratoses. IF YOU DEVELOP PAIN, BLEEDING, OR SIGNIFICANT CRUSTING, STOP THE TREATMENT EARLY - you have already gotten a good response and the actinic keratoses should clear up  well.  Wash your hands after applying 5-fluorouracil 5% cream on your skin.   A moisturizer or sunscreen with a minimum SPF 30 should be applied each morning.   Once you have finished the treatment, you can apply a thin layer of Vaseline twice a day to irritated areas to soothe and calm the areas more quickly. If you experience significant discomfort, contact your physician.  For some patients it is necessary to repeat the treatment for best results.  SIDE EFFECTS: When using 5-fluorouracil/calcipotriene cream, you may have mild irritation, such as redness, dryness, swelling, or a mild burning sensation. This usually resolves within 2 weeks. The more actinic keratoses you have, the more redness and inflammation you can expect during treatment. Eye irritation has been reported rarely. If this occurs, please let us know.   If you have any trouble using this cream, please call the office. If you have any other questions about this information, please do not hesitate to ask me before you leave the office.   Start 5-fluorouracil/calcipotriene cream twice a day for 7 days to affected areas including the left scalp and left cheek. Prescription sent to [facility]. Patient provided with contact information for pharmacy and advised the pharmacy will mail the prescription to their home. Patient provided with handout reviewing treatment course and side effects and advised to call or message [place] on MyChart with any concerns.

## 2021-09-21 NOTE — Progress Notes (Unsigned)
Follow-Up Visit   Subjective  Bruce Gomez is a 76 y.o. male who presents for the following: UBSE (AK and ISK follow up - patient has noticed a lesion on his cheek and nose that he would like checked today). The patient presents for Upper Body Skin Exam (UBSE) for skin cancer screening and mole check.  The patient has spots, moles and lesions to be evaluated, some may be new or changing.  The following portions of the chart were reviewed this encounter and updated as appropriate:   Tobacco  Allergies  Meds  Problems  Med Hx  Surg Hx  Fam Hx     Review of Systems:  No other skin or systemic complaints except as noted in HPI or Assessment and Plan.  Objective  Well appearing patient in no apparent distress; mood and affect are within normal limits.  All skin waist up examined.  L scalp, L cheek (2) Erythematous thin papules/macules with gritty scale.    Assessment & Plan  AK (actinic keratosis) (2) L scalp, L cheek  Start Calcipotriene/5FU cream BID x 1 week L scalp and L cheek - since no liquid nitrogen available in the office today.  fluorouracil (EFUDEX) 5 % cream - L scalp, L cheek Apply to the left scalp and left cheek BID x 1 week.  Skin cancer screening  Actinic skin damage  Lentigines - Scattered tan macules - Due to sun exposure - Benign-appearing, observe - Recommend daily broad spectrum sunscreen SPF 30+ to sun-exposed areas, reapply every 2 hours as needed. - Call for any changes  Seborrheic Keratoses - Stuck-on, waxy, tan-brown papules and/or plaques  - Benign-appearing - Discussed benign etiology and prognosis. - Observe - Call for any changes  Melanocytic Nevi - Tan-brown and/or pink-flesh-colored symmetric macules and papules - Benign appearing on exam today - Observation - Call clinic for new or changing moles - Recommend daily use of broad spectrum spf 30+ sunscreen to sun-exposed areas.   Hemangiomas - Red papules - Discussed benign  nature - Observe - Call for any changes  Actinic Damage - Severe, confluent actinic changes with pre-cancerous actinic keratoses  - Severe, chronic, not at goal, secondary to cumulative UV radiation exposure over time - diffuse scaly erythematous macules and papules with underlying dyspigmentation - Discussed Prescription "Field Treatment" for Severe, Chronic Confluent Actinic Changes with Pre-Cancerous Actinic Keratoses Field treatment involves treatment of an entire area of skin that has confluent Actinic Changes (Sun/ Ultraviolet light damage) and PreCancerous Actinic Keratoses by method of PhotoDynamic Therapy (PDT) and/or prescription Topical Chemotherapy agents such as 5-fluorouracil, 5-fluorouracil/calcipotriene, and/or imiquimod.  The purpose is to decrease the number of clinically evident and subclinical PreCancerous lesions to prevent progression to development of skin cancer by chemically destroying early precancer changes that may or may not be visible.  It has been shown to reduce the risk of developing skin cancer in the treated area. As a result of treatment, redness, scaling, crusting, and open sores may occur during treatment course. One or more than one of these methods may be used and may have to be used several times to control, suppress and eliminate the PreCancerous changes. Discussed treatment course, expected reaction, and possible side effects. - Recommend daily broad spectrum sunscreen SPF 30+ to sun-exposed areas, reapply every 2 hours as needed.  - Staying in the shade or wearing long sleeves, sun glasses (UVA+UVB protection) and wide brim hats (4-inch brim around the entire circumference of the hat) are also recommended. -  Call for new or changing lesions.  [place] - tiny firm white papules - type of cyst - benign - may be extracted if symptomatic - observe  Skin cancer screening performed today.  Return in about 7 months (around 04/23/2022) for AK follow up .  Luther Redo, CMA, am acting as scribe for Sarina Ser, MD . Documentation: I have reviewed the above documentation for accuracy and completeness, and I agree with the above.  Sarina Ser, MD

## 2021-09-23 ENCOUNTER — Encounter: Payer: Self-pay | Admitting: Dermatology

## 2021-10-19 ENCOUNTER — Other Ambulatory Visit: Payer: Self-pay | Admitting: Internal Medicine

## 2021-10-19 DIAGNOSIS — F419 Anxiety disorder, unspecified: Secondary | ICD-10-CM

## 2021-11-08 ENCOUNTER — Other Ambulatory Visit: Payer: Self-pay | Admitting: Internal Medicine

## 2021-11-08 DIAGNOSIS — K2100 Gastro-esophageal reflux disease with esophagitis, without bleeding: Secondary | ICD-10-CM

## 2021-11-23 DIAGNOSIS — M1811 Unilateral primary osteoarthritis of first carpometacarpal joint, right hand: Secondary | ICD-10-CM | POA: Diagnosis not present

## 2021-11-23 DIAGNOSIS — S62511D Displaced fracture of proximal phalanx of right thumb, subsequent encounter for fracture with routine healing: Secondary | ICD-10-CM | POA: Diagnosis not present

## 2021-12-02 ENCOUNTER — Other Ambulatory Visit: Payer: Self-pay | Admitting: Internal Medicine

## 2021-12-02 DIAGNOSIS — F419 Anxiety disorder, unspecified: Secondary | ICD-10-CM

## 2021-12-10 ENCOUNTER — Other Ambulatory Visit: Payer: Self-pay | Admitting: Internal Medicine

## 2021-12-15 ENCOUNTER — Other Ambulatory Visit: Payer: Self-pay | Admitting: Internal Medicine

## 2021-12-15 DIAGNOSIS — F419 Anxiety disorder, unspecified: Secondary | ICD-10-CM

## 2022-02-05 ENCOUNTER — Other Ambulatory Visit: Payer: Self-pay | Admitting: *Deleted

## 2022-02-05 DIAGNOSIS — Z0000 Encounter for general adult medical examination without abnormal findings: Secondary | ICD-10-CM

## 2022-02-08 ENCOUNTER — Other Ambulatory Visit: Payer: Self-pay

## 2022-02-08 DIAGNOSIS — F419 Anxiety disorder, unspecified: Secondary | ICD-10-CM

## 2022-02-08 MED ORDER — CLONAZEPAM 0.5 MG PO TABS: 0.5000 mg | ORAL_TABLET | Freq: Every day | ORAL | 0 refills | Status: DC

## 2022-02-12 ENCOUNTER — Telehealth: Payer: Self-pay | Admitting: Internal Medicine

## 2022-02-12 NOTE — Telephone Encounter (Signed)
R/S AWV ON 03/02/2022. If patient calls office, please transfer call to me jabber [phone]. Thank you.

## 2022-02-24 ENCOUNTER — Ambulatory Visit: Payer: Medicare PPO

## 2022-02-24 DIAGNOSIS — E782 Mixed hyperlipidemia: Secondary | ICD-10-CM

## 2022-02-24 DIAGNOSIS — Z0000 Encounter for general adult medical examination without abnormal findings: Secondary | ICD-10-CM | POA: Diagnosis not present

## 2022-02-24 DIAGNOSIS — I10 Essential (primary) hypertension: Secondary | ICD-10-CM | POA: Diagnosis not present

## 2022-02-24 NOTE — Progress Notes (Signed)
Labs drawn according to order

## 2022-02-25 LAB — COMPLETE METABOLIC PANEL WITH GFR
AG Ratio: 1.5 (calc) (ref 1.0–2.5)
ALT: 18 U/L (ref 9–46)
AST: 18 U/L (ref 10–35)
Albumin: 4 g/dL (ref 3.6–5.1)
Alkaline phosphatase (APISO): 73 U/L (ref 35–144)
BUN: 18 mg/dL (ref 7–25)
CO2: 26 mmol/L (ref 20–32)
Calcium: 9.4 mg/dL (ref 8.6–10.3)
Chloride: 105 mmol/L (ref 98–110)
Creat: 1.04 mg/dL (ref 0.70–1.28)
Globulin: 2.6 g/dL (calc) (ref 1.9–3.7)
Glucose, Bld: 103 mg/dL — ABNORMAL HIGH (ref 65–99)
Potassium: 4.4 mmol/L (ref 3.5–5.3)
Sodium: 139 mmol/L (ref 135–146)
Total Bilirubin: 0.4 mg/dL (ref 0.2–1.2)
Total Protein: 6.6 g/dL (ref 6.1–8.1)
eGFR: 74 mL/min/{1.73_m2} (ref >=60)

## 2022-02-25 LAB — CBC WITH DIFFERENTIAL/PLATELET
Absolute Monocytes: 1100 cells/uL — ABNORMAL HIGH (ref 200–950)
Basophils Absolute: 40 cells/uL (ref 0–200)
Basophils Relative: 0.4 %
Eosinophils Absolute: 320 cells/uL (ref 15–500)
Eosinophils Relative: 3.2 %
HCT: 32.9 % — ABNORMAL LOW (ref 38.5–50.0)
Hemoglobin: 10.6 g/dL — ABNORMAL LOW (ref 13.2–17.1)
Lymphs Abs: 1380 cells/uL (ref 850–3900)
MCH: 27.8 pg (ref 27.0–33.0)
MCHC: 32.2 g/dL (ref 32.0–36.0)
MCV: 86.4 fL (ref 80.0–100.0)
MPV: 12.7 fL — ABNORMAL HIGH (ref 7.5–12.5)
Monocytes Relative: 11 %
Neutro Abs: 7160 cells/uL (ref 1500–7800)
Neutrophils Relative %: 71.6 %
Platelets: 223 10*3/uL (ref 140–400)
RBC: 3.81 10*6/uL — ABNORMAL LOW (ref 4.20–5.80)
RDW: 13.8 % (ref 11.0–15.0)
Total Lymphocyte: 13.8 %
WBC: 10 10*3/uL (ref 3.8–10.8)

## 2022-02-25 LAB — LIPID PANEL
Cholesterol: 145 mg/dL (ref <200)
HDL: 74 mg/dL (ref >=40)
LDL Cholesterol (Calc): 54 mg/dL (calc)
Non-HDL Cholesterol (Calc): 71 mg/dL (calc) (ref <130)
Total CHOL/HDL Ratio: 2 (calc) (ref <5.0)
Triglycerides: 89 mg/dL (ref <150)

## 2022-02-25 LAB — TSH: TSH: 1.26 mIU/L (ref 0.40–4.50)

## 2022-03-02 ENCOUNTER — Encounter: Payer: Self-pay | Admitting: Internal Medicine

## 2022-03-02 ENCOUNTER — Ambulatory Visit (INDEPENDENT_AMBULATORY_CARE_PROVIDER_SITE_OTHER): Payer: Medicare PPO | Admitting: Internal Medicine

## 2022-03-02 VITALS — BP 130/59 | HR 73 | Temp 97.4°F | Ht 62.75 in | Wt 168.6 lb

## 2022-03-02 DIAGNOSIS — Z0000 Encounter for general adult medical examination without abnormal findings: Secondary | ICD-10-CM | POA: Diagnosis not present

## 2022-03-02 DIAGNOSIS — G4733 Obstructive sleep apnea (adult) (pediatric): Secondary | ICD-10-CM

## 2022-03-02 DIAGNOSIS — M51369 Other intervertebral disc degeneration, lumbar region without mention of lumbar back pain or lower extremity pain: Secondary | ICD-10-CM

## 2022-03-02 DIAGNOSIS — I10 Essential (primary) hypertension: Secondary | ICD-10-CM | POA: Diagnosis not present

## 2022-03-02 DIAGNOSIS — D649 Anemia, unspecified: Secondary | ICD-10-CM | POA: Diagnosis not present

## 2022-03-02 DIAGNOSIS — G2581 Restless legs syndrome: Secondary | ICD-10-CM

## 2022-03-02 DIAGNOSIS — M5136 Other intervertebral disc degeneration, lumbar region: Secondary | ICD-10-CM | POA: Diagnosis not present

## 2022-03-02 NOTE — Assessment & Plan Note (Signed)
Chronic problem. 

## 2022-03-02 NOTE — Progress Notes (Signed)
Established Patient Office Visit  Subjective:  Patient ID: Audon Heymann, male    DOB: 1945-06-26  Age: 76 y.o. MRN: [id number]  CC:  Chief Complaint  Patient presents with   Medicare Wellness    HPI  Quillan Whitter presents for general check up  Past Medical History:  Diagnosis Date   Actinic keratosis    Aortic sclerosis 04/21/2017   Overview:  Moderate Aortic Sclerosis, mild LVH,  mild Mitral valve thickening, Slight LAE on ECHO - started  on ACE  for  LVH   Arthritis    hips and lower back   BPH with obstruction/lower urinary tract symptoms 03/19/2014   BPH with obstruction/lower urinary tract symptoms 03/19/2014   CAD (coronary artery disease) 04/21/2017   Overview:   silent MI - Inferior; s/p CABG x 3; Stress Echo  6/05; 8/08;  2/12   Chronic prostatitis 11/03/2000   Coronary artery disease    Elevated PSA 03/19/2014   Erectile dysfunction 03/19/2014   History of basal cell carcinoma 07/02/2015   right nose   History of dysplastic nevus 06/22/2017   left ant deltoid   Hyperlipidemia 04/21/2017   Hypertension    Impaired glucose tolerance 04/21/2017   Myocardial infarction ([place]) 2002   OSA (obstructive sleep apnea) 04/21/2017   Overview:  rx with wt loss, side sleeping-no CPAP, not true sleep apnea   RLS (restless legs syndrome) 04/21/2017    Past Surgical History:  Procedure Laterality Date   ADENOIDECTOMY     CARDIAC SURGERY     cabg 2002   COLONOSCOPY WITH PROPOFOL N/A 12/14/2019   Procedure: COLONOSCOPY WITH PROPOFOL;  Surgeon: Lucilla Lame, MD;  Location: [facility];  Service: Endoscopy;  Laterality: N/A;  priority 4   CORONARY ARTERY BYPASS GRAFT  2002   x3   INGUINAL HERNIA REPAIR     POLYPECTOMY  12/14/2019   Procedure: POLYPECTOMY;  Surgeon: Lucilla Lame, MD;  Location: [facility];  Service: Endoscopy;;   TRANSURETHRAL RESECTION OF PROSTATE     2014    Family History  Problem Relation Age of Onset   Prostate cancer Paternal Uncle     Bladder Cancer Neg Hx    Kidney cancer Neg Hx     Social History   Socioeconomic History   Marital status: Married    Spouse name: Not on file   Number of children: Not on file   Years of education: Not on file   Highest education level: Not on file  Occupational History   Not on file  Tobacco Use   Smoking status: Never   Smokeless tobacco: Never  Vaping Use   Vaping Use: Never used  Substance and Sexual Activity   Alcohol use: Yes    Alcohol/week: 1.0 - 2.0 standard drink of alcohol    Types: 1 - 2 Glasses of wine per week    Comment: ocassional glass of wine   Drug use: No   Sexual activity: Yes    Birth control/protection: None  Other Topics Concern   Not on file  Social History Narrative   Not on file   Social Determinants of Health   Financial Resource Strain: Not on file  Food Insecurity: Not on file  Transportation Needs: Not on file  Physical Activity: Not on file  Stress: Not on file  Social Connections: Not on file  Intimate Partner Violence: Not on file     Current Outpatient Medications:    aspirin EC 81 MG tablet, Take  81 mg by mouth., Disp: , Rfl:    atorvastatin (LIPITOR) 10 MG tablet, TAKE 1 TABLET EVERY DAY, Disp: 90 tablet, Rfl: 3   B Complex Vitamins (B COMPLEX 1 PO), Take by mouth. Takes 1 a week, Disp: , Rfl:    calcium carbonate (OSCAL) 1500 (600 Ca) MG TABS tablet, Take by mouth., Disp: , Rfl:    Cholecalciferol (VITAMIN D3) 2000 units capsule, Take by mouth., Disp: , Rfl:    clonazePAM (KLONOPIN) 0.5 MG tablet, Take 1 tablet (0.5 mg total) by mouth at bedtime., Disp: 30 tablet, Rfl: 0   Coenzyme Q10 100 MG capsule, Take 200 mg by mouth., Disp: , Rfl:    esomeprazole (NEXIUM) 40 MG capsule, TAKE 1 CAPSULE EVERY DAY, Disp: 90 capsule, Rfl: 2   finasteride (PROSCAR) 5 MG tablet, TAKE 1 TABLET (5 MG TOTAL) DAILY., Disp: 90 tablet, Rfl: 3   fluorouracil (EFUDEX) 5 % cream, Apply to the left scalp and left cheek BID x 1 week., Disp: 15 g,  Rfl: 0   folic acid (FOLVITE) 627 MCG tablet, Take by mouth., Disp: , Rfl:    lisinopril (ZESTRIL) 10 MG tablet, TAKE 1 TABLET EVERY DAY, Disp: 90 tablet, Rfl: 3   loratadine (CLARITIN) 10 MG tablet, Take 10 mg by mouth daily as needed. , Disp: , Rfl:    Melatonin 3 MG TABS, Take by mouth., Disp: , Rfl:    metoprolol succinate (TOPROL-XL) 25 MG 24 hr tablet, TAKE 1/2 TABLET TWICE DAILY, Disp: 90 tablet, Rfl: 3   niacin (NIASPAN) 500 MG CR tablet, TAKE 4 TABLETS AT BEDTIME, Disp: 360 tablet, Rfl: 3   Omega-3 1000 MG CAPS, Take by mouth., Disp: , Rfl:    pseudoephedrine (SUDAFED) 30 MG tablet, Take by mouth., Disp: , Rfl:    sildenafil (VIAGRA) 100 MG tablet, TAKE ONE TABLET BY MOUTH ONE HOUR PRIOR TO INTERCOURSE AS NEEDED, Disp: 90 tablet, Rfl: 1   vitamin C (ASCORBIC ACID) 500 MG tablet, Take 500 mg by mouth., Disp: , Rfl:    Allergies  Allergen Reactions   Pollen Extract     seasonal   Sulfa Antibiotics Other (See Comments)    Dizziness    Sulfamethoxazole-Trimethoprim     dizzy dizzy    Benadryl [Diphenhydramine Hcl (Sleep)] Anxiety    ROS Review of Systems  Constitutional: Negative.   HENT: Negative.    Eyes: Negative.   Respiratory: Negative.    Cardiovascular: Negative.   Gastrointestinal: Negative.   Endocrine: Negative.   Genitourinary: Negative.   Musculoskeletal: Negative.   Skin: Negative.   Allergic/Immunologic: Negative.   Neurological: Negative.   Hematological: Negative.   Psychiatric/Behavioral: Negative.    All other systems reviewed and are negative.     Objective:    Physical Exam Vitals reviewed.  Constitutional:      Appearance: Normal appearance.  HENT:     Mouth/Throat:     Mouth: Mucous membranes are moist.  Eyes:     Pupils: Pupils are equal, round, and reactive to light.  Neck:     Vascular: No carotid bruit.  Cardiovascular:     Rate and Rhythm: Normal rate and regular rhythm.     Pulses: Normal pulses.     Heart sounds: Normal  heart sounds.  Pulmonary:     Effort: Pulmonary effort is normal.     Breath sounds: Normal breath sounds.  Abdominal:     General: Bowel sounds are normal.     Palpations: Abdomen is soft.  There is no hepatomegaly, splenomegaly or mass.     Tenderness: There is no abdominal tenderness.     Hernia: No hernia is present.  Musculoskeletal:     Cervical back: Neck supple.     Right lower leg: No edema.     Left lower leg: No edema.  Skin:    Findings: No rash.  Neurological:     Mental Status: He is alert and oriented to person, place, and time.     Motor: No weakness.  Psychiatric:        Mood and Affect: Mood normal.        Behavior: Behavior normal.     BP (!) 130/59   Pulse 73   Temp (!) 97.4 F (36.3 C) (Temporal)   Ht 5' 2.75" (1.594 m)   Wt 168 lb 9.6 oz (76.5 kg)   SpO2 97%   BMI 30.10 kg/m  Wt Readings from Last 3 Encounters:  03/02/22 168 lb 9.6 oz (76.5 kg)  05/13/21 157 lb (71.2 kg)  02/16/21 162 lb 14.4 oz (73.9 kg)     Health Maintenance Due  Topic Date Due   Medicare Annual Wellness (AWV)  02/07/2020    There are no preventive care reminders to display for this patient.  Lab Results  Component Value Date   TSH 1.26 02/24/2022   Lab Results  Component Value Date   WBC 10.0 02/24/2022   HGB 10.6 (L) 02/24/2022   HCT 32.9 (L) 02/24/2022   MCV 86.4 02/24/2022   PLT 223 02/24/2022   Lab Results  Component Value Date   NA 139 02/24/2022   K 4.4 02/24/2022   CO2 26 02/24/2022   GLUCOSE 103 (H) 02/24/2022   BUN 18 02/24/2022   CREATININE 1.04 02/24/2022   BILITOT 0.4 02/24/2022   ALKPHOS 85 10/13/2017   AST 18 02/24/2022   ALT 18 02/24/2022   PROT 6.6 02/24/2022   ALBUMIN 4.3 10/13/2017   CALCIUM 9.4 02/24/2022   ANIONGAP 9 01/15/2016   EGFR 74 02/24/2022   Lab Results  Component Value Date   CHOL 145 02/24/2022   Lab Results  Component Value Date   HDL 74 02/24/2022   Lab Results  Component Value Date   LDLCALC 54 02/24/2022    Lab Results  Component Value Date   TRIG 89 02/24/2022   Lab Results  Component Value Date   CHOLHDL 2.0 02/24/2022   No results found for: "HGBA1C"    Assessment & Plan:   Problem List Items Addressed This Visit       Cardiovascular and Mediastinum   Essential hypertension - Primary     Patient denies any chest pain or shortness of breath there is no history of palpitation or paroxysmal nocturnal dyspnea   patient was advised to follow low-salt low-cholesterol diet    ideally I want to keep systolic blood pressure below 130 mmHg, patient was asked to check blood pressure one times a week and give me a report on that.  Patient will be follow-up in 3 months  or earlier as needed, patient will call me back for any change in the cardiovascular symptoms Patient was advised to buy a book from local bookstore concerning blood pressure and read several chapters  every day.  This will be supplemented by some of the material we will give him from the office.  Patient should also utilize other resources like YouTube and Internet to learn more about the blood pressure and the diet.  Respiratory   OSA (obstructive sleep apnea)    Patient is not using his CPAP machine        Musculoskeletal and Integument   DDD (degenerative disc disease), lumbar    - Patient's back pain is under control with medication.  - Encouraged the patient to stretch or do yoga as able to help with back pain, patient has seen the urologist last year        Other   RLS (restless legs syndrome)    Chronic problem      Annual physical exam   Other Visit Diagnoses     Anemia, unspecified type       Relevant Orders   Ambulatory referral to Hematology / Oncology       No orders of the defined types were placed in this encounter.   Follow-up: No follow-ups on file.    Cletis Athens, MD

## 2022-03-02 NOTE — Patient Instructions (Signed)
  Mr. Bruce Gomez , Thank you for taking time to come for your Medicare Wellness Visit. I appreciate your ongoing commitment to your health goals. Please review the following plan we discussed and let me know if I can assist you in the future.   These are the goals we discussed:  Goals   None     This is a list of the screening recommended for you and due dates:  Health Maintenance  Topic Date Due   Medicare Annual Wellness Visit  02/07/2020   COVID-19 Vaccine (4 - 2023-24 season) 03/18/2022*   Pneumonia Vaccine  Completed   Flu Shot  Completed   Hepatitis C Screening: USPSTF Recommendation to screen - Ages 18-79 yo.  Completed   Zoster (Shingles) Vaccine  Completed   HPV Vaccine  Aged Out   Colon Cancer Screening  Discontinued  *Topic was postponed. The date shown is not the original due date.

## 2022-03-02 NOTE — Assessment & Plan Note (Signed)

## 2022-03-02 NOTE — Assessment & Plan Note (Signed)
-   Patient's back pain is under control with medication.  - Encouraged the patient to stretch or do yoga as able to help with back pain, patient has seen the urologist last year

## 2022-03-02 NOTE — Assessment & Plan Note (Signed)
Patient is not using his CPAP machine

## 2022-03-15 ENCOUNTER — Ambulatory Visit (INDEPENDENT_AMBULATORY_CARE_PROVIDER_SITE_OTHER): Payer: Medicare PPO

## 2022-03-15 VITALS — Ht 64.0 in | Wt 168.0 lb

## 2022-03-15 DIAGNOSIS — Z0000 Encounter for general adult medical examination without abnormal findings: Secondary | ICD-10-CM | POA: Diagnosis not present

## 2022-03-15 NOTE — Progress Notes (Signed)
Virtual Visit via Telephone Note  I connected with  Bruce Gomez on 03/15/22 at 11:00 AM EST by telephone and verified that I am speaking with the correct person using two identifiers.  Location: Patient: home Provider: [facility] Persons participating in the virtual visit: patient/Nurse Health Advisor   I discussed the limitations, risks, security and privacy concerns of performing an evaluation and management service by telephone and the availability of in person appointments. The patient expressed understanding and agreed to proceed.  Interactive audio and video telecommunications were attempted between this nurse and patient, however failed, due to patient having technical difficulties OR patient did not have access to video capability.  We continued and completed visit with audio only.  Some vital signs may be absent or patient reported.   Dionisio David, LPN  Subjective:   Bruce Gomez is a 76 y.o. male who presents for Medicare Annual/Subsequent preventive examination.  Review of Systems           Objective:    Today's Vitals   03/15/22 1109  PainSc: 3    There is no height or weight on file to calculate BMI.     03/02/2022    2:11 PM 12/14/2019    8:51 AM 01/15/2016    1:56 PM  Advanced Directives  Does Patient Have a Medical Advance Directive? Yes Yes No  Type of Paramedic of [place];Living will [place];Living will   Does patient want to make changes to medical advance directive? No - Patient declined No - Patient declined   Copy of [place] in Chart? No - copy requested No - copy requested   Would patient like information on creating a medical advance directive?   Yes - Educational materials given    Current Medications (verified) Outpatient Encounter Medications as of 03/15/2022  Medication Sig   aspirin EC 81 MG tablet Take 81 mg by mouth.   atorvastatin (LIPITOR) 10 MG tablet TAKE 1  TABLET EVERY DAY   B Complex Vitamins (B COMPLEX 1 PO) Take by mouth. Takes 1 a week   calcium carbonate (OSCAL) 1500 (600 Ca) MG TABS tablet Take by mouth.   Cholecalciferol (VITAMIN D3) 2000 units capsule Take by mouth.   clonazePAM (KLONOPIN) 0.5 MG tablet Take 1 tablet (0.5 mg total) by mouth at bedtime.   Coenzyme Q10 100 MG capsule Take 200 mg by mouth.   esomeprazole (NEXIUM) 40 MG capsule TAKE 1 CAPSULE EVERY DAY   finasteride (PROSCAR) 5 MG tablet TAKE 1 TABLET (5 MG TOTAL) DAILY.   fluorouracil (EFUDEX) 5 % cream Apply to the left scalp and left cheek BID x 1 week.   folic acid (FOLVITE) 283 MCG tablet Take by mouth.   lisinopril (ZESTRIL) 10 MG tablet TAKE 1 TABLET EVERY DAY   loratadine (CLARITIN) 10 MG tablet Take 10 mg by mouth daily as needed.    Melatonin 3 MG TABS Take by mouth.   metoprolol succinate (TOPROL-XL) 25 MG 24 hr tablet TAKE 1/2 TABLET TWICE DAILY   niacin (NIASPAN) 500 MG CR tablet TAKE 4 TABLETS AT BEDTIME   Omega-3 1000 MG CAPS Take by mouth.   pseudoephedrine (SUDAFED) 30 MG tablet Take by mouth.   sildenafil (VIAGRA) 100 MG tablet TAKE ONE TABLET BY MOUTH ONE HOUR PRIOR TO INTERCOURSE AS NEEDED   vitamin C (ASCORBIC ACID) 500 MG tablet Take 500 mg by mouth.   No facility-administered encounter medications on file as of 03/15/2022.  Allergies (verified) Pollen extract, Sulfa antibiotics, Sulfamethoxazole-trimethoprim, and Benadryl [diphenhydramine hcl (sleep)]   History: Past Medical History:  Diagnosis Date   Actinic keratosis    Aortic sclerosis 04/21/2017   Overview:  Moderate Aortic Sclerosis, mild LVH,  mild Mitral valve thickening, Slight LAE on ECHO - started  on ACE  for  LVH   Arthritis    hips and lower back   BPH with obstruction/lower urinary tract symptoms 03/19/2014   BPH with obstruction/lower urinary tract symptoms 03/19/2014   CAD (coronary artery disease) 04/21/2017   Overview:   silent MI - Inferior; s/p CABG x 3; Stress Echo   6/05; 8/08;  2/12   Chronic prostatitis 11/03/2000   Coronary artery disease    Elevated PSA 03/19/2014   Erectile dysfunction 03/19/2014   History of basal cell carcinoma 07/02/2015   right nose   History of dysplastic nevus 06/22/2017   left ant deltoid   Hyperlipidemia 04/21/2017   Hypertension    Impaired glucose tolerance 04/21/2017   Myocardial infarction ([place]) 2002   OSA (obstructive sleep apnea) 04/21/2017   Overview:  rx with wt loss, side sleeping-no CPAP, not true sleep apnea   RLS (restless legs syndrome) 04/21/2017   Past Surgical History:  Procedure Laterality Date   ADENOIDECTOMY     CARDIAC SURGERY     cabg 2002   COLONOSCOPY WITH PROPOFOL N/A 12/14/2019   Procedure: COLONOSCOPY WITH PROPOFOL;  Surgeon: Lucilla Lame, MD;  Location: [place];  Service: Endoscopy;  Laterality: N/A;  priority 4   CORONARY ARTERY BYPASS GRAFT  2002   x3   INGUINAL HERNIA REPAIR     POLYPECTOMY  12/14/2019   Procedure: POLYPECTOMY;  Surgeon: Lucilla Lame, MD;  Location: [place];  Service: Endoscopy;;   TRANSURETHRAL RESECTION OF PROSTATE     2014   Family History  Problem Relation Age of Onset   Prostate cancer Paternal Uncle    Bladder Cancer Neg Hx    Kidney cancer Neg Hx    Social History   Socioeconomic History   Marital status: Married    Spouse name: Not on file   Number of children: Not on file   Years of education: Not on file   Highest education level: Not on file  Occupational History   Not on file  Tobacco Use   Smoking status: Never   Smokeless tobacco: Never  Vaping Use   Vaping Use: Never used  Substance and Sexual Activity   Alcohol use: Yes    Alcohol/week: 1.0 - 2.0 standard drink of alcohol    Types: 1 - 2 Glasses of wine per week    Comment: ocassional glass of wine   Drug use: No   Sexual activity: Yes    Birth control/protection: None  Other Topics Concern   Not on file  Social History Narrative   Not on file   Social  Determinants of Health   Financial Resource Strain: Not on file  Food Insecurity: Not on file  Transportation Needs: Not on file  Physical Activity: Not on file  Stress: Not on file  Social Connections: Not on file    Tobacco Counseling Counseling given: Not Answered   Clinical Intake:  Pre-visit preparation completed: Yes  Pain : 0-10 Pain Score: 3  Pain Location: Hip Pain Orientation: Right     Diabetes: No  How often do you need to have someone help you when you read instructions, pamphlets, or other written materials from your  doctor or pharmacy?: 1 - Never  Diabetic?no  Interpreter Needed?: No  Information entered by :: Kirke Shaggy, LPN   Activities of Daily Living    03/02/2022    2:12 PM  In your present state of health, do you have any difficulty performing the following activities:  Hearing? 0  Vision? 0  Difficulty concentrating or making decisions? 0  Walking or climbing stairs? 0  Dressing or bathing? 0  Doing errands, shopping? 0  Preparing Food and eating ? N  Using the Toilet? N  In the past six months, have you accidently leaked urine? N  Do you have problems with loss of bowel control? N  Managing your Medications? N  Managing your Finances? N  Housekeeping or managing your Housekeeping? N    Patient Care Team: Cletis Athens, MD as PCP - General (Internal Medicine)  Indicate any recent Medical Services you may have received from other than [facility] providers in the past year (date may be approximate).     Assessment:   This is a routine wellness examination for [place].  Hearing/Vision screen No results found.  Dietary issues and exercise activities discussed:     Goals Addressed   None    Depression Screen    03/02/2022    2:12 PM 02/16/2021    9:02 AM 02/05/2020    9:00 AM  PHQ 2/9 Scores  PHQ - 2 Score 0 0 0    Fall Risk    03/02/2022    2:12 PM 02/16/2021    9:01 AM 02/05/2020    9:00 AM 02/28/2019   11:14 AM  11/07/2017    9:12 AM  Fall Risk   Falls in the past year? 0 0 0 0 No  Comment    [employer] Telephone Survey: data to providers prior to load [employer] Survey: data to providers prior to load  Number falls in past yr: 0 0 0    Injury with Fall? 0 0 0    Risk for fall due to : No Fall Risks No Fall Risks     Follow up  Falls evaluation completed       [place]:  Any stairs in or around the home? Yes  If so, are there any without handrails? No  Home free of loose throw rugs in walkways, pet beds, electrical cords, etc? Yes  Adequate lighting in your home to reduce risk of falls? Yes   ASSISTIVE DEVICES UTILIZED TO PREVENT FALLS:  Life alert? No  Use of a cane, walker or w/c? No  Grab bars in the bathroom? Yes  Shower chair or bench in shower? No  Elevated toilet seat or a handicapped toilet? Yes    Cognitive Function:        03/02/2022    2:14 PM  6CIT Screen  What Year? 0 points  What month? 0 points  What time? 0 points  Count back from 20 0 points  Months in reverse 0 points  Repeat phrase 0 points  Total Score 0 points    Immunizations Immunization History  Administered Date(s) Administered   Covid-19, Mrna,Vaccine(Spikevax)29yr and older 12/30/2021   Influenza Split 01/25/2001, 02/05/2011, 01/15/2012   Influenza, High Dose Seasonal PF 01/06/2017, 01/23/2018   Influenza-Unspecified 01/25/2001, 02/05/2011, 01/15/2012, 01/03/2018, 12/12/2019, 01/15/2021, 12/30/2021   Moderna SARS-COV2 Booster Vaccination 01/29/2021   Moderna Sars-Covid-2 Vaccination 04/30/2019, 05/29/2019, 01/31/2020, 01/29/2021   PNEUMOCOCCAL CONJUGATE-20 02/16/2021   Pneumococcal-Unspecified 04/20/2010   Td 02/01/2005  Tdap 12/20/2010, 03/24/2021   Zoster Recombinat (Shingrix) 06/10/2017, 09/02/2017   Zoster, Live 07/19/2005    TDAP status: Up to date  Flu Vaccine status: Up to date  Pneumococcal vaccine status: Up to date  Covid-19 vaccine status:  Completed vaccines  Qualifies for Shingles Vaccine? Yes   Zostavax completed Yes   Shingrix Completed?: Yes  Screening Tests Health Maintenance  Topic Date Due   COVID-19 Vaccine (5 - 2023-24 season) 03/18/2022 (Originally 02/24/2022)   Medicare Annual Wellness (AWV)  03/16/2023   DTaP/Tdap/Td (4 - Td or Tdap) 03/25/2031   Pneumonia Vaccine 80+ Years old  Completed   INFLUENZA VACCINE  Completed   Hepatitis C Screening  Completed   Zoster Vaccines- Shingrix  Completed   HPV VACCINES  Aged Out   COLONOSCOPY (Pts 45-19yr Insurance coverage will need to be confirmed)  Discontinued    Health Maintenance  There are no preventive care reminders to display for this patient.   Colorectal cancer screening: No longer required.   Lung Cancer Screening: (Low Dose CT Chest recommended if Age 77-80years, 30 pack-year currently smoking OR have quit w/in 15years.) does not qualify.   Additional Screening:  Hepatitis C Screening: does qualify; Completed 10/13/17  Vision Screening: Recommended annual ophthalmology exams for early detection of glaucoma and other disorders of the eye. Is the patient up to date with their annual eye exam?  Yes  Who is the provider or what is the name of the office in which the patient attends annual eye exams? Dr.King If pt is not established with a provider, would they like to be referred to a provider to establish care? No .   Dental Screening: Recommended annual dental exams for proper oral hygiene  Community Resource Referral / Chronic Care Management: CRR required this visit?  No   CCM required this visit?  No      Plan:     I have personally reviewed and noted the following in the patient's chart:   Medical and social history Use of alcohol, tobacco or illicit drugs  Current medications and supplements including opioid prescriptions. Patient is not currently taking opioid prescriptions. Functional ability and status Nutritional  status Physical activity Advanced directives List of other physicians Hospitalizations, surgeries, and ER visits in previous 12 months Vitals Screenings to include cognitive, depression, and falls Referrals and appointments  In addition, I have reviewed and discussed with patient certain preventive protocols, quality metrics, and best practice recommendations. A written personalized care plan for preventive services as well as general preventive health recommendations were provided to patient.     LDionisio David LPN   [id number]  Nurse Notes: none

## 2022-03-15 NOTE — Patient Instructions (Signed)
Bruce Gomez , Thank you for taking time to come for your Medicare Wellness Visit. I appreciate your ongoing commitment to your health goals. Please review the following plan we discussed and let me know if I can assist you in the future.   Screening recommendations/referrals: Colonoscopy: aged out Recommended yearly ophthalmology/optometry visit for glaucoma screening and checkup Recommended yearly dental visit for hygiene and checkup  Vaccinations: Influenza vaccine: had one this season already Pneumococcal vaccine: 02/16/21 Tdap vaccine: 03/24/21 Shingles vaccine: Zostavax 07/19/05   Shingrix 06/10/17, 09/02/17   Covid-19: 04/30/19, 05/29/19, 01/31/20, 01/29/21, 12/30/21  Advanced directives: no  Conditions/risks identified: none  Next appointment: Follow up in one year for your annual wellness visit.   Preventive Care 32 Years and Older, Male Preventive care refers to lifestyle choices and visits with your health care provider that can promote health and wellness. What does preventive care include? A yearly physical exam. This is also called an annual well check. Dental exams once or twice a year. Routine eye exams. Ask your health care provider how often you should have your eyes checked. Personal lifestyle choices, including: Daily care of your teeth and gums. Regular physical activity. Eating a healthy diet. Avoiding tobacco and drug use. Limiting alcohol use. Practicing safe sex. Taking low doses of aspirin every day. Taking vitamin and mineral supplements as recommended by your health care provider. What happens during an annual well check? The services and screenings done by your health care provider during your annual well check will depend on your age, overall health, lifestyle risk factors, and family history of disease. Counseling  Your health care provider may ask you questions about your: Alcohol use. Tobacco use. Drug use. Emotional well-being. Home and  relationship well-being. Sexual activity. Eating habits. History of falls. Memory and ability to understand (cognition). Work and work Statistician. Screening  You may have the following tests or measurements: Height, weight, and BMI. Blood pressure. Lipid and cholesterol levels. These may be checked every 5 years, or more frequently if you are over 64 years old. Skin check. Lung cancer screening. You may have this screening every year starting at age 55 if you have a 30-pack-year history of smoking and currently smoke or have quit within the past 15 years. Fecal occult blood test (FOBT) of the stool. You may have this test every year starting at age 19. Flexible sigmoidoscopy or colonoscopy. You may have a sigmoidoscopy every 5 years or a colonoscopy every 10 years starting at age 58. Prostate cancer screening. Recommendations will vary depending on your family history and other risks. Hepatitis C blood test. Hepatitis B blood test. Sexually transmitted disease (STD) testing. Diabetes screening. This is done by checking your blood sugar (glucose) after you have not eaten for a while (fasting). You may have this done every 1-3 years. Abdominal aortic aneurysm (AAA) screening. You may need this if you are a current or former smoker. Osteoporosis. You may be screened starting at age 34 if you are at high risk. Talk with your health care provider about your test results, treatment options, and if necessary, the need for more tests. Vaccines  Your health care provider may recommend certain vaccines, such as: Influenza vaccine. This is recommended every year. Tetanus, diphtheria, and acellular pertussis (Tdap, Td) vaccine. You may need a Td booster every 10 years. Zoster vaccine. You may need this after age 60. Pneumococcal 13-valent conjugate (PCV13) vaccine. One dose is recommended after age 64. Pneumococcal polysaccharide (PPSV23) vaccine. One dose is recommended  after age 32. Talk to your  health care provider about which screenings and vaccines you need and how often you need them. This information is not intended to replace advice given to you by your health care provider. Make sure you discuss any questions you have with your health care provider. Document Released: 04/18/2015 Document Revised: 12/10/2015 Document Reviewed: 01/21/2015 Elsevier Interactive Patient Education  2017 [place] Prevention in the Home Falls can cause injuries. They can happen to people of all ages. There are many things you can do to make your home safe and to help prevent falls. What can I do on the outside of my home? Regularly fix the edges of walkways and driveways and fix any cracks. Remove anything that might make you trip as you walk through a door, such as a raised step or threshold. Trim any bushes or trees on the path to your home. Use bright outdoor lighting. Clear any walking paths of anything that might make someone trip, such as rocks or tools. Regularly check to see if handrails are loose or broken. Make sure that both sides of any steps have handrails. Any raised decks and porches should have guardrails on the edges. Have any leaves, snow, or ice cleared regularly. Use sand or salt on walking paths during winter. Clean up any spills in your garage right away. This includes oil or grease spills. What can I do in the bathroom? Use night lights. Install grab bars by the toilet and in the tub and shower. Do not use towel bars as grab bars. Use non-skid mats or decals in the tub or shower. If you need to sit down in the shower, use a plastic, non-slip stool. Keep the floor dry. Clean up any water that spills on the floor as soon as it happens. Remove soap buildup in the tub or shower regularly. Attach bath mats securely with double-sided non-slip rug tape. Do not have throw rugs and other things on the floor that can make you trip. What can I do in the bedroom? Use night  lights. Make sure that you have a light by your bed that is easy to reach. Do not use any sheets or blankets that are too big for your bed. They should not hang down onto the floor. Have a firm chair that has side arms. You can use this for support while you get dressed. Do not have throw rugs and other things on the floor that can make you trip. What can I do in the kitchen? Clean up any spills right away. Avoid walking on wet floors. Keep items that you use a lot in easy-to-reach places. If you need to reach something above you, use a strong step stool that has a grab bar. Keep electrical cords out of the way. Do not use floor polish or wax that makes floors slippery. If you must use wax, use non-skid floor wax. Do not have throw rugs and other things on the floor that can make you trip. What can I do with my stairs? Do not leave any items on the stairs. Make sure that there are handrails on both sides of the stairs and use them. Fix handrails that are broken or loose. Make sure that handrails are as long as the stairways. Check any carpeting to make sure that it is firmly attached to the stairs. Fix any carpet that is loose or worn. Avoid having throw rugs at the top or bottom of the stairs. If you  do have throw rugs, attach them to the floor with carpet tape. Make sure that you have a light switch at the top of the stairs and the bottom of the stairs. If you do not have them, ask someone to add them for you. What else can I do to help prevent falls? Wear shoes that: Do not have high heels. Have rubber bottoms. Are comfortable and fit you well. Are closed at the toe. Do not wear sandals. If you use a stepladder: Make sure that it is fully opened. Do not climb a closed stepladder. Make sure that both sides of the stepladder are locked into place. Ask someone to hold it for you, if possible. Clearly mark and make sure that you can see: Any grab bars or handrails. First and last  steps. Where the edge of each step is. Use tools that help you move around (mobility aids) if they are needed. These include: Canes. Walkers. Scooters. Crutches. Turn on the lights when you go into a dark area. Replace any light bulbs as soon as they burn out. Set up your furniture so you have a clear path. Avoid moving your furniture around. If any of your floors are uneven, fix them. If there are any pets around you, be aware of where they are. Review your medicines with your doctor. Some medicines can make you feel dizzy. This can increase your chance of falling. Ask your doctor what other things that you can do to help prevent falls. This information is not intended to replace advice given to you by your health care provider. Make sure you discuss any questions you have with your health care provider. Document Released: 01/16/2009 Document Revised: 08/28/2015 Document Reviewed: 04/26/2014 Elsevier Interactive Patient Education  2017 [employer].

## 2022-03-16 ENCOUNTER — Encounter: Payer: Self-pay | Admitting: Oncology

## 2022-03-16 ENCOUNTER — Inpatient Hospital Stay: Payer: Medicare PPO

## 2022-03-16 ENCOUNTER — Inpatient Hospital Stay: Payer: Medicare PPO | Attending: Oncology | Admitting: Oncology

## 2022-03-16 VITALS — BP 145/75 | HR 80 | Temp 96.7°F | Resp 16 | Ht 62.5 in | Wt 165.0 lb

## 2022-03-16 DIAGNOSIS — I10 Essential (primary) hypertension: Secondary | ICD-10-CM | POA: Diagnosis not present

## 2022-03-16 DIAGNOSIS — D649 Anemia, unspecified: Secondary | ICD-10-CM | POA: Diagnosis not present

## 2022-03-16 DIAGNOSIS — R5383 Other fatigue: Secondary | ICD-10-CM | POA: Insufficient documentation

## 2022-03-16 LAB — CBC
HCT: 36.9 % — ABNORMAL LOW (ref 39.0–52.0)
Hemoglobin: 11.5 g/dL — ABNORMAL LOW (ref 13.0–17.0)
MCH: 27.8 pg (ref 26.0–34.0)
MCHC: 31.2 g/dL (ref 30.0–36.0)
MCV: 89.3 fL (ref 80.0–100.0)
Platelets: 238 10*3/uL (ref 150–400)
RBC: 4.13 MIL/uL — ABNORMAL LOW (ref 4.22–5.81)
RDW: 17.2 % — ABNORMAL HIGH (ref 11.5–15.5)
WBC: 8.9 10*3/uL (ref 4.0–10.5)
nRBC: 0 % (ref 0.0–0.2)

## 2022-03-16 LAB — VITAMIN B12: Vitamin B-12: 299 pg/mL (ref 180–914)

## 2022-03-16 LAB — IRON AND TIBC
Iron: 239 ug/dL — ABNORMAL HIGH (ref 45–182)
Saturation Ratios: 56 % — ABNORMAL HIGH (ref 17.9–39.5)
TIBC: 424 ug/dL (ref 250–450)
UIBC: 185 ug/dL

## 2022-03-16 LAB — RETICULOCYTES
Immature Retic Fract: 21.4 % — ABNORMAL HIGH (ref 2.3–15.9)
RBC.: 4.16 MIL/uL — ABNORMAL LOW (ref 4.22–5.81)
Retic Count, Absolute: 80.7 10*3/uL (ref 19.0–186.0)
Retic Ct Pct: 1.9 % (ref 0.4–3.1)

## 2022-03-16 LAB — FERRITIN: Ferritin: 44 ng/mL (ref 24–336)

## 2022-03-16 LAB — FOLATE: Folate: >40 ng/mL (ref >5.9)

## 2022-03-16 LAB — LACTATE DEHYDROGENASE: LDH: 166 U/L (ref 98–192)

## 2022-03-16 NOTE — Progress Notes (Signed)
New patient for anemia. Just recently started on oral iron. Started in 2022. Pt feels fatigued. Low energy. Has some dyspnea with walking. No visible blood anywhere. Normal BM. 2021 last colonoscopy. Appetite is good. Joint pain in bilateral hips and back area.

## 2022-03-16 NOTE — Progress Notes (Signed)
[place]  [phone]) [phone] Fax:(336) [phone]  ID: Bruce Gomez OB: [birth year]  MR#: [id number]  AJO#:[id number]  Patient Care Team: Cletis Athens, MD as PCP - General (Internal Medicine)  I connected with Bruce Gomez on 03/16/22 at  1:30 PM EST by video enabled telemedicine visit and verified that I am speaking with the correct person using two identifiers.   I discussed the limitations, risks, security and privacy concerns of performing an evaluation and management service by telemedicine and the availability of in-person appointments. I also discussed with the patient that there may be a patient responsible charge related to this service. The patient expressed understanding and agreed to proceed.   Other persons participating in the visit and their role in the encounter: Patient, MD.  Patient's location: Clinic Provider's location: Home.  CHIEF COMPLAINT: Anemia, unspecified.  INTERVAL HISTORY: Patient is a 76 year old male who was noted to have a decreasing hemoglobin on routine yearly blood work.  He has noticed a mild fatigue, but otherwise feels well.  He has no neurologic complaints.  He denies any recent fevers or illnesses.  He has a good appetite and denies weight loss.  He has no chest pain, shortness of breath, cough, or hemoptysis.  He denies any nausea, vomiting, constipation, or diarrhea.  He has no urinary complaints.  Patient feels at his baseline and offers no further specific complaints today.  REVIEW OF SYSTEMS:   Review of Systems  Constitutional:  Positive for malaise/fatigue. Negative for fever and weight loss.  Respiratory: Negative.  Negative for cough, hemoptysis and shortness of breath.   Cardiovascular: Negative.  Negative for chest pain and leg swelling.  Gastrointestinal: Negative.  Negative for abdominal pain, blood in stool and melena.  Genitourinary: Negative.  Negative for hematuria.  Musculoskeletal: Negative.  Negative  for back pain.  Skin: Negative.  Negative for rash.  Neurological: Negative.  Negative for dizziness, focal weakness, weakness and headaches.  Psychiatric/Behavioral: Negative.  The patient is not nervous/anxious.     As per HPI. Otherwise, a complete review of systems is negative.  PAST MEDICAL HISTORY: Past Medical History:  Diagnosis Date   Actinic keratosis    Anemia    Aortic sclerosis 04/21/2017   Overview:  Moderate Aortic Sclerosis, mild LVH,  mild Mitral valve thickening, Slight LAE on ECHO - started  on ACE  for  LVH   Arthritis    hips and lower back   BPH with obstruction/lower urinary tract symptoms 03/19/2014   BPH with obstruction/lower urinary tract symptoms 03/19/2014   CAD (coronary artery disease) 04/21/2017   Overview:   silent MI - Inferior; s/p CABG x 3; Stress Echo  6/05; 8/08;  2/12   Chronic prostatitis 11/03/2000   Coronary artery disease    Elevated PSA 03/19/2014   Erectile dysfunction 03/19/2014   History of basal cell carcinoma 07/02/2015   right nose   History of dysplastic nevus 06/22/2017   left ant deltoid   Hyperlipidemia 04/21/2017   Hypertension    Impaired glucose tolerance 04/21/2017   Myocardial infarction ([place]) 2002   OSA (obstructive sleep apnea) 04/21/2017   Overview:  rx with wt loss, side sleeping-no CPAP, not true sleep apnea   RLS (restless legs syndrome) 04/21/2017    PAST SURGICAL HISTORY: Past Surgical History:  Procedure Laterality Date   ADENOIDECTOMY     CARDIAC SURGERY     cabg 2002   COLONOSCOPY WITH PROPOFOL N/A 12/14/2019   Procedure: COLONOSCOPY WITH PROPOFOL;  Surgeon:  Lucilla Lame, MD;  Location: [facility];  Service: Endoscopy;  Laterality: N/A;  priority 4   CORONARY ARTERY BYPASS GRAFT  2002   x3   INGUINAL HERNIA REPAIR     POLYPECTOMY  12/14/2019   Procedure: POLYPECTOMY;  Surgeon: Lucilla Lame, MD;  Location: [facility];  Service: Endoscopy;;   TRANSURETHRAL RESECTION OF PROSTATE      2014    FAMILY HISTORY: Family History  Problem Relation Age of Onset   Prostate cancer Paternal Uncle    Bladder Cancer Neg Hx    Kidney cancer Neg Hx     ADVANCED DIRECTIVES (Y/N):  N  HEALTH MAINTENANCE: Social History   Tobacco Use   Smoking status: Never   Smokeless tobacco: Never  Vaping Use   Vaping Use: Never used  Substance Use Topics   Alcohol use: Yes    Alcohol/week: 1.0 - 2.0 standard drink of alcohol    Types: 1 - 2 Glasses of wine per week    Comment: ocassional glass of wine   Drug use: No     Colonoscopy:  PAP:  Bone density:  Lipid panel:  Allergies  Allergen Reactions   Pollen Extract     seasonal   Sulfa Antibiotics Other (See Comments)    Dizziness    Sulfamethoxazole-Trimethoprim     dizzy dizzy    Benadryl [Diphenhydramine Hcl (Sleep)] Anxiety    Current Outpatient Medications  Medication Sig Dispense Refill   aspirin EC 81 MG tablet Take 81 mg by mouth.     atorvastatin (LIPITOR) 10 MG tablet TAKE 1 TABLET EVERY DAY 90 tablet 3   B Complex Vitamins (B COMPLEX 1 PO) Take by mouth. Takes 1 a week     calcium carbonate (OSCAL) 1500 (600 Ca) MG TABS tablet Take by mouth.     Cholecalciferol (VITAMIN D3) 2000 units capsule Take by mouth.     clonazePAM (KLONOPIN) 0.5 MG tablet Take 1 tablet (0.5 mg total) by mouth at bedtime. 30 tablet 0   Coenzyme Q10 100 MG capsule Take 200 mg by mouth.     esomeprazole (NEXIUM) 40 MG capsule TAKE 1 CAPSULE EVERY DAY 90 capsule 2   ferrous sulfate 324 MG TBEC Take 324 mg by mouth daily.     finasteride (PROSCAR) 5 MG tablet TAKE 1 TABLET (5 MG TOTAL) DAILY. 90 tablet 3   folic acid (FOLVITE) 622 MCG tablet Take by mouth.     lisinopril (ZESTRIL) 10 MG tablet TAKE 1 TABLET EVERY DAY 90 tablet 3   Melatonin 3 MG TABS Take by mouth.     metoprolol succinate (TOPROL-XL) 25 MG 24 hr tablet TAKE 1/2 TABLET TWICE DAILY 90 tablet 3   niacin (NIASPAN) 500 MG CR tablet TAKE 4 TABLETS AT BEDTIME 360 tablet 3    Omega-3 1000 MG CAPS Take by mouth.     pseudoephedrine (SUDAFED) 30 MG tablet Take by mouth.     sildenafil (VIAGRA) 100 MG tablet TAKE ONE TABLET BY MOUTH ONE HOUR PRIOR TO INTERCOURSE AS NEEDED 90 tablet 1   vitamin C (ASCORBIC ACID) 500 MG tablet Take 500 mg by mouth.     No current facility-administered medications for this visit.    OBJECTIVE: Vitals:   03/16/22 1344  BP: (!) 145/75  Pulse: 80  Resp: 16  Temp: (!) 96.7 F (35.9 C)  SpO2: 100%     Body mass index is 29.7 kg/m.    ECOG FS:0 - Asymptomatic  General:  Well-developed, well-nourished, no acute distress. HEENT: Normocephalic. Neuro: Alert, answering all questions appropriately. Cranial nerves grossly intact. Psych: Normal affect.  LAB RESULTS:  Lab Results  Component Value Date   NA 139 02/24/2022   K 4.4 02/24/2022   CL 105 02/24/2022   CO2 26 02/24/2022   GLUCOSE 103 (H) 02/24/2022   BUN 18 02/24/2022   CREATININE 1.04 02/24/2022   CALCIUM 9.4 02/24/2022   PROT 6.6 02/24/2022   ALBUMIN 4.3 10/13/2017   AST 18 02/24/2022   ALT 18 02/24/2022   ALKPHOS 85 10/13/2017   BILITOT 0.4 02/24/2022   GFRNONAA 58 (L) 01/31/2020   GFRAA 67 01/31/2020    Lab Results  Component Value Date   WBC 8.9 03/16/2022   NEUTROABS 7,160 02/24/2022   HGB 11.5 (L) 03/16/2022   HCT 36.9 (L) 03/16/2022   MCV 89.3 03/16/2022   PLT 238 03/16/2022     STUDIES: No results found.  ASSESSMENT: Anemia, unspecified.  PLAN:    Anemia, unspecified: Patient's hemoglobin is only mildly reduced at 11.5.  His absolute reticulocyte count is appropriately normal.  The remainder of his laboratory work including iron stores, B12, folate, hemolysis labs, SPEP, and IntelliGEN myeloid panel were drawn for completeness and are pending at time of dictation.  No intervention is needed at this time.  Patient does not require bone marrow biopsy.  Return to clinic in 1 month for further evaluation and discussion of his laboratory  results. Hypertension: Patient's blood pressure is mildly elevated today.  Continue monitoring and treatment per primary care.  I spent a total of 45 minutes reviewing chart data, evaluation with the patient, counseling and coordination of care as detailed above.   Patient expressed understanding and was in agreement with this plan. He also understands that He can call clinic at any time with any questions, concerns, or complaints.     Lloyd Huger, MD   03/16/2022 3:03 PM

## 2022-03-17 LAB — PROTEIN ELECTROPHORESIS, SERUM
A/G Ratio: 1.3 (ref 0.7–1.7)
Albumin ELP: 3.9 g/dL (ref 2.9–4.4)
Alpha-1-Globulin: 0.3 g/dL (ref 0.0–0.4)
Alpha-2-Globulin: 0.8 g/dL (ref 0.4–1.0)
Beta Globulin: 0.9 g/dL (ref 0.7–1.3)
Gamma Globulin: 0.9 g/dL (ref 0.4–1.8)
Globulin, Total: 2.9 g/dL (ref 2.2–3.9)
Total Protein ELP: 6.8 g/dL (ref 6.0–8.5)

## 2022-03-19 LAB — HAPTOGLOBIN: Haptoglobin: 227 mg/dL (ref 34–355)

## 2022-03-19 NOTE — Progress Notes (Signed)
I have reviewed this visit and agree with the documentation.   

## 2022-03-30 LAB — INTELLIGEN MYELOID

## 2022-04-14 DIAGNOSIS — H2513 Age-related nuclear cataract, bilateral: Secondary | ICD-10-CM | POA: Diagnosis not present

## 2022-04-26 ENCOUNTER — Inpatient Hospital Stay: Payer: Medicare PPO | Attending: Oncology | Admitting: Oncology

## 2022-04-26 ENCOUNTER — Ambulatory Visit (INDEPENDENT_AMBULATORY_CARE_PROVIDER_SITE_OTHER): Payer: Medicare PPO | Admitting: Dermatology

## 2022-04-26 ENCOUNTER — Encounter: Payer: Self-pay | Admitting: Oncology

## 2022-04-26 ENCOUNTER — Other Ambulatory Visit: Payer: Self-pay | Admitting: Internal Medicine

## 2022-04-26 VITALS — BP 158/81 | HR 76 | Temp 98.2°F | Resp 16 | Ht 62.5 in | Wt 165.0 lb

## 2022-04-26 DIAGNOSIS — Z85828 Personal history of other malignant neoplasm of skin: Secondary | ICD-10-CM

## 2022-04-26 DIAGNOSIS — L578 Other skin changes due to chronic exposure to nonionizing radiation: Secondary | ICD-10-CM | POA: Diagnosis not present

## 2022-04-26 DIAGNOSIS — C44311 Basal cell carcinoma of skin of nose: Secondary | ICD-10-CM

## 2022-04-26 DIAGNOSIS — F419 Anxiety disorder, unspecified: Secondary | ICD-10-CM

## 2022-04-26 DIAGNOSIS — L570 Actinic keratosis: Secondary | ICD-10-CM | POA: Diagnosis not present

## 2022-04-26 DIAGNOSIS — D235 Other benign neoplasm of skin of trunk: Secondary | ICD-10-CM | POA: Diagnosis not present

## 2022-04-26 DIAGNOSIS — Z79899 Other long term (current) drug therapy: Secondary | ICD-10-CM | POA: Diagnosis not present

## 2022-04-26 DIAGNOSIS — I10 Essential (primary) hypertension: Secondary | ICD-10-CM | POA: Insufficient documentation

## 2022-04-26 DIAGNOSIS — Z1283 Encounter for screening for malignant neoplasm of skin: Secondary | ICD-10-CM

## 2022-04-26 DIAGNOSIS — D229 Melanocytic nevi, unspecified: Secondary | ICD-10-CM

## 2022-04-26 DIAGNOSIS — L814 Other melanin hyperpigmentation: Secondary | ICD-10-CM

## 2022-04-26 DIAGNOSIS — D492 Neoplasm of unspecified behavior of bone, soft tissue, and skin: Secondary | ICD-10-CM

## 2022-04-26 DIAGNOSIS — D239 Other benign neoplasm of skin, unspecified: Secondary | ICD-10-CM

## 2022-04-26 DIAGNOSIS — C4491 Basal cell carcinoma of skin, unspecified: Secondary | ICD-10-CM

## 2022-04-26 DIAGNOSIS — D649 Anemia, unspecified: Secondary | ICD-10-CM | POA: Diagnosis not present

## 2022-04-26 DIAGNOSIS — L821 Other seborrheic keratosis: Secondary | ICD-10-CM

## 2022-04-26 DIAGNOSIS — Z5111 Encounter for antineoplastic chemotherapy: Secondary | ICD-10-CM

## 2022-04-26 DIAGNOSIS — Z86018 Personal history of other benign neoplasm: Secondary | ICD-10-CM

## 2022-04-26 HISTORY — DX: Basal cell carcinoma of skin, unspecified: C44.91

## 2022-04-26 MED ORDER — FLUOROURACIL 5 % EX CREA: TOPICAL_CREAM | CUTANEOUS | 2 refills | Status: DC

## 2022-04-26 NOTE — Progress Notes (Signed)
[place]  [phone] Fax:(336) [phone]  ID: Bruce Gomez OB: [birth year]  MR#: [id number]  DJM#:[id number]  Patient Care Team: Cletis Athens, MD as PCP - General (Internal Medicine)   CHIEF COMPLAINT: Anemia, unspecified.  INTERVAL HISTORY: Patient returns to clinic today for further evaluation and discussion of his laboratory results.  He currently feels well and is asymptomatic.  He does not complain of any weakness or fatigue today. He has no neurologic complaints.  He denies any recent fevers or illnesses.  He has a good appetite and denies weight loss.  He has no chest pain, shortness of breath, cough, or hemoptysis.  He denies any nausea, vomiting, constipation, or diarrhea.  He has no urinary complaints.  Patient offers no specific complaints today.  REVIEW OF SYSTEMS:   Review of Systems  Constitutional: Negative.  Negative for fever, malaise/fatigue and weight loss.  Respiratory: Negative.  Negative for cough, hemoptysis and shortness of breath.   Cardiovascular: Negative.  Negative for chest pain and leg swelling.  Gastrointestinal: Negative.  Negative for abdominal pain, blood in stool and melena.  Genitourinary: Negative.  Negative for hematuria.  Musculoskeletal: Negative.  Negative for back pain.  Skin: Negative.  Negative for rash.  Neurological: Negative.  Negative for dizziness, focal weakness, weakness and headaches.  Psychiatric/Behavioral: Negative.  The patient is not nervous/anxious.     As per HPI. Otherwise, a complete review of systems is negative.  PAST MEDICAL HISTORY: Past Medical History:  Diagnosis Date   Actinic keratosis    Anemia    Aortic sclerosis 04/21/2017   Overview:  Moderate Aortic Sclerosis, mild LVH,  mild Mitral valve thickening, Slight LAE on ECHO - started  on ACE  for  LVH   Arthritis    hips and lower back   BPH with obstruction/lower urinary tract symptoms 03/19/2014   BPH with obstruction/lower  urinary tract symptoms 03/19/2014   CAD (coronary artery disease) 04/21/2017   Overview:   silent MI - Inferior; s/p CABG x 3; Stress Echo  6/05; 8/08;  2/12   Chronic prostatitis 11/03/2000   Coronary artery disease    Elevated PSA 03/19/2014   Erectile dysfunction 03/19/2014   History of basal cell carcinoma 07/02/2015   right nose   History of dysplastic nevus 06/22/2017   left ant deltoid   Hyperlipidemia 04/21/2017   Hypertension    Impaired glucose tolerance 04/21/2017   Myocardial infarction ([place]) 2002   OSA (obstructive sleep apnea) 04/21/2017   Overview:  rx with wt loss, side sleeping-no CPAP, not true sleep apnea   RLS (restless legs syndrome) 04/21/2017    PAST SURGICAL HISTORY: Past Surgical History:  Procedure Laterality Date   ADENOIDECTOMY     CARDIAC SURGERY     cabg 2002   COLONOSCOPY WITH PROPOFOL N/A 12/14/2019   Procedure: COLONOSCOPY WITH PROPOFOL;  Surgeon: Lucilla Lame, MD;  Location: [facility];  Service: Endoscopy;  Laterality: N/A;  priority 4   CORONARY ARTERY BYPASS GRAFT  2002   x3   INGUINAL HERNIA REPAIR     POLYPECTOMY  12/14/2019   Procedure: POLYPECTOMY;  Surgeon: Lucilla Lame, MD;  Location: [facility];  Service: Endoscopy;;   TRANSURETHRAL RESECTION OF PROSTATE     2014    FAMILY HISTORY: Family History  Problem Relation Age of Onset   Prostate cancer Paternal Uncle    Bladder Cancer Neg Hx    Kidney cancer Neg Hx     ADVANCED DIRECTIVES (Y/N):  N  HEALTH MAINTENANCE: Social History   Tobacco Use   Smoking status: Never   Smokeless tobacco: Never  Vaping Use   Vaping Use: Never used  Substance Use Topics   Alcohol use: Yes    Alcohol/week: 1.0 - 2.0 standard drink of alcohol    Types: 1 - 2 Glasses of wine per week    Comment: ocassional glass of wine   Drug use: No     Colonoscopy:  PAP:  Bone density:  Lipid panel:  Allergies  Allergen Reactions   Pollen Extract     seasonal   Sulfa  Antibiotics Other (See Comments)    Dizziness    Sulfamethoxazole-Trimethoprim     dizzy dizzy    Benadryl [Diphenhydramine Hcl (Sleep)] Anxiety    Current Outpatient Medications  Medication Sig Dispense Refill   aspirin EC 81 MG tablet Take 81 mg by mouth.     atorvastatin (LIPITOR) 10 MG tablet TAKE 1 TABLET EVERY DAY 90 tablet 3   B Complex Vitamins (B COMPLEX 1 PO) Take by mouth. Takes 2 a week     calcium carbonate (OSCAL) 1500 (600 Ca) MG TABS tablet Take by mouth.     Cholecalciferol (VITAMIN D3) 2000 units capsule Take by mouth.     clonazePAM (KLONOPIN) 0.5 MG tablet Take 1 tablet (0.5 mg total) by mouth at bedtime. 30 tablet 0   Coenzyme Q10 100 MG capsule Take 200 mg by mouth. Takes 2 times week     esomeprazole (NEXIUM) 40 MG capsule TAKE 1 CAPSULE EVERY DAY (Patient taking differently: Take 40 mg by mouth as needed.) 90 capsule 2   ferrous sulfate 324 MG TBEC Take 324 mg by mouth daily.     finasteride (PROSCAR) 5 MG tablet TAKE 1 TABLET (5 MG TOTAL) DAILY. 90 tablet 3   folic acid (FOLVITE) 161 MCG tablet Take by mouth. 2 times a week     lisinopril (ZESTRIL) 10 MG tablet TAKE 1 TABLET EVERY DAY 90 tablet 3   Melatonin 3 MG TABS Take by mouth.     metoprolol succinate (TOPROL-XL) 25 MG 24 hr tablet TAKE 1/2 TABLET TWICE DAILY 90 tablet 3   niacin (NIASPAN) 500 MG CR tablet TAKE 4 TABLETS AT BEDTIME 360 tablet 3   Omega-3 1000 MG CAPS Take by mouth.     pseudoephedrine (SUDAFED) 30 MG tablet Take by mouth.     sildenafil (VIAGRA) 100 MG tablet TAKE ONE TABLET BY MOUTH ONE HOUR PRIOR TO INTERCOURSE AS NEEDED 90 tablet 1   vitamin C (ASCORBIC ACID) 500 MG tablet Take 500 mg by mouth.     No current facility-administered medications for this visit.    OBJECTIVE: Vitals:   04/26/22 0950  BP: (!) 158/81  Pulse: 76  Resp: 16  Temp: 98.2 F (36.8 C)  SpO2: 100%     Body mass index is 29.7 kg/m.    ECOG FS:0 - Asymptomatic  General: Well-developed, well-nourished,  no acute distress. Eyes: Pink conjunctiva, anicteric sclera. HEENT: Normocephalic, moist mucous membranes. Lungs: No audible wheezing or coughing. Heart: Regular rate and rhythm. Abdomen: Soft, nontender, no obvious distention. Musculoskeletal: No edema, cyanosis, or clubbing. Neuro: Alert, answering all questions appropriately. Cranial nerves grossly intact. Skin: No rashes or petechiae noted. Psych: Normal affect.  LAB RESULTS:  Lab Results  Component Value Date   NA 139 02/24/2022   K 4.4 02/24/2022   CL 105 02/24/2022   CO2 26 02/24/2022   GLUCOSE 103 (H)  02/24/2022   BUN 18 02/24/2022   CREATININE 1.04 02/24/2022   CALCIUM 9.4 02/24/2022   PROT 6.6 02/24/2022   ALBUMIN 4.3 10/13/2017   AST 18 02/24/2022   ALT 18 02/24/2022   ALKPHOS 85 10/13/2017   BILITOT 0.4 02/24/2022   GFRNONAA 58 (L) 01/31/2020   GFRAA 67 01/31/2020    Lab Results  Component Value Date   WBC 8.9 03/16/2022   NEUTROABS 7,160 02/24/2022   HGB 11.5 (L) 03/16/2022   HCT 36.9 (L) 03/16/2022   MCV 89.3 03/16/2022   PLT 238 03/16/2022   Lab Results  Component Value Date   IRON 239 (H) 03/16/2022   TIBC 424 03/16/2022   IRONPCTSAT 56 (H) 03/16/2022   Lab Results  Component Value Date   FERRITIN 44 03/16/2022     STUDIES: No results found.  ASSESSMENT: Anemia, unspecified.  PLAN:    Anemia, unspecified: Patient's hemoglobin is only mildly reduced at 11.5.  All of his other laboratory work including iron stores, B12, folate, hemolysis labs, SPEP, and IntelliGEN myeloid panel are either negative or within normal limits.  No intervention is needed at this time.  Patient does not require bone marrow biopsy.  No further follow-up is scheduled.  Please continue to monitor patient CBC and refer back if hemoglobin falls below 10.0. Hypertension: Patient's blood pressure is moderately elevated today.  Continue monitoring and treatment per primary care.   Patient expressed understanding and was  in agreement with this plan. He also understands that He can call clinic at any time with any questions, concerns, or complaints.     Lloyd Huger, MD   04/26/2022 1:15 PM

## 2022-04-26 NOTE — Progress Notes (Signed)
Follow-Up Visit   Subjective  Bruce Gomez is a 77 y.o. male who presents for the following: Actinic Keratosis. 6 months f/u on precancers on his face and nose and mole check. Hx of BCC removed from the right side of nose in 2017 The patient presents for Total-Body Skin Exam (TBSE) for skin cancer screening and mole check.  The patient has spots, moles and lesions to be evaluated, some may be new or changing and the patient has concerns that these could be cancer.   Wife with patient   The following portions of the chart were reviewed this encounter and updated as appropriate:   Tobacco  Allergies  Meds  Problems  Med Hx  Surg Hx  Fam Hx     Review of Systems:  No other skin or systemic complaints except as noted in HPI or Assessment and Plan.  Objective  Well appearing patient in no apparent distress; mood and affect are within normal limits.  A full examination was performed including scalp, head, eyes, ears, nose, lips, neck, chest, axillae, abdomen, back, buttocks, bilateral upper extremities, bilateral lower extremities, hands, feet, fingers, toes, fingernails, and toenails. All findings within normal limits unless otherwise noted below.  right nose 1.1 cm Erythematous papule      left buttock 0.2 cm blue nevus        Scalp x 10 (10) Erythematous thin papules/macules with gritty scale.    Assessment & Plan  Neoplasm of skin right nose Epidermal / dermal shaving  Informed consent: discussed and consent obtained   Timeout: patient name, date of birth, surgical site, and procedure verified   Procedure prep:  Patient was prepped and draped in usual sterile fashion Prep type:  Isopropyl alcohol Anesthesia: the lesion was anesthetized in a standard fashion   Anesthetic:  1% lidocaine w/ epinephrine 1-100,000 buffered w/ 8.4% NaHCO3 Hemostasis achieved with: pressure, aluminum chloride and electrodesiccation   Outcome: patient tolerated procedure well    Post-procedure details: sterile dressing applied and wound care instructions given   Dressing type: bandage and petrolatum    Destruction of lesion  Destruction method: electrodesiccation and curettage   Informed consent: discussed and consent obtained   Timeout:  patient name, date of birth, surgical site, and procedure verified Anesthesia: the lesion was anesthetized in a standard fashion   Anesthetic:  1% lidocaine w/ epinephrine 1-100,000 buffered w/ 8.4% NaHCO3 Curettage performed in three different directions: Yes   Electrodesiccation performed over the curetted area: Yes   Curettage cycles:  3 Lesion length (cm):  1.1 Lesion width (cm):  1.1 Margin per side (cm):  0.2 Final wound size (cm):  1.5 Hemostasis achieved with:  electrodesiccation Outcome: patient tolerated procedure well with no complications   Post-procedure details: sterile dressing applied and wound care instructions given   Dressing type: petrolatum    Specimen 1 - Surgical pathology Differential Diagnosis: R/O BCC Check Margins: No  Blue nevus left buttock The patient will observe these symptoms, and report promptly any worsening or unexpected persistence.  If well, may return prn.   AK (actinic keratosis) (10) Scalp x 10 Actinic keratoses are precancerous spots that appear secondary to cumulative UV radiation exposure/sun exposure over time. They are chronic with expected duration over 1 year. A portion of actinic keratoses will progress to squamous cell carcinoma of the skin. It is not possible to reliably predict which spots will progress to skin cancer and so treatment is recommended to prevent development of skin cancer.  Recommend  daily broad spectrum sunscreen SPF 30+ to sun-exposed areas, reapply every 2 hours as needed.  Recommend staying in the shade or wearing long sleeves, sun glasses (UVA+UVB protection) and wide brim hats (4-inch brim around the entire circumference of the hat). Call for new  or changing lesions.  Destruction of lesion - Scalp x 10 AK (actinic keratosis) (10) Scalp x 10  Actinic keratoses are precancerous spots that appear secondary to cumulative UV radiation exposure/sun exposure over time. They are chronic with expected duration over 1 year. A portion of actinic keratoses will progress to squamous cell carcinoma of the skin. It is not possible to reliably predict which spots will progress to skin cancer and so treatment is recommended to prevent development of skin cancer.  Recommend daily broad spectrum sunscreen SPF 30+ to sun-exposed areas, reapply every 2 hours as needed.  Recommend staying in the shade or wearing long sleeves, sun glasses (UVA+UVB protection) and wide brim hats (4-inch brim around the entire circumference of the hat). Call for new or changing lesions.   Destruction of lesion - Scalp x 10 Complexity: simple   Destruction method: cryotherapy   Informed consent: discussed and consent obtained   Timeout:  patient name, date of birth, surgical site, and procedure verified Lesion destroyed using liquid nitrogen: Yes   Region frozen until ice ball extended beyond lesion: Yes   Outcome: patient tolerated procedure well with no complications   Post-procedure details: wound care instructions given    Related Medications fluorouracil (EFUDEX) 5 % cream Apply to affected areas on his scalp twice a day x 7 days  Lentigines - Scattered tan macules - Due to sun exposure - Benign-appearing, observe - Recommend daily broad spectrum sunscreen SPF 30+ to sun-exposed areas, reapply every 2 hours as needed. - Call for any changes  Seborrheic Keratoses - Stuck-on, waxy, tan-brown papules and/or plaques  - Benign-appearing - Discussed benign etiology and prognosis. - Observe - Call for any changes  Melanocytic Nevi - Tan-brown and/or pink-flesh-colored symmetric macules and papules - Benign appearing on exam today - Observation - Call clinic  for new or changing moles - Recommend daily use of broad spectrum spf 30+ sunscreen to sun-exposed areas.   Hemangiomas - Red papules - Discussed benign nature - Observe - Call for any changes  Actinic Damage with PreCancerous Actinic Keratoses Counseling for Topical Chemotherapy Management: Patient exhibits: - Severe, confluent actinic changes with pre-cancerous actinic keratoses that is secondary to cumulative UV radiation exposure over time - Condition that is severe; chronic, not at goal. - diffuse scaly erythematous macules and papules with underlying dyspigmentation - Discussed Prescription "Field Treatment" topical Chemotherapy for Severe, Chronic Confluent Actinic Changes with Pre-Cancerous Actinic Keratoses  In 1 month begin 5FU/calcipotriene cream apply to scalp twice a day for 7 days.   Field treatment involves treatment of an entire area of skin that has confluent Actinic Changes (Sun/ Ultraviolet light damage) and PreCancerous Actinic Keratoses by method of PhotoDynamic Therapy (PDT) and/or prescription Topical Chemotherapy agents such as 5-fluorouracil, 5-fluorouracil/calcipotriene, and/or imiquimod.  The purpose is to decrease the number of clinically evident and subclinical PreCancerous lesions to prevent progression to development of skin cancer by chemically destroying early precancer changes that may or may not be visible.  It has been shown to reduce the risk of developing skin cancer in the treated area. As a result of treatment, redness, scaling, crusting, and open sores may occur during treatment course. One or more than one of these methods may  be used and may have to be used several times to control, suppress and eliminate the PreCancerous changes. Discussed treatment course, expected reaction, and possible side effects. - Recommend daily broad spectrum sunscreen SPF 30+ to sun-exposed areas, reapply every 2 hours as needed.  - Staying in the shade or wearing long  sleeves, sun glasses (UVA+UVB protection) and wide brim hats (4-inch brim around the entire circumference of the hat) are also recommended. - Call for new or changing lesions.   History of Dysplastic Nevi Left ant deltoid  - No evidence of recurrence today - Recommend regular full body skin exams - Recommend daily broad spectrum sunscreen SPF 30+ to sun-exposed areas, reapply every 2 hours as needed.  - Call if any new or changing lesions are noted between office visits   History of Basal Cell Carcinoma of the Skin Right nose  - Recommend regular full body skin exams - Recommend daily broad spectrum sunscreen SPF 30+ to sun-exposed areas, reapply every 2 hours as needed.  - Call if any new or changing lesions are noted between office visits   Skin cancer screening performed today.   Return in about 3 months (around 07/26/2022) for Aks .  IMarye Round, CMA, am acting as scribe for Sarina Ser, MD .  Documentation: I have reviewed the above documentation for accuracy and completeness, and I agree with the above.  Sarina Ser, MD

## 2022-04-26 NOTE — Patient Instructions (Addendum)
Instructions for [facility] Medications  One or more of your medications was sent to the [facility] mail order compounding pharmacy. You will receive an email from them and can purchase the medicine through that link. It will then be mailed to your home at the address you confirmed. If for any reason you do not receive an email from them, please check your spam folder. If you still do not find the email, please let us know. [facility] phone number is [phone].       5-Fluorouracil/Calcipotriene Patient Education  Start in 1 month apply to scalp twice a day for 7 days  Actinic keratoses are the dry, red scaly spots on the skin caused by sun damage. A portion of these spots can turn into skin cancer with time, and treating them can help prevent development of skin cancer.   Treatment of these spots requires removal of the defective skin cells. There are various ways to remove actinic keratoses, including freezing with liquid nitrogen, treatment with creams, or treatment with a blue light procedure in the office.   5-fluorouracil cream is a topical cream used to treat actinic keratoses. It works by interfering with the growth of abnormal fast-growing skin cells, such as actinic keratoses. These cells peel off and are replaced by healthy ones.   5-fluorouracil/calcipotriene is a combination of the 5-fluorouracil cream with a vitamin D analog cream called calcipotriene. The calcipotriene alone does not treat actinic keratoses. However, when it is combined with 5-fluorouracil, it helps the 5-fluorouracil treat the actinic keratoses much faster so that the same results can be achieved with a much shorter treatment time.  INSTRUCTIONS FOR 5-FLUOROURACIL/CALCIPOTRIENE CREAM:   5-fluorouracil/calcipotriene cream typically only needs to be used for 4-7 days. A thin layer should be applied twice a day to the treatment areas recommended by your physician.   If your physician prescribed  you separate tubes of 5-fluourouracil and calcipotriene, apply a thin layer of 5-fluorouracil followed by a thin layer of calcipotriene.   Avoid contact with your eyes, nostrils, and mouth. Do not use 5-fluorouracil/calcipotriene cream on infected or open wounds.   You will develop redness, irritation and some crusting at areas where you have pre-cancer damage/actinic keratoses. IF YOU DEVELOP PAIN, BLEEDING, OR SIGNIFICANT CRUSTING, STOP THE TREATMENT EARLY - you have already gotten a good response and the actinic keratoses should clear up well.  Wash your hands after applying 5-fluorouracil 5% cream on your skin.   A moisturizer or sunscreen with a minimum SPF 30 should be applied each morning.   Once you have finished the treatment, you can apply a thin layer of Vaseline twice a day to irritated areas to soothe and calm the areas more quickly. If you experience significant discomfort, contact your physician.  For some patients it is necessary to repeat the treatment for best results.  SIDE EFFECTS: When using 5-fluorouracil/calcipotriene cream, you may have mild irritation, such as redness, dryness, swelling, or a mild burning sensation. This usually resolves within 2 weeks. The more actinic keratoses you have, the more redness and inflammation you can expect during treatment. Eye irritation has been reported rarely. If this occurs, please let us know.  If you have any trouble using this cream, please call the office. If you have any other questions about this information, please do not hesitate to ask me before you leave the office.     Cryotherapy Aftercare  Wash gently with soap and water everyday.   Apply Vaseline and Band-Aid  daily until healed.    Wound Care Instructions  Cleanse wound gently with soap and water once a day then pat dry with clean gauze. Apply a thin coat of Petrolatum (petroleum jelly, "Vaseline") over the wound (unless you have an allergy to this). We recommend  that you use a new, sterile tube of Vaseline. Do not pick or remove scabs. Do not remove the yellow or white "healing tissue" from the base of the wound.  Cover the wound with fresh, clean, nonstick gauze and secure with paper tape. You may use Band-Aids in place of gauze and tape if the wound is small enough, but would recommend trimming much of the tape off as there is often too much. Sometimes Band-Aids can irritate the skin.  You should call the office for your biopsy report after 1 week if you have not already been contacted.  If you experience any problems, such as abnormal amounts of bleeding, swelling, significant bruising, significant pain, or evidence of infection, please call the office immediately.  FOR ADULT SURGERY PATIENTS: If you need something for pain relief you may take 1 extra strength Tylenol (acetaminophen) AND 2 Ibuprofen ('200mg'$  each) together every 4 hours as needed for pain. (do not take these if you are allergic to them or if you have a reason you should not take them.) Typically, you may only need pain medication for 1 to 3 days.       Due to recent changes in healthcare laws, you may see results of your pathology and/or laboratory studies on MyChart before the doctors have had a chance to review them. We understand that in some cases there may be results that are confusing or concerning to you. Please understand that not all results are received at the same time and often the doctors may need to interpret multiple results in order to provide you with the best plan of care or course of treatment. Therefore, we ask that you please give [place] 2 business days to thoroughly review all your results before contacting the office for clarification. Should we see a critical lab result, you will be contacted sooner.   If You Need Anything After Your Visit  If you have any questions or concerns for your doctor, please call our main line at [phone] and press option 4 to reach your  doctor's medical assistant. If no one answers, please leave a voicemail as directed and we will return your call as soon as possible. Messages left after 4 pm will be answered the following business day.   You may also send [place] a message via [place]. We typically respond to MyChart messages within 1-2 business days.  For prescription refills, please ask your pharmacy to contact our office. Our fax number is [phone].  If you have an urgent issue when the clinic is closed that cannot wait until the next business day, you can page your doctor at the number below.    Please note that while we do our best to be available for urgent issues outside of office hours, we are not available 24/7.   If you have an urgent issue and are unable to reach [place], you may choose to seek medical care at your doctor's office, retail clinic, urgent care center, or emergency room.  If you have a medical emergency, please immediately call 911 or go to the emergency department.  Pager Numbers  - Dr. Nehemiah Massed: [phone]  - Dr. Laurence Ferrari: [phone]  - Dr. Nicole Kindred: [phone]  In the event  of inclement weather, please call our main line at [phone] for an update on the status of any delays or closures.  Dermatology Medication Tips: Please keep the boxes that topical medications come in in order to help keep track of the instructions about where and how to use these. Pharmacies typically print the medication instructions only on the boxes and not directly on the medication tubes.   If your medication is too expensive, please contact our office at [phone] option 4 or send [place] a message through [place].   We are unable to tell what your co-pay for medications will be in advance as this is different depending on your insurance coverage. However, we may be able to find a substitute medication at lower cost or fill out paperwork to get insurance to cover a needed medication.   If a prior authorization is  required to get your medication covered by your insurance company, please allow [place] 1-2 business days to complete this process.  Drug prices often vary depending on where the prescription is filled and some pharmacies may offer cheaper prices.  The website www.goodrx.com contains coupons for medications through different pharmacies. The prices here do not account for what the cost may be with help from insurance (it may be cheaper with your insurance), but the website can give you the price if you did not use any insurance.  - You can print the associated coupon and take it with your prescription to the pharmacy.  - You may also stop by our office during regular business hours and pick up a GoodRx coupon card.  - If you need your prescription sent electronically to a different pharmacy, notify our office through [facility] or by phone at [phone] option 4.     Si Usted Necesita Algo Despus de Su Visita  Tambin puede enviarnos un mensaje a travs de Pharmacist, community. Por lo general respondemos a los mensajes de MyChart en el transcurso de 1 a 2 das hbiles.  Para renovar recetas, por favor pida a su farmacia que se ponga en contacto con nuestra oficina. Harland Dingwall de fax es [place] [phone].  Si tiene un asunto urgente cuando la clnica est cerrada y que no puede esperar hasta el siguiente da hbil, puede llamar/localizar a su doctor(a) al nmero que aparece a continuacin.   Por favor, tenga en cuenta que aunque hacemos todo lo posible para estar disponibles para asuntos urgentes fuera del horario de [place], no estamos disponibles las 24 horas del da, los 7 das de la [place].   Si tiene un problema urgente y no puede comunicarse con nosotros, puede optar por buscar atencin mdica  en el consultorio de su doctor(a), en una clnica privada, en un centro de atencin urgente o en una sala de emergencias.  Si tiene Engineering geologist, por favor llame inmediatamente al 911 o vaya a  la sala de emergencias.  Nmeros de bper  - Dr. Nehemiah Massed: [phone]  - Dra. Moye: [phone]  - Dra. Nicole Kindred: [phone]  En caso de inclemencias del [place], por favor llame a Johnsie Kindred principal al [phone] para una actualizacin sobre el [place] de cualquier retraso o cierre.  Consejos para la medicacin en dermatologa: Por favor, guarde las cajas en las que vienen los medicamentos de uso tpico para ayudarle a seguir las instrucciones sobre dnde y cmo usarlos. Las farmacias generalmente imprimen las instrucciones del medicamento slo en las cajas y no directamente en los tubos del [place].   Si su medicamento  es muy caro, por favor, pngase en contacto con Zigmund Daniel llamando al [phone] y presione la opcin 4 o envenos un mensaje a travs de Pharmacist, community.   No podemos decirle cul ser su copago por los medicamentos por adelantado ya que esto es diferente dependiendo de la cobertura de su seguro. Sin embargo, es posible que podamos encontrar un medicamento sustituto a Electrical engineer un formulario para que el seguro cubra el medicamento que se considera necesario.   Si se requiere una autorizacin previa para que su compaa de seguros [place] su medicamento, por favor permtanos de 1 a 2 das hbiles para completar este proceso.  Los precios de los medicamentos varan con frecuencia dependiendo del Environmental consultant de dnde se surte la receta y alguna farmacias pueden ofrecer precios ms baratos.  El sitio web www.goodrx.com tiene cupones para medicamentos de Airline pilot. Los precios aqu no tienen en cuenta lo que podra costar con la ayuda del seguro (puede ser ms barato con su seguro), pero el sitio web puede darle el precio si no utiliz Research scientist (physical sciences).  - Puede imprimir el cupn correspondiente y llevarlo con su receta a la farmacia.  - Tambin puede pasar por nuestra oficina durante el horario de atencin regular y Charity fundraiser una tarjeta de cupones de  GoodRx.  - Si necesita que su receta se enve electrnicamente a una farmacia diferente, informe a nuestra oficina a travs de MyChart de  [place] o por telfono llamando al [phone] y presione la opcin 4.

## 2022-04-29 ENCOUNTER — Telehealth: Payer: Self-pay

## 2022-04-29 NOTE — Telephone Encounter (Signed)
-----  Message from Ralene Bathe, MD sent at 04/28/2022  6:44 PM EST ----- Diagnosis Skin , right nose BASAL CELL CARCINOMA, NODULAR PATTERN  Cancer - BCC Already treated Recheck next visit

## 2022-04-29 NOTE — Telephone Encounter (Signed)
Advised patient of results/hd  

## 2022-05-05 ENCOUNTER — Encounter: Payer: Self-pay | Admitting: Dermatology

## 2022-05-11 ENCOUNTER — Other Ambulatory Visit: Payer: Self-pay | Admitting: *Deleted

## 2022-05-11 DIAGNOSIS — R972 Elevated prostate specific antigen [PSA]: Secondary | ICD-10-CM

## 2022-05-13 ENCOUNTER — Other Ambulatory Visit: Payer: Medicare PPO

## 2022-05-13 DIAGNOSIS — R972 Elevated prostate specific antigen [PSA]: Secondary | ICD-10-CM | POA: Diagnosis not present

## 2022-05-14 ENCOUNTER — Ambulatory Visit (INDEPENDENT_AMBULATORY_CARE_PROVIDER_SITE_OTHER): Payer: Medicare PPO | Admitting: Urology

## 2022-05-14 ENCOUNTER — Encounter: Payer: Self-pay | Admitting: Urology

## 2022-05-14 VITALS — BP 152/82 | HR 82 | Ht 64.0 in | Wt 156.0 lb

## 2022-05-14 DIAGNOSIS — N400 Enlarged prostate without lower urinary tract symptoms: Secondary | ICD-10-CM

## 2022-05-14 DIAGNOSIS — N5201 Erectile dysfunction due to arterial insufficiency: Secondary | ICD-10-CM

## 2022-05-14 DIAGNOSIS — R972 Elevated prostate specific antigen [PSA]: Secondary | ICD-10-CM | POA: Diagnosis not present

## 2022-05-14 DIAGNOSIS — N401 Enlarged prostate with lower urinary tract symptoms: Secondary | ICD-10-CM

## 2022-05-14 LAB — PSA: Prostate Specific Ag, Serum: 4.3 ng/mL — ABNORMAL HIGH (ref 0.0–4.0)

## 2022-05-14 MED ORDER — SILDENAFIL CITRATE 100 MG PO TABS: ORAL_TABLET | ORAL | 1 refills | Status: DC

## 2022-05-14 NOTE — Progress Notes (Unsigned)
05/14/2022 11:46 AM   Bruce Gomez 09-24-1945 [id number]  Referring provider: Cletis Athens, MD [address],  [place] [address]  Chief Complaint  Patient presents with   Benign Prostatic Hypertrophy    Urologic History:   1.  Elevated PSA-previously followed in [place].  Four prior prostate biopsies since the early 2000 with benign pathology and a baseline PSA between 11-13.   2.  BPH-urinary retention in 2014 and status post TURP by Dr. Robby Sermon with an incomplete resection.  He had resection of a large intravesical median lobe with 26 g resected.   3.  Erectile dysfunction.-On sildenafil  HPI: 77 y.o. male presents for annual follow-up.  Doing well since last visit No bothersome LUTS Denies dysuria, gross hematuria Denies flank, abdominal or pelvic pain Remains on finasteride PSA 05/13/2022 was 4.3 (uncorrected)   PMH: Past Medical History:  Diagnosis Date   Actinic keratosis    Anemia    Aortic sclerosis 04/21/2017   Overview:  Moderate Aortic Sclerosis, mild LVH,  mild Mitral valve thickening, Slight LAE on ECHO - started  on ACE  for  LVH   Arthritis    hips and lower back   Basal cell carcinoma 04/26/2022   Right nose - EDC   BPH with obstruction/lower urinary tract symptoms 03/19/2014   BPH with obstruction/lower urinary tract symptoms 03/19/2014   CAD (coronary artery disease) 04/21/2017   Overview:   silent MI - Inferior; s/p CABG x 3; Stress Echo  6/05; 8/08;  2/12   Chronic prostatitis 11/03/2000   Coronary artery disease    Elevated PSA 03/19/2014   Erectile dysfunction 03/19/2014   History of basal cell carcinoma 07/02/2015   right nose   History of dysplastic nevus 06/22/2017   left ant deltoid   Hyperlipidemia 04/21/2017   Hypertension    Impaired glucose tolerance 04/21/2017   Myocardial infarction ([place]) 2002   OSA (obstructive sleep apnea) 04/21/2017   Overview:  rx with wt loss, side sleeping-no CPAP, not true sleep  apnea   RLS (restless legs syndrome) 04/21/2017    Surgical History: Past Surgical History:  Procedure Laterality Date   ADENOIDECTOMY     CARDIAC SURGERY     cabg 2002   COLONOSCOPY WITH PROPOFOL N/A 12/14/2019   Procedure: COLONOSCOPY WITH PROPOFOL;  Surgeon: Lucilla Lame, MD;  Location: [facility];  Service: Endoscopy;  Laterality: N/A;  priority 4   CORONARY ARTERY BYPASS GRAFT  2002   x3   INGUINAL HERNIA REPAIR     POLYPECTOMY  12/14/2019   Procedure: POLYPECTOMY;  Surgeon: Lucilla Lame, MD;  Location: [facility];  Service: Endoscopy;;   TRANSURETHRAL RESECTION OF PROSTATE     2014    Home Medications:  Allergies as of 05/14/2022       Reactions   Pollen Extract    seasonal   Sulfa Antibiotics Other (See Comments)   Dizziness   Sulfamethoxazole-trimethoprim    dizzy dizzy   Benadryl [diphenhydramine Hcl (sleep)] Anxiety        Medication List        Accurate as of May 14, 2022 11:46 AM. If you have any questions, ask your nurse or doctor.          ascorbic acid 500 MG tablet Commonly known as: VITAMIN C Take 500 mg by mouth.   aspirin EC 81 MG tablet Take 81 mg by mouth.   atorvastatin 10 MG tablet Commonly known as: LIPITOR TAKE 1 TABLET EVERY  DAY   B COMPLEX 1 PO Take by mouth. Takes 2 a week   calcium carbonate 1500 (600 Ca) MG Tabs tablet Commonly known as: OSCAL Take by mouth.   clonazePAM 0.5 MG tablet Commonly known as: KLONOPIN Take 1 tablet (0.5 mg total) by mouth at bedtime.   Coenzyme Q10 100 MG capsule Take 200 mg by mouth. Takes 2 times week   esomeprazole 40 MG capsule Commonly known as: NEXIUM TAKE 1 CAPSULE EVERY DAY What changed:  when to take this reasons to take this   ferrous sulfate 324 MG Tbec Take 324 mg by mouth daily.   finasteride 5 MG tablet Commonly known as: PROSCAR TAKE 1 TABLET (5 MG TOTAL) DAILY.   fluorouracil 5 % cream Commonly known as: EFUDEX Apply to affected areas on  his scalp twice a day x 7 days   folic acid A999333 MCG tablet Commonly known as: FOLVITE Take by mouth. 2 times a week   lisinopril 10 MG tablet Commonly known as: ZESTRIL TAKE 1 TABLET EVERY DAY   melatonin 3 MG Tabs tablet Take by mouth.   metoprolol succinate 25 MG 24 hr tablet Commonly known as: TOPROL-XL TAKE 1/2 TABLET TWICE DAILY   niacin 500 MG ER tablet Commonly known as: VITAMIN B3 TAKE 4 TABLETS AT BEDTIME   Omega-3 1000 MG Caps Take by mouth.   pseudoephedrine 30 MG tablet Commonly known as: SUDAFED Take by mouth.   sildenafil 100 MG tablet Commonly known as: VIAGRA TAKE ONE TABLET BY MOUTH ONE HOUR PRIOR TO INTERCOURSE AS NEEDED   Vitamin D3 50 MCG (2000 UT) capsule Take by mouth.        Allergies:  Allergies  Allergen Reactions   Pollen Extract     seasonal   Sulfa Antibiotics Other (See Comments)    Dizziness    Sulfamethoxazole-Trimethoprim     dizzy dizzy    Benadryl [Diphenhydramine Hcl (Sleep)] Anxiety    Family History: Family History  Problem Relation Age of Onset   Prostate cancer Paternal Uncle    Bladder Cancer Neg Hx    Kidney cancer Neg Hx     Social History:  reports that he has never smoked. He has never used smokeless tobacco. He reports current alcohol use of about 1.0 - 2.0 standard drink of alcohol per week. He reports that he does not use drugs.   Physical Exam: BP (!) 152/82   Pulse 82   Ht 5' 4"$  (1.626 m)   Wt 156 lb (70.8 kg)   BMI 26.78 kg/m   Constitutional:  Alert and oriented, No acute distress. HEENT: [place] AT, moist mucus membranes.  Trachea midline, no masses. Cardiovascular: No clubbing, cyanosis, or edema. Respiratory: Normal respiratory effort, no increased work of breathing. Psychiatric: Normal mood and affect.    Assessment & Plan:    1.  Elevated PSA Stable Follow-up 1 year with PSA  2.  BPH without LUTS Doing well since prior TURP Continue finasteride prophylactically-refilled  3.   Erectile dysfunction Sildenafil refilled   [name], MD  [facility] [address], [place],  [address] [phone]

## 2022-05-26 ENCOUNTER — Other Ambulatory Visit: Payer: Self-pay | Admitting: Urology

## 2022-05-26 DIAGNOSIS — R972 Elevated prostate specific antigen [PSA]: Secondary | ICD-10-CM

## 2022-05-26 DIAGNOSIS — N138 Other obstructive and reflux uropathy: Secondary | ICD-10-CM

## 2022-07-20 ENCOUNTER — Encounter: Payer: Self-pay | Admitting: Ophthalmology

## 2022-07-21 NOTE — Discharge Instructions (Signed)

## 2022-07-21 NOTE — Anesthesia Preprocedure Evaluation (Addendum)
Anesthesia Evaluation  Patient identified by MRN, date of birth, ID band Patient awake    Reviewed: Allergy & Precautions, H&P , NPO status , Patient's Chart, lab work & pertinent test results  Airway Mallampati: III  TM Distance: <3 FB Neck ROM: Full    Dental no notable dental hx.    Pulmonary sleep apnea   Has had cough due to seasonal allergies, sleeping elevated, states he thinks he can lie flat today for surgery. He will have one small ice chip to let melt in mouth to try to alleviate "dry throat"  Pulmonary exam normal breath sounds clear to auscultation       Cardiovascular hypertension, + CAD and + Past MI  Normal cardiovascular exam Rhythm:Regular Rate:Normal  CABG 2002   Neuro/Psych   Anxiety     negative neurological ROS  negative psych ROS   GI/Hepatic negative GI ROS, Neg liver ROS,,,  Endo/Other  negative endocrine ROS    Renal/GU negative Renal ROS  negative genitourinary   Musculoskeletal negative musculoskeletal ROS (+) Arthritis ,    Abdominal   Peds negative pediatric ROS (+)  Hematology  (+) Blood dyscrasia, anemia   Anesthesia Other Findings Coronary artery disease  Aortic sclerosis CAD (coronary artery disease) Impaired glucose tolerance BPH with obstruction/lower urinary tract symptoms  Chronic prostatitis Erectile dysfunction  Elevated PSA Hyperlipidemia  RLS (restless legs syndrome) History of dysplastic nevus  Hypertension Myocardial infarction  Arthritis OSA (obstructive sleep apnea)  BPH with obstruction/lower urinary tract symptoms Actinic keratosis  History of basal cell carcinoma Anemia Basal cell carcinoma    Reproductive/Obstetrics negative OB ROS                             Anesthesia Physical Anesthesia Plan  ASA: 3  Anesthesia Plan: MAC   Post-op Pain Management:    Induction: Intravenous  PONV Risk Score and Plan:   Airway  Management Planned: Natural Airway and Nasal Cannula  Additional Equipment:   Intra-op Plan:   Post-operative Plan:   Informed Consent: I have reviewed the patients History and Physical, chart, labs and discussed the procedure including the risks, benefits and alternatives for the proposed anesthesia with the patient or authorized representative who has indicated his/her understanding and acceptance.     Dental Advisory Given  Plan Discussed with: Anesthesiologist, CRNA and Surgeon  Anesthesia Plan Comments: (Patient consented for risks of anesthesia including but not limited to:  - adverse reactions to medications - damage to eyes, teeth, lips or other oral mucosa - nerve damage due to positioning  - sore throat or hoarseness - Damage to heart, brain, nerves, lungs, other parts of body or loss of life  Patient voiced understanding.)       Anesthesia Quick Evaluation

## 2022-07-26 ENCOUNTER — Ambulatory Visit: Payer: Medicare PPO | Admitting: Anesthesiology

## 2022-07-26 ENCOUNTER — Ambulatory Visit
Admission: RE | Admit: 2022-07-26 | Discharge: 2022-07-26 | Disposition: A | Discharge status: Home / Self Care | Payer: Medicare PPO | Source: Ambulatory Visit | Attending: Ophthalmology | Admitting: Ophthalmology

## 2022-07-26 ENCOUNTER — Encounter
Admission: RE | Disposition: A | Discharge status: Home / Self Care | Payer: Self-pay | Source: Ambulatory Visit | Attending: Ophthalmology

## 2022-07-26 ENCOUNTER — Encounter: Payer: Self-pay | Admitting: Ophthalmology

## 2022-07-26 ENCOUNTER — Other Ambulatory Visit: Payer: Self-pay

## 2022-07-26 DIAGNOSIS — D649 Anemia, unspecified: Secondary | ICD-10-CM | POA: Diagnosis not present

## 2022-07-26 DIAGNOSIS — Z951 Presence of aortocoronary bypass graft: Secondary | ICD-10-CM | POA: Insufficient documentation

## 2022-07-26 DIAGNOSIS — I10 Essential (primary) hypertension: Secondary | ICD-10-CM | POA: Insufficient documentation

## 2022-07-26 DIAGNOSIS — M1990 Unspecified osteoarthritis, unspecified site: Secondary | ICD-10-CM | POA: Diagnosis not present

## 2022-07-26 DIAGNOSIS — I252 Old myocardial infarction: Secondary | ICD-10-CM | POA: Diagnosis not present

## 2022-07-26 DIAGNOSIS — I2510 Atherosclerotic heart disease of native coronary artery without angina pectoris: Secondary | ICD-10-CM | POA: Insufficient documentation

## 2022-07-26 DIAGNOSIS — F419 Anxiety disorder, unspecified: Secondary | ICD-10-CM | POA: Diagnosis not present

## 2022-07-26 DIAGNOSIS — H2511 Age-related nuclear cataract, right eye: Secondary | ICD-10-CM | POA: Diagnosis not present

## 2022-07-26 DIAGNOSIS — G4733 Obstructive sleep apnea (adult) (pediatric): Secondary | ICD-10-CM | POA: Diagnosis not present

## 2022-07-26 HISTORY — PX: CATARACT EXTRACTION W/PHACO: SHX586

## 2022-07-26 SURGERY — PHACOEMULSIFICATION, CATARACT, WITH IOL INSERTION
Anesthesia: Monitor Anesthesia Care | Site: Eye | Laterality: Right

## 2022-07-26 MED ORDER — MOXIFLOXACIN HCL 0.5 % OP SOLN
OPHTHALMIC | Status: DC | PRN
  Administered 2022-07-26: .2 mL via OPHTHALMIC

## 2022-07-26 MED ORDER — SIGHTPATH DOSE#1 BSS IO SOLN
INTRAOCULAR | Status: DC | PRN
  Administered 2022-07-26: 15 mL

## 2022-07-26 MED ORDER — LACTATED RINGERS IV SOLN: INTRAVENOUS | Status: DC

## 2022-07-26 MED ORDER — SIGHTPATH DOSE#1 BSS IO SOLN
INTRAOCULAR | Status: DC | PRN
  Administered 2022-07-26: 94 mL via OPHTHALMIC

## 2022-07-26 MED ORDER — FENTANYL CITRATE (PF) 100 MCG/2ML IJ SOLN
INTRAMUSCULAR | Status: DC | PRN
  Administered 2022-07-26 (×2): 50 ug via INTRAVENOUS

## 2022-07-26 MED ORDER — SIGHTPATH DOSE#1 NA HYALUR & NA CHOND-NA HYALUR IO KIT
PACK | INTRAOCULAR | Status: DC | PRN
  Administered 2022-07-26: 1 via OPHTHALMIC

## 2022-07-26 MED ORDER — TETRACAINE HCL 0.5 % OP SOLN
1.0000 [drp] | OPHTHALMIC | Status: DC | PRN
  Administered 2022-07-26 (×3): 1 [drp] via OPHTHALMIC

## 2022-07-26 MED ORDER — LIDOCAINE HCL (PF) 2 % IJ SOLN
INTRAOCULAR | Status: DC | PRN
  Administered 2022-07-26: 1 mL via INTRAOCULAR

## 2022-07-26 MED ORDER — ARMC OPHTHALMIC DILATING DROPS
1.0000 | OPHTHALMIC | Status: DC | PRN
  Administered 2022-07-26 (×3): 1 via OPHTHALMIC

## 2022-07-26 MED ORDER — MIDAZOLAM HCL 2 MG/2ML IJ SOLN
INTRAMUSCULAR | Status: DC | PRN
  Administered 2022-07-26 (×2): 1 mg via INTRAVENOUS

## 2022-07-26 SURGICAL SUPPLY — 18 items
CANNULA ANT/CHMB 27G (MISCELLANEOUS) IMPLANT
CANNULA ANT/CHMB 27GA (MISCELLANEOUS) IMPLANT
CATARACT SUITE SIGHTPATH (MISCELLANEOUS) ×1 IMPLANT
DISSECTOR HYDRO NUCLEUS 50X22 (MISCELLANEOUS) ×1 IMPLANT
FEE CATARACT SUITE SIGHTPATH (MISCELLANEOUS) ×1 IMPLANT
GLOVE SURG GAMMEX PI TX LF 7.5 (GLOVE) ×1 IMPLANT
GLOVE SURG SYN 8.5  E (GLOVE) ×1
GLOVE SURG SYN 8.5 E (GLOVE) ×1 IMPLANT
GLOVE SURG SYN 8.5 PF PI (GLOVE) ×1 IMPLANT
LENS IOL TECNIS EYHANCE 17.5 (Intraocular Lens) IMPLANT
NDL FILTER BLUNT 18X1 1/2 (NEEDLE) ×1 IMPLANT
NEEDLE FILTER BLUNT 18X1 1/2 (NEEDLE) ×1 IMPLANT
PACK VIT ANT 23G (MISCELLANEOUS) IMPLANT
RING MALYGIN (MISCELLANEOUS) IMPLANT
SUT ETHILON 10-0 CS-B-6CS-B-6 (SUTURE)
SUTURE EHLN 10-0 CS-B-6CS-B-6 (SUTURE) IMPLANT
SYR 3ML LL SCALE MARK (SYRINGE) ×1 IMPLANT
SYR 5ML LL (SYRINGE) ×1 IMPLANT

## 2022-07-26 NOTE — Transfer of Care (Addendum)
Immediate Anesthesia Transfer of Care Note  Patient: Bruce Gomez  Procedure(s) Performed: CATARACT EXTRACTION PHACO AND INTRAOCULAR LENS PLACEMENT (IOC) RIGHT (Right: Eye)  Patient Location: PACU  Anesthesia Type: MAC  Level of Consciousness: awake, alert  and patient cooperative  Airway and Oxygen Therapy: Patient Spontanous Breathing and Patient connected to supplemental oxygen  Post-op Assessment: Post-op Vital signs reviewed, Patient's Cardiovascular Status Stable, Respiratory Function Stable, Patent Airway and No signs of Nausea or vomiting  Post-op Vital Signs: Reviewed and stable  Complications: No notable events documented.

## 2022-07-26 NOTE — Anesthesia Postprocedure Evaluation (Signed)
Anesthesia Post Note  Patient: Bruce Gomez  Procedure(s) Performed: CATARACT EXTRACTION PHACO AND INTRAOCULAR LENS PLACEMENT (IOC) RIGHT (Right: Eye)  Patient location during evaluation: PACU Anesthesia Type: MAC Level of consciousness: awake and alert Pain management: pain level controlled Vital Signs Assessment: post-procedure vital signs reviewed and stable Respiratory status: spontaneous breathing, nonlabored ventilation, respiratory function stable and patient connected to nasal cannula oxygen Cardiovascular status: stable and blood pressure returned to baseline Postop Assessment: no apparent nausea or vomiting Anesthetic complications: no   No notable events documented.   Last Vitals:  Vitals:   07/26/22 1104 07/26/22 1108  BP: 107/69 107/69  Pulse: 67 67  Resp: 14 12  Temp: (!) 36.3 C (!) 36.3 C  SpO2: 99% 96%    Last Pain:  Vitals:   07/26/22 1108  TempSrc:   PainSc: 0-No pain                 Da Authement C Maguire Sime

## 2022-07-26 NOTE — Op Note (Signed)
OPERATIVE NOTE  Mitsuo Budnick [id number] 07/26/2022   PREOPERATIVE DIAGNOSIS:  Nuclear sclerotic cataract right eye.  H25.11   POSTOPERATIVE DIAGNOSIS:    Nuclear sclerotic cataract right eye.     PROCEDURE:  Phacoemusification with posterior chamber intraocular lens placement of the right eye   LENS:   Implant Name Type Inv. Item Serial No. Manufacturer Lot No. LRB No. Used Action  LENS IOL TECNIS EYHANCE 17.5 - [id number] Intraocular Lens LENS IOL TECNIS EYHANCE 17.5 [id number] [employer]  Right 1 Implanted       Procedure(s) with comments: CATARACT EXTRACTION PHACO AND INTRAOCULAR LENS PLACEMENT (IOC) RIGHT (Right) - 9.76 0:59.2  DIB00 +17.5   ULTRASOUND TIME: 0 minutes 59 seconds.  CDE 9.76   SURGEON:  Willey Blade, MD, MPH  ANESTHESIOLOGIST: Anesthesiologist: Marisue Humble, MD CRNA: Barbette Hair, CRNA   ANESTHESIA:  Topical with tetracaine drops augmented with 1% preservative-free intracameral lidocaine.  ESTIMATED BLOOD LOSS: less than 1 mL.   COMPLICATIONS:  None.   DESCRIPTION OF PROCEDURE:  The patient was identified in the holding room and transported to the operating room and placed in the supine position under the operating microscope.  The right eye was identified as the operative eye and it was prepped and draped in the usual sterile ophthalmic fashion.   A 1.0 millimeter clear-corneal paracentesis was made at the 10:30 position. 0.5 ml of preservative-free 1% lidocaine with epinephrine was injected into the anterior chamber.  The anterior chamber was filled with viscoelastic.  A 2.4 millimeter keratome was used to make a near-clear corneal incision at the 8:00 position.  A curvilinear capsulorrhexis was made with a cystotome and capsulorrhexis forceps.  Balanced salt solution was used to hydrodissect and hydrodelineate the nucleus.   Phacoemulsification was then used in stop and chop fashion to remove the lens nucleus and epinucleus.  The remaining cortex was  then removed using the irrigation and aspiration handpiece. Viscoelastic was then placed into the capsular bag to distend it for lens placement.  A lens was then injected into the capsular bag.  The remaining viscoelastic was aspirated.   Wounds were hydrated with balanced salt solution.  The anterior chamber was inflated to a physiologic pressure with balanced salt solution.   Intracameral vigamox 0.1 mL undiluted was injected into the eye and a drop placed onto the ocular surface.  No wound leaks were noted.  The patient was taken to the recovery room in stable condition without complications of anesthesia or surgery  Willey Blade 07/26/2022, 11:03 AM

## 2022-07-26 NOTE — H&P (Signed)
[facility]   Primary Care Physician:  Corky Downs, MD Ophthalmologist: Dr. Willey Blade  Pre-Procedure History & Physical: HPI:  Bruce Gomez is a 77 y.o. male here for cataract surgery.   Past Medical History:  Diagnosis Date   Actinic keratosis    Anemia    Aortic sclerosis 04/21/2017   Overview:  Moderate Aortic Sclerosis, mild LVH,  mild Mitral valve thickening, Slight LAE on ECHO - started  on ACE  for  LVH   Arthritis    hips and lower back   Basal cell carcinoma 04/26/2022   Right nose - EDC   BPH with obstruction/lower urinary tract symptoms 03/19/2014   BPH with obstruction/lower urinary tract symptoms 03/19/2014   CAD (coronary artery disease) 04/21/2017   Overview:   silent MI - Inferior; s/p CABG x 3; Stress Echo  6/05; 8/08;  2/12   Chronic prostatitis 11/03/2000   Coronary artery disease    Elevated PSA 03/19/2014   Erectile dysfunction 03/19/2014   History of basal cell carcinoma 07/02/2015   right nose   History of dysplastic nevus 06/22/2017   left ant deltoid   Hyperlipidemia 04/21/2017   Hypertension    Impaired glucose tolerance 04/21/2017   Myocardial infarction 2002   OSA (obstructive sleep apnea) 04/21/2017   Overview:  rx with wt loss, side sleeping-no CPAP, not true sleep apnea   RLS (restless legs syndrome) 04/21/2017    Past Surgical History:  Procedure Laterality Date   ADENOIDECTOMY     CARDIAC SURGERY     cabg 2002   COLONOSCOPY WITH PROPOFOL N/A 12/14/2019   Procedure: COLONOSCOPY WITH PROPOFOL;  Surgeon: Midge Minium, MD;  Location: [facility];  Service: Endoscopy;  Laterality: N/A;  priority 4   CORONARY ARTERY BYPASS GRAFT  2002   x3   INGUINAL HERNIA REPAIR     POLYPECTOMY  12/14/2019   Procedure: POLYPECTOMY;  Surgeon: Midge Minium, MD;  Location[facility];  Service: Endoscopy;;   TRANSURETHRAL RESECTION OF PROSTATE     2014    Prior to Admission medications   Medication Sig Start Date End Date  Taking? Authorizing Provider  acetaminophen (TYLENOL) 500 MG tablet Take 500 mg by mouth every 6 (six) hours as needed.   Yes [provider]  aspirin EC 81 MG tablet Take 81 mg by mouth.   Yes [provider]  atorvastatin (LIPITOR) 10 MG tablet TAKE 1 TABLET EVERY DAY 12/10/21  Yes Kara Dies, NP  B Complex Vitamins (B COMPLEX 1 PO) Take by mouth. Takes 2 a week   Yes [provider]  calcium carbonate (OSCAL) 1500 (600 Ca) MG TABS tablet Take by mouth.   Yes [provider]  Cholecalciferol (VITAMIN D3) 2000 units capsule Take 2,000 Units by mouth every other day.   Yes [provider]  clonazePAM (KLONOPIN) 0.5 MG tablet Take 1 tablet (0.5 mg total) by mouth at bedtime. 02/08/22  Yes Masoud, Renda Rolls, MD  Coenzyme Q10 100 MG capsule Take 200 mg by mouth. Takes 2 times week   Yes [provider]  esomeprazole (NEXIUM) 40 MG capsule TAKE 1 CAPSULE EVERY DAY Patient taking differently: Take 40 mg by mouth as needed. 11/09/21  Yes Masoud, Renda Rolls, MD  ferrous sulfate 324 MG TBEC Take 324 mg by mouth daily.   Yes [provider]  finasteride (PROSCAR) 5 MG tablet TAKE 1 TABLET EVERY DAY 05/26/22  Yes Stoioff, Verna Czech, MD  fluorouracil (EFUDEX) 5 % cream  Apply to affected areas on his scalp twice a day x 7 days 04/26/22  Yes Deirdre Evener, MD  folic acid (FOLVITE) 400 MCG tablet Take 800 mcg by mouth. 2 times a week   Yes [provider]  lisinopril (ZESTRIL) 10 MG tablet TAKE 1 TABLET EVERY DAY 08/12/21  Yes Masoud, Renda Rolls, MD  loratadine (CLARITIN) 10 MG tablet Take 10 mg by mouth daily as needed for allergies.   Yes [provider]  metoprolol succinate (TOPROL-XL) 25 MG 24 hr tablet TAKE 1/2 TABLET TWICE DAILY 05/11/21  Yes Masoud, Renda Rolls, MD  niacin (NIASPAN) 500 MG CR tablet TAKE 4 TABLETS AT BEDTIME 04/23/21  Yes Masoud, Renda Rolls, MD  Omega-3 1000 MG CAPS Take 1,200 mg by mouth daily.   Yes [provider]   pseudoephedrine (SUDAFED) 30 MG tablet Take by mouth.   Yes [provider]  sildenafil (VIAGRA) 100 MG tablet TAKE ONE TABLET BY MOUTH ONE HOUR PRIOR TO INTERCOURSE AS NEEDED 05/14/22  Yes Stoioff, Verna Czech, MD  vitamin C (ASCORBIC ACID) 500 MG tablet Take 500 mg by mouth.   Yes [provider]  Melatonin 3 MG TABS Take by mouth.    [provider]    Allergies as of 07/07/2022 - Review Complete 05/14/2022  Allergen Reaction Noted   Pollen extract  03/19/2014   Sulfa antibiotics Other (See Comments) 01/15/2016   Sulfamethoxazole-trimethoprim  10/22/2010   Benadryl [diphenhydramine hcl (sleep)] Anxiety 01/15/2016    Family History  Problem Relation Age of Onset   Prostate cancer Paternal Uncle    Bladder Cancer Neg Hx    Kidney cancer Neg Hx     Social History   Socioeconomic History   Marital status: Married    Spouse name: Not on file   Number of children: Not on file   Years of education: Not on file   Highest education level: Not on file  Occupational History   Not on file  Tobacco Use   Smoking status: Never   Smokeless tobacco: Never  Vaping Use   Vaping Use: Never used  Substance and Sexual Activity   Alcohol use: Yes    Alcohol/week: 1.0 - 2.0 standard drink of alcohol    Types: 1 - 2 Glasses of wine per week    Comment: ocassional glass of wine   Drug use: No   Sexual activity: Yes    Birth control/protection: None  Other Topics Concern   Not on file  Social History Narrative   Not on file   Social Determinants of Health   Financial Resource Strain: Low Risk  (03/15/2022)   Overall Financial Resource Strain (CARDIA)    Difficulty of Paying Living Expenses: Not hard at all  Food Insecurity: No Food Insecurity (03/15/2022)   Hunger Vital Sign    Worried About Running Out of Food in the Last Year: Never true    Ran Out of Food in the Last Year: Never true  Transportation Needs: No Transportation Needs (03/15/2022)   PRAPARE -  Administrator, Civil Service (Medical): No    Lack of Transportation (Non-Medical): No  Physical Activity: Sufficiently Active (03/15/2022)   Exercise Vital Sign    Days of Exercise per Week: 4 days    Minutes of Exercise per Session: 40 min  Stress: No Stress Concern Present (03/15/2022)   [employer] of Occupational Health - Occupational Stress Questionnaire    Feeling of Stress : Not at all  Social Connections:  Moderately Integrated (03/15/2022)   Social Connection and Isolation Panel [NHANES]    Frequency of Communication with Friends and Family: More than three times a week    Frequency of Social Gatherings with Friends and Family: More than three times a week    Attends Religious Services: More than 4 times per year    Active Member of [employer] or Organizations: Not on file    Attends Banker Meetings: Never    Marital Status: Married  Catering manager Violence: Not At Risk (03/15/2022)   Humiliation, Afraid, Rape, and Kick questionnaire    Fear of Current or Ex-Partner: No    Emotionally Abused: No    Physically Abused: No    Sexually Abused: No    Review of Systems: See HPI, otherwise negative ROS  Physical Exam: BP (!) 153/85   Pulse 71   Temp (!) 97.2 F (36.2 C) (Temporal)   Resp 13   Ht 5' 4.02" (1.626 m)   Wt 73.5 kg   SpO2 100%   BMI 27.81 kg/m  General:   Alert, cooperative in NAD Head:  Normocephalic and atraumatic. Respiratory:  Normal work of breathing. Cardiovascular:  RRR  Impression/Plan: Bruce Gomez is here for cataract surgery.  Risks, benefits, limitations, and alternatives regarding cataract surgery have been reviewed with the patient.  Questions have been answered.  All parties agreeable.   Willey Blade, MD  07/26/2022, 10:35 AM

## 2022-07-27 ENCOUNTER — Encounter: Payer: Self-pay | Admitting: Ophthalmology

## 2022-07-29 ENCOUNTER — Ambulatory Visit (INDEPENDENT_AMBULATORY_CARE_PROVIDER_SITE_OTHER): Payer: Medicare PPO | Admitting: Dermatology

## 2022-07-29 ENCOUNTER — Encounter: Payer: Self-pay | Admitting: Dermatology

## 2022-07-29 VITALS — BP 142/74 | HR 85

## 2022-07-29 DIAGNOSIS — L578 Other skin changes due to chronic exposure to nonionizing radiation: Secondary | ICD-10-CM

## 2022-07-29 DIAGNOSIS — Z85828 Personal history of other malignant neoplasm of skin: Secondary | ICD-10-CM | POA: Diagnosis not present

## 2022-07-29 DIAGNOSIS — L820 Inflamed seborrheic keratosis: Secondary | ICD-10-CM

## 2022-07-29 DIAGNOSIS — L570 Actinic keratosis: Secondary | ICD-10-CM | POA: Diagnosis not present

## 2022-07-29 DIAGNOSIS — L821 Other seborrheic keratosis: Secondary | ICD-10-CM | POA: Diagnosis not present

## 2022-07-29 DIAGNOSIS — D692 Other nonthrombocytopenic purpura: Secondary | ICD-10-CM

## 2022-07-29 NOTE — Patient Instructions (Addendum)
Cryotherapy Aftercare  Wash gently with soap and water everyday.   Apply Vaseline Jelly daily until healed.    Recommend daily broad spectrum sunscreen SPF 30+ to sun-exposed areas, reapply every 2 hours as needed. Call for new or changing lesions.  Staying in the shade or wearing long sleeves, sun glasses (UVA+UVB protection) and wide brim hats (4-inch brim around the entire circumference of the hat) are also recommended for sun protection.     Due to recent changes in healthcare laws, you may see results of your pathology and/or laboratory studies on MyChart before the doctors have had a chance to review them. We understand that in some cases there may be results that are confusing or concerning to you. Please understand that not all results are received at the same time and often the doctors may need to interpret multiple results in order to provide you with the best plan of care or course of treatment. Therefore, we ask that you please give us 2 business days to thoroughly review all your results before contacting the office for clarification. Should we see a critical lab result, you will be contacted sooner.   If You Need Anything After Your Visit  If you have any questions or concerns for your doctor, please call our main line at [phone] and press option 4 to reach your doctor's medical assistant. If no one answers, please leave a voicemail as directed and we will return your call as soon as possible. Messages left after 4 pm will be answered the following business day.   You may also send us a message via MyChart. We typically respond to MyChart messages within 1-2 business days.  For prescription refills, please ask your pharmacy to contact our office. Our fax number is [phone].  If you have an urgent issue when the clinic is closed that cannot wait until the next business day, you can page your doctor at the number below.    Please note that while we do our best to be  available for urgent issues outside of office hours, we are not available 24/7.   If you have an urgent issue and are unable to reach us, you may choose to seek medical care at your doctor's office, retail clinic, urgent care center, or emergency room.  If you have a medical emergency, please immediately call 911 or go to the emergency department.  Pager Numbers  - Dr. Kowalski: [phone]  - Dr. Moye: [phone]  - Dr. Stewart: [phone]  In the event of inclement weather, please call our main line at [phone] for an update on the status of any delays or closures.  Dermatology Medication Tips: Please keep the boxes that topical medications come in in order to help keep track of the instructions about where and how to use these. Pharmacies typically print the medication instructions only on the boxes and not directly on the medication tubes.   If your medication is too expensive, please contact our office at [phone] option 4 or send us a message through MyChart.   We are unable to tell what your co-pay for medications will be in advance as this is different depending on your insurance coverage. However, we may be able to find a substitute medication at lower cost or fill out paperwork to get insurance to cover a needed medication.   If a prior authorization is required to get your medication covered by your insurance company, please allow us 1-2 business days to complete this process.    Drug prices often vary depending on where the prescription is filled and some pharmacies may offer cheaper prices.  The website www.goodrx.com contains coupons for medications through different pharmacies. The prices here do not account for what the cost may be with help from insurance (it may be cheaper with your insurance), but the website can give you the price if you did not use any insurance.  - You can print the associated coupon and take it with your prescription to the pharmacy.  -  You may also stop by our office during regular business hours and pick up a GoodRx coupon card.  - If you need your prescription sent electronically to a different pharmacy, notify our office through [facility] MyChart or by phone at [phone] option 4.     Si Usted Necesita Algo Despus de Su Visita  Tambin puede enviarnos un mensaje a travs de MyChart. Por lo general respondemos a los mensajes de MyChart en el transcurso de 1 a 2 das hbiles.  Para renovar recetas, por favor pida a su farmacia que se ponga en contacto con nuestra oficina. Nuestro nmero de fax es el [phone].  Si tiene un asunto urgente cuando la clnica est cerrada y que no puede esperar hasta el siguiente da hbil, puede llamar/localizar a su doctor(a) al nmero que aparece a continuacin.   Por favor, tenga en cuenta que aunque hacemos todo lo posible para estar disponibles para asuntos urgentes fuera del horario de oficina, no estamos disponibles las 24 horas del da, los 7 das de la semana.   Si tiene un problema urgente y no puede comunicarse con nosotros, puede optar por buscar atencin mdica  en el consultorio de su doctor(a), en una clnica privada, en un centro de atencin urgente o en una sala de emergencias.  Si tiene una emergencia mdica, por favor llame inmediatamente al 911 o vaya a la sala de emergencias.  Nmeros de bper  - Dr. Kowalski: [phone]  - Dra. Moye: [phone]  - Dra. Stewart: [phone]  En caso de inclemencias del tiempo, por favor llame a nuestra lnea principal al [phone] para una actualizacin sobre el estado de cualquier retraso o cierre.  Consejos para la medicacin en dermatologa: Por favor, guarde las cajas en las que vienen los medicamentos de uso tpico para ayudarle a seguir las instrucciones sobre dnde y cmo usarlos. Las farmacias generalmente imprimen las instrucciones del medicamento slo en las cajas y no directamente en los tubos del  medicamento.   Si su medicamento es muy caro, por favor, pngase en contacto con nuestra oficina llamando al [phone] y presione la opcin 4 o envenos un mensaje a travs de MyChart.   No podemos decirle cul ser su copago por los medicamentos por adelantado ya que esto es diferente dependiendo de la cobertura de su seguro. Sin embargo, es posible que podamos encontrar un medicamento sustituto a menor costo o llenar un formulario para que el seguro cubra el medicamento que se considera necesario.   Si se requiere una autorizacin previa para que su compaa de seguros cubra su medicamento, por favor permtanos de 1 a 2 das hbiles para completar este proceso.  Los precios de los medicamentos varan con frecuencia dependiendo del lugar de dnde se surte la receta y alguna farmacias pueden ofrecer precios ms baratos.  El sitio web www.goodrx.com tiene cupones para medicamentos de diferentes farmacias. Los precios aqu no tienen en cuenta lo que podra costar con la ayuda del seguro (puede ser ms   barato con su seguro), pero el sitio web puede darle el precio si no utiliz ningn seguro.  - Puede imprimir el cupn correspondiente y llevarlo con su receta a la farmacia.  - Tambin puede pasar por nuestra oficina durante el horario de atencin regular y recoger una tarjeta de cupones de GoodRx.  - Si necesita que su receta se enve electrnicamente a una farmacia diferente, informe a nuestra oficina a travs de MyChart de [place] o por telfono llamando al [phone] y presione la opcin 4.  

## 2022-07-29 NOTE — Progress Notes (Signed)
Follow-Up Visit   Subjective  Bruce Gomez is a 77 y.o. male who presents for the following: 3 month AK recheck. Scalp. Tx with LN2 and 5FU/Calcipotriene.  3 month recheck BCC. Right nose.   The patient has spots, moles and lesions to be evaluated, some may be new or changing and the patient has concerns that these could be cancer.  Patient accompanied by wife who contributes to history.  The following portions of the chart were reviewed this encounter and updated as appropriate: medications, allergies, medical history  Review of Systems:  No other skin or systemic complaints except as noted in HPI or Assessment and Plan.  Objective  Well appearing patient in no apparent distress; mood and affect are within normal limits.  A focused examination was performed of the following areas: Scalp, face, arms, hands Relevant physical exam findings are noted in the Assessment and Plan.    Assessment & Plan   HISTORY OF BASAL CELL CARCINOMA OF THE SKIN. Right nose, EDC 04/26/2022 - No evidence of recurrence today - Recommend regular full body skin exams - Recommend daily broad spectrum sunscreen SPF 30+ to sun-exposed areas, reapply every 2 hours as needed.  - Call if any new or changing lesions are noted between office visits   ACTINIC DAMAGE - chronic, secondary to cumulative UV radiation exposure/sun exposure over time - diffuse scaly erythematous macules with underlying dyspigmentation - Recommend daily broad spectrum sunscreen SPF 30+ to sun-exposed areas, reapply every 2 hours as needed.  - Recommend staying in the shade or wearing long sleeves, sun glasses (UVA+UVB protection) and wide brim hats (4-inch brim around the entire circumference of the hat). - Call for new or changing lesions.  Purpura - Chronic; persistent and recurrent.  Treatable, but not curable. - Violaceous macules and patches - Benign - Related to trauma, age, sun damage and/or use of blood thinners, chronic  use of topical and/or oral steroids - Observe - Can use OTC arnica containing moisturizer such as Dermend Bruise Formula if desired - Call for worsening or other concerns  ACTINIC KERATOSIS Exam: Erythematous thin papules/macules with gritty scale  Actinic keratoses are precancerous spots that appear secondary to cumulative UV radiation exposure/sun exposure over time. They are chronic with expected duration over 1 year. A portion of actinic keratoses will progress to squamous cell carcinoma of the skin. It is not possible to reliably predict which spots will progress to skin cancer and so treatment is recommended to prevent development of skin cancer.  Recommend daily broad spectrum sunscreen SPF 30+ to sun-exposed areas, reapply every 2 hours as needed.  Recommend staying in the shade or wearing long sleeves, sun glasses (UVA+UVB protection) and wide brim hats (4-inch brim around the entire circumference of the hat). Call for new or changing lesions.  Treatment Plan:  Prior to procedure, discussed risks of blister formation, small wound, skin dyspigmentation, or rare scar following cryotherapy. Recommend Vaseline ointment to treated areas while healing.  Destruction Procedure Note Destruction method: cryotherapy   Informed consent: discussed and consent obtained   Lesion destroyed using liquid nitrogen: Yes   Outcome: patient tolerated procedure well with no complications   Post-procedure details: wound care instructions given   Locations: right hand x1 # of Lesions Treated: 1   INFLAMED SEBORRHEIC KERATOSIS Exam: Erythematous keratotic or waxy stuck-on papule or plaque.  Symptomatic, irritating, patient would like treated.  Benign-appearing.  Call clinic for new or changing lesions.   Prior to procedure, discussed risks of  blister formation, small wound, skin dyspigmentation, or rare scar following treatment. Recommend Vaseline ointment to treated areas while  healing.  Destruction Procedure Note Destruction method: cryotherapy   Informed consent: discussed and consent obtained   Lesion destroyed using liquid nitrogen: Yes   Outcome: patient tolerated procedure well with no complications   Post-procedure details: wound care instructions given   Locations: right forehead x1 # of Lesions Treated: 1  STUCCO KERATOSIS - Stuck-on, white rough papules at dorsal feet  - Benign-appearing - Discussed benign etiology and prognosis. - Observe - Call for any changes    Return in about 6 months (around 01/28/2023) for TBSE, HxBCC, HxAK.  I, Lawson Radar, CMA, am acting as scribe for Armida Sans, MD.   Documentation: I have reviewed the above documentation for accuracy and completeness, and I agree with the above.  Armida Sans, MD

## 2022-08-02 NOTE — Anesthesia Preprocedure Evaluation (Addendum)
Anesthesia Evaluation  Patient identified by MRN, date of birth, ID band Patient awake    Reviewed: Allergy & Precautions, H&P , NPO status , Patient's Chart, lab work & pertinent test results  Airway Mallampati: III  TM Distance: >3 FB Neck ROM: Full    Dental no notable dental hx.    Pulmonary sleep apnea    Pulmonary exam normal breath sounds clear to auscultation       Cardiovascular hypertension, + CAD and + Past MI  Normal cardiovascular exam Rhythm:Regular Rate:Normal     Neuro/Psych negative neurological ROS  negative psych ROS   GI/Hepatic negative GI ROS, Neg liver ROS,,,  Endo/Other  negative endocrine ROS    Renal/GU negative Renal ROS  negative genitourinary   Musculoskeletal  (+) Arthritis ,    Abdominal   Peds negative pediatric ROS (+)  Hematology  (+) Blood dyscrasia, anemia   Anesthesia Other Findings Coronary artery disease  Aortic sclerosis CAD (coronary artery disease) Impaired glucose tolerance BPH with obstruction/lower urinary tract symptoms Chronic prostatitis Erectile dysfunction  Elevated PSA Hyperlipidemia  RLS (restless legs syndrome) History of dysplastic nevus  Hypertension Myocardial infarction (HCC)  Arthritis OSA (obstructive sleep apnea)  BPH with obstruction/lower urinary tract symptoms Actinic keratosis  History of basal cell carcinoma Anemia    Reproductive/Obstetrics negative OB ROS                              Anesthesia Physical Anesthesia Plan  ASA: 3  Anesthesia Plan: MAC   Post-op Pain Management:    Induction: Intravenous  PONV Risk Score and Plan:   Airway Management Planned: Natural Airway and Nasal Cannula  Additional Equipment:   Intra-op Plan:   Post-operative Plan: Extubation in OR  Informed Consent: I have reviewed the patients History and Physical, chart, labs and discussed the procedure including the  risks, benefits and alternatives for the proposed anesthesia with the patient or authorized representative who has indicated his/her understanding and acceptance.     Dental Advisory Given  Plan Discussed with: Anesthesiologist, CRNA and Surgeon  Anesthesia Plan Comments: (Patient consented for risks of anesthesia including but not limited to:  - adverse reactions to medications - damage to eyes, teeth, lips or other oral mucosa - nerve damage due to positioning  - sore throat or hoarseness - Damage to heart, brain, nerves, lungs, other parts of body or loss of life  Patient voiced understanding.)         Anesthesia Quick Evaluation

## 2022-08-04 NOTE — Discharge Instructions (Signed)

## 2022-08-06 ENCOUNTER — Encounter: Payer: Self-pay | Admitting: Dermatology

## 2022-08-09 ENCOUNTER — Ambulatory Visit
Admission: RE | Admit: 2022-08-09 | Discharge: 2022-08-09 | Disposition: A | Discharge status: Home / Self Care | Payer: Medicare PPO | Source: Ambulatory Visit | Attending: Ophthalmology | Admitting: Ophthalmology

## 2022-08-09 ENCOUNTER — Other Ambulatory Visit: Payer: Self-pay

## 2022-08-09 ENCOUNTER — Ambulatory Visit: Payer: Medicare PPO | Admitting: Anesthesiology

## 2022-08-09 ENCOUNTER — Encounter
Admission: RE | Disposition: A | Discharge status: Home / Self Care | Payer: Self-pay | Source: Ambulatory Visit | Attending: Ophthalmology

## 2022-08-09 ENCOUNTER — Encounter: Payer: Self-pay | Admitting: Ophthalmology

## 2022-08-09 DIAGNOSIS — I2510 Atherosclerotic heart disease of native coronary artery without angina pectoris: Secondary | ICD-10-CM | POA: Diagnosis not present

## 2022-08-09 DIAGNOSIS — G4733 Obstructive sleep apnea (adult) (pediatric): Secondary | ICD-10-CM | POA: Diagnosis not present

## 2022-08-09 DIAGNOSIS — H2512 Age-related nuclear cataract, left eye: Secondary | ICD-10-CM | POA: Diagnosis present

## 2022-08-09 DIAGNOSIS — I252 Old myocardial infarction: Secondary | ICD-10-CM | POA: Diagnosis not present

## 2022-08-09 DIAGNOSIS — E785 Hyperlipidemia, unspecified: Secondary | ICD-10-CM | POA: Diagnosis not present

## 2022-08-09 DIAGNOSIS — I10 Essential (primary) hypertension: Secondary | ICD-10-CM | POA: Insufficient documentation

## 2022-08-09 DIAGNOSIS — Z85828 Personal history of other malignant neoplasm of skin: Secondary | ICD-10-CM | POA: Diagnosis not present

## 2022-08-09 HISTORY — PX: CATARACT EXTRACTION W/PHACO: SHX586

## 2022-08-09 SURGERY — PHACOEMULSIFICATION, CATARACT, WITH IOL INSERTION
Anesthesia: Monitor Anesthesia Care | Site: Eye | Laterality: Left

## 2022-08-09 MED ORDER — SIGHTPATH DOSE#1 NA HYALUR & NA CHOND-NA HYALUR IO KIT
PACK | INTRAOCULAR | Status: DC | PRN
  Administered 2022-08-09: 1 via OPHTHALMIC

## 2022-08-09 MED ORDER — ARMC OPHTHALMIC DILATING DROPS
1.0000 | OPHTHALMIC | Status: DC | PRN
  Administered 2022-08-09 (×3): 1 via OPHTHALMIC

## 2022-08-09 MED ORDER — LACTATED RINGERS IV SOLN: INTRAVENOUS | Status: DC

## 2022-08-09 MED ORDER — FENTANYL CITRATE (PF) 100 MCG/2ML IJ SOLN
INTRAMUSCULAR | Status: DC | PRN
  Administered 2022-08-09: 50 ug via INTRAVENOUS

## 2022-08-09 MED ORDER — LIDOCAINE HCL (PF) 2 % IJ SOLN
INTRAOCULAR | Status: DC | PRN
  Administered 2022-08-09: 1 mL via INTRAOCULAR

## 2022-08-09 MED ORDER — SIGHTPATH DOSE#1 BSS IO SOLN
INTRAOCULAR | Status: DC | PRN
  Administered 2022-08-09: 80 mL via OPHTHALMIC

## 2022-08-09 MED ORDER — MIDAZOLAM HCL 2 MG/2ML IJ SOLN
INTRAMUSCULAR | Status: DC | PRN
  Administered 2022-08-09: 1 mg via INTRAVENOUS

## 2022-08-09 MED ORDER — TETRACAINE HCL 0.5 % OP SOLN
1.0000 [drp] | OPHTHALMIC | Status: DC | PRN
  Administered 2022-08-09 (×3): 1 [drp] via OPHTHALMIC

## 2022-08-09 MED ORDER — MOXIFLOXACIN HCL 0.5 % OP SOLN
OPHTHALMIC | Status: DC | PRN
  Administered 2022-08-09: .2 mL via OPHTHALMIC

## 2022-08-09 MED ORDER — SIGHTPATH DOSE#1 BSS IO SOLN
INTRAOCULAR | Status: DC | PRN
  Administered 2022-08-09: 30 mL

## 2022-08-09 SURGICAL SUPPLY — 18 items
CANNULA ANT/CHMB 27G (MISCELLANEOUS) IMPLANT
CANNULA ANT/CHMB 27GA (MISCELLANEOUS) IMPLANT
CATARACT SUITE SIGHTPATH (MISCELLANEOUS) ×1 IMPLANT
DISSECTOR HYDRO NUCLEUS 50X22 (MISCELLANEOUS) ×1 IMPLANT
FEE CATARACT SUITE SIGHTPATH (MISCELLANEOUS) ×1 IMPLANT
GLOVE SURG GAMMEX PI TX LF 7.5 (GLOVE) ×1 IMPLANT
GLOVE SURG SYN 8.5  E (GLOVE) ×1
GLOVE SURG SYN 8.5 E (GLOVE) ×1 IMPLANT
GLOVE SURG SYN 8.5 PF PI (GLOVE) ×1 IMPLANT
LENS IOL TECNIS EYHANCE 17.5 (Intraocular Lens) IMPLANT
NDL FILTER BLUNT 18X1 1/2 (NEEDLE) ×1 IMPLANT
NEEDLE FILTER BLUNT 18X1 1/2 (NEEDLE) ×1 IMPLANT
PACK VIT ANT 23G (MISCELLANEOUS) IMPLANT
RING MALYGIN (MISCELLANEOUS) IMPLANT
SUT ETHILON 10-0 CS-B-6CS-B-6 (SUTURE)
SUTURE EHLN 10-0 CS-B-6CS-B-6 (SUTURE) IMPLANT
SYR 3ML LL SCALE MARK (SYRINGE) ×1 IMPLANT
SYR 5ML LL (SYRINGE) ×1 IMPLANT

## 2022-08-09 NOTE — Transfer of Care (Signed)
Immediate Anesthesia Transfer of Care Note  Patient: Bruce Gomez  Procedure(s) Performed: CATARACT EXTRACTION PHACO AND INTRAOCULAR LENS PLACEMENT (IOC) LEFT (Left: Eye)  Patient Location: PACU  Anesthesia Type: MAC  Level of Consciousness: awake, alert  and patient cooperative  Airway and Oxygen Therapy: Patient Spontanous Breathing and Patient connected to supplemental oxygen  Post-op Assessment: Post-op Vital signs reviewed, Patient's Cardiovascular Status Stable, Respiratory Function Stable, Patent Airway and No signs of Nausea or vomiting  Post-op Vital Signs: Reviewed and stable  Complications: No notable events documented.

## 2022-08-09 NOTE — Anesthesia Postprocedure Evaluation (Signed)
Anesthesia Post Note  Patient: Bruce Gomez  Procedure(s) Performed: CATARACT EXTRACTION PHACO AND INTRAOCULAR LENS PLACEMENT (IOC) LEFT (Left: Eye)  Patient location during evaluation: PACU Anesthesia Type: MAC Level of consciousness: awake and alert Pain management: pain level controlled Vital Signs Assessment: post-procedure vital signs reviewed and stable Respiratory status: spontaneous breathing, nonlabored ventilation, respiratory function stable and patient connected to nasal cannula oxygen Cardiovascular status: stable and blood pressure returned to baseline Postop Assessment: no apparent nausea or vomiting Anesthetic complications: no   No notable events documented.   Last Vitals:  Vitals:   08/09/22 1022 08/09/22 1023  BP:  117/60  Pulse: 60 (!) 59  Resp: 14 14  Temp:    SpO2: 98% 96%    Last Pain:  Vitals:   08/09/22 1023  TempSrc:   PainSc: 0-No pain                 Jimeka Balan C Latisia Hilaire

## 2022-08-09 NOTE — Op Note (Signed)
OPERATIVE NOTE  Taurian Ley [id number] 08/09/2022   PREOPERATIVE DIAGNOSIS:  Nuclear sclerotic cataract left eye.  H25.12   POSTOPERATIVE DIAGNOSIS:    Nuclear sclerotic cataract left eye.     PROCEDURE:  Phacoemusification with posterior chamber intraocular lens placement of the left eye   LENS:   Implant Name Type Inv. Item Serial No. Manufacturer Lot No. LRB No. Used Action  LENS IOL TECNIS EYHANCE 17.5 - [id number] Intraocular Lens LENS IOL TECNIS EYHANCE 17.5 [id number] [employer]  Left 1 Implanted      Procedure(s) with comments: CATARACT EXTRACTION PHACO AND INTRAOCULAR LENS PLACEMENT (IOC) LEFT (Left) - 2.59  00:25.8  DIB00 +17.5   ULTRASOUND TIME: 0 minutes 25 seconds.  CDE 2.59   SURGEON:  Willey Blade, MD, MPH   ANESTHESIA:  Topical with tetracaine drops augmented with 1% preservative-free intracameral lidocaine.  ESTIMATED BLOOD LOSS: <1 mL   COMPLICATIONS:  None.   DESCRIPTION OF PROCEDURE:  The patient was identified in the holding room and transported to the operating room and placed in the supine position under the operating microscope.  The left eye was identified as the operative eye and it was prepped and draped in the usual sterile ophthalmic fashion.   A 1.0 millimeter clear-corneal paracentesis was made at the 5:00 position. 0.5 ml of preservative-free 1% lidocaine with epinephrine was injected into the anterior chamber.  The anterior chamber was filled with viscoelastic.  A 2.4 millimeter keratome was used to make a near-clear corneal incision at the 2:00 position.  A curvilinear capsulorrhexis was made with a cystotome and capsulorrhexis forceps.  Balanced salt solution was used to hydrodissect and hydrodelineate the nucleus.   Phacoemulsification was then used in stop and chop fashion to remove the lens nucleus and epinucleus.  The remaining cortex was then removed using the irrigation and aspiration handpiece. Viscoelastic was then placed into the  capsular bag to distend it for lens placement.  A lens was then injected into the capsular bag.  The remaining viscoelastic was aspirated.   Wounds were hydrated with balanced salt solution.  The anterior chamber was inflated to a physiologic pressure with balanced salt solution.  Intracameral vigamox 0.1 mL undiltued was injected into the eye and a drop placed onto the ocular surface.  No wound leaks were noted.  The patient was taken to the recovery room in stable condition without complications of anesthesia or surgery  Willey Blade 08/09/2022, 10:16 AM

## 2022-08-09 NOTE — H&P (Signed)
[facility]   Primary Care Physician:  Corky Downs, MD Ophthalmologist: Dr. Willey Blade  Pre-Procedure History & Physical: HPI:  Bruce Gomez is a 77 y.o. male here for cataract surgery.   Past Medical History:  Diagnosis Date   Actinic keratosis    Anemia    Aortic sclerosis 04/21/2017   Overview:  Moderate Aortic Sclerosis, mild LVH,  mild Mitral valve thickening, Slight LAE on ECHO - started  on ACE  for  LVH   Arthritis    hips and lower back   Basal cell carcinoma 04/26/2022   Right nose - [facility]   BPH with obstruction/lower urinary tract symptoms 03/19/2014   BPH with obstruction/lower urinary tract symptoms 03/19/2014   CAD (coronary artery disease) 04/21/2017   Overview:   silent MI - Inferior; s/p CABG x 3; Stress Echo  6/05; 8/08;  2/12   Chronic prostatitis 11/03/2000   Coronary artery disease    Elevated PSA 03/19/2014   Erectile dysfunction 03/19/2014   History of basal cell carcinoma 07/02/2015   right nose   History of dysplastic nevus 06/22/2017   left ant deltoid   Hyperlipidemia 04/21/2017   Hypertension    Impaired glucose tolerance 04/21/2017   Myocardial infarction (HCC) 2002   OSA (obstructive sleep apnea) 04/21/2017   Overview:  rx with wt loss, side sleeping-no CPAP, not true sleep apnea   RLS (restless legs syndrome) 04/21/2017    Past Surgical History:  Procedure Laterality Date   ADENOIDECTOMY     CARDIAC SURGERY     cabg 2002   CATARACT EXTRACTION W/PHACO Right 07/26/2022   Procedure: CATARACT EXTRACTION PHACO AND INTRAOCULAR LENS PLACEMENT (IOC) RIGHT;  Surgeon: Nevada Crane, MD;  Location: [facility];  Service: Ophthalmology;  Laterality: Right;  9.76 0:59.2   COLONOSCOPY WITH PROPOFOL N/A 12/14/2019   Procedure: COLONOSCOPY WITH PROPOFOL;  Surgeon: Midge Minium, MD;  Location: [facility];  Service: Endoscopy;  Laterality: N/A;  priority 4   CORONARY ARTERY BYPASS GRAFT  2002   x3   INGUINAL HERNIA REPAIR      POLYPECTOMY  12/14/2019   Procedure: POLYPECTOMY;  Surgeon: Midge Minium, MD;  Location: [facility];  Service: Endoscopy;;   TRANSURETHRAL RESECTION OF PROSTATE     2014    Prior to Admission medications   Medication Sig Start Date End Date Taking? Authorizing Provider  acetaminophen (TYLENOL) 500 MG tablet Take 500 mg by mouth every 6 (six) hours as needed.   Yes [provider]  aspirin EC 81 MG tablet Take 81 mg by mouth.   Yes [provider]  atorvastatin (LIPITOR) 10 MG tablet TAKE 1 TABLET EVERY DAY 12/10/21  Yes Kara Dies, NP  B Complex Vitamins (B COMPLEX 1 PO) Take by mouth. Takes 2 a week   Yes [provider]  calcium carbonate (OSCAL) 1500 (600 Ca) MG TABS tablet Take by mouth.   Yes [provider]  Cholecalciferol (VITAMIN D3) 2000 units capsule Take 2,000 Units by mouth every other day.   Yes [provider]  clonazePAM (KLONOPIN) 0.5 MG tablet Take 1 tablet (0.5 mg total) by mouth at bedtime. 02/08/22  Yes Masoud, Renda Rolls, MD  esomeprazole (NEXIUM) 40 MG capsule TAKE 1 CAPSULE EVERY DAY Patient taking differently: Take 40 mg by mouth as needed. 11/09/21  Yes Masoud, Renda Rolls, MD  ferrous sulfate 324 MG TBEC Take 324 mg by mouth daily.   Yes [provider]  finasteride (PROSCAR) 5 MG  tablet TAKE 1 TABLET EVERY DAY 05/26/22  Yes Stoioff, Verna Czech, MD  fluorouracil (EFUDEX) 5 % cream Apply to affected areas on his scalp twice a day x 7 days 04/26/22  Yes Deirdre Evener, MD  lisinopril (ZESTRIL) 10 MG tablet TAKE 1 TABLET EVERY DAY 08/12/21  Yes Masoud, Renda Rolls, MD  loratadine (CLARITIN) 10 MG tablet Take 10 mg by mouth daily as needed for allergies.   Yes [provider]  metoprolol succinate (TOPROL-XL) 25 MG 24 hr tablet TAKE 1/2 TABLET TWICE DAILY 05/11/21  Yes Masoud, Renda Rolls, MD  niacin (NIASPAN) 500 MG CR tablet TAKE 4 TABLETS AT BEDTIME 04/23/21  Yes Masoud, Renda Rolls, MD  Omega-3 1000 MG CAPS Take 1,200 mg by  mouth daily.   Yes [provider]  pseudoephedrine (SUDAFED) 30 MG tablet Take by mouth.   Yes [provider]  vitamin C (ASCORBIC ACID) 500 MG tablet Take 500 mg by mouth.   Yes [provider]  Coenzyme Q10 100 MG capsule Take 200 mg by mouth. Takes 2 times week    [provider]  folic acid (FOLVITE) 400 MCG tablet Take 800 mcg by mouth. 2 times a week    [provider]  Melatonin 3 MG TABS Take by mouth.    [provider]  sildenafil (VIAGRA) 100 MG tablet TAKE ONE TABLET BY MOUTH ONE HOUR PRIOR TO INTERCOURSE AS NEEDED 05/14/22   Riki Altes, MD    Allergies as of 07/07/2022 - Review Complete 05/14/2022  Allergen Reaction Noted   Pollen extract  03/19/2014   Sulfa antibiotics Other (See Comments) 01/15/2016   Sulfamethoxazole-trimethoprim  10/22/2010   Benadryl [diphenhydramine hcl (sleep)] Anxiety 01/15/2016    Family History  Problem Relation Age of Onset   Prostate cancer Paternal Uncle    Bladder Cancer Neg Hx    Kidney cancer Neg Hx     Social History   Socioeconomic History   Marital status: Married    Spouse name: Not on file   Number of children: Not on file   Years of education: Not on file   Highest education level: Not on file  Occupational History   Not on file  Tobacco Use   Smoking status: Never   Smokeless tobacco: Never  Vaping Use   Vaping Use: Never used  Substance and Sexual Activity   Alcohol use: Yes    Alcohol/week: 1.0 - 2.0 standard drink of alcohol    Types: 1 - 2 Glasses of wine per week    Comment: ocassional glass of wine   Drug use: No   Sexual activity: Yes    Birth control/protection: None  Other Topics Concern   Not on file  Social History Narrative   Not on file   Social Determinants of Health   Financial Resource Strain: Low Risk  (03/15/2022)   Overall Financial Resource Strain (CARDIA)    Difficulty of Paying Living Expenses: Not hard at all  Food Insecurity:  No Food Insecurity (03/15/2022)   Hunger Vital Sign    Worried About Running Out of Food in the Last Year: Never true    Ran Out of Food in the Last Year: Never true  Transportation Needs: No Transportation Needs (03/15/2022)   PRAPARE - Administrator, Civil Service (Medical): No    Lack of Transportation (Non-Medical): No  Physical Activity: Sufficiently Active (03/15/2022)   Exercise Vital Sign    Days of Exercise per Week: 4 days  Minutes of Exercise per Session: 40 min  Stress: No Stress Concern Present (03/15/2022)   [employer] of Occupational Health - Occupational Stress Questionnaire    Feeling of Stress : Not at all  Social Connections: Moderately Integrated (03/15/2022)   Social Connection and Isolation Panel [NHANES]    Frequency of Communication with Friends and Family: More than three times a week    Frequency of Social Gatherings with Friends and Family: More than three times a week    Attends Religious Services: More than 4 times per year    Active Member of [employer] or Organizations: Not on file    Attends Banker Meetings: Never    Marital Status: Married  Catering manager Violence: Not At Risk (03/15/2022)   Humiliation, Afraid, Rape, and Kick questionnaire    Fear of Current or Ex-Partner: No    Emotionally Abused: No    Physically Abused: No    Sexually Abused: No    Review of Systems: See HPI, otherwise negative ROS  Physical Exam: BP (!) 142/73   Pulse 72   Temp 98 F (36.7 C) (Temporal)   Resp 10   Ht 5' 4.02" (1.626 m)   Wt 73.3 kg   SpO2 94%   BMI 27.71 kg/m  General:   Alert, cooperative in NAD Head:  Normocephalic and atraumatic. Respiratory:  Normal work of breathing. Cardiovascular:  RRR  Impression/Plan: Bruce Gomez is here for cataract surgery.  Risks, benefits, limitations, and alternatives regarding cataract surgery have been reviewed with the patient.  Questions have been answered.  All parties  agreeable.   Willey Blade, MD  08/09/2022, 9:42 AM

## 2022-08-10 ENCOUNTER — Ambulatory Visit: Payer: Medicare PPO | Admitting: Internal Medicine

## 2022-08-10 ENCOUNTER — Encounter: Payer: Self-pay | Admitting: Ophthalmology

## 2022-08-10 VITALS — BP 130/80 | HR 73 | Ht 64.0 in | Wt 164.4 lb

## 2022-08-10 DIAGNOSIS — E782 Mixed hyperlipidemia: Secondary | ICD-10-CM

## 2022-08-10 DIAGNOSIS — I2510 Atherosclerotic heart disease of native coronary artery without angina pectoris: Secondary | ICD-10-CM | POA: Diagnosis not present

## 2022-08-10 DIAGNOSIS — M25562 Pain in left knee: Secondary | ICD-10-CM

## 2022-08-10 DIAGNOSIS — I10 Essential (primary) hypertension: Secondary | ICD-10-CM

## 2022-08-10 DIAGNOSIS — N138 Other obstructive and reflux uropathy: Secondary | ICD-10-CM

## 2022-08-10 DIAGNOSIS — N401 Enlarged prostate with lower urinary tract symptoms: Secondary | ICD-10-CM

## 2022-08-10 DIAGNOSIS — I2583 Coronary atherosclerosis due to lipid rich plaque: Secondary | ICD-10-CM

## 2022-08-10 DIAGNOSIS — G4733 Obstructive sleep apnea (adult) (pediatric): Secondary | ICD-10-CM | POA: Diagnosis not present

## 2022-08-10 MED ORDER — LISINOPRIL 10 MG PO TABS: 10.0000 mg | ORAL_TABLET | Freq: Every day | ORAL | 1 refills | Status: DC

## 2022-08-10 NOTE — Progress Notes (Signed)
Established Patient Office Visit  Subjective:  Patient ID: Bruce Gomez, male    DOB: 10-12-1945  Age: 77 y.o. MRN: [id number]  Chief Complaint  Patient presents with  . Establish Care    New Patient    Here to establish IM care. C/o left knee instability with several year h/o of pain. PMH of CAD, Prediabetes and HTN.    No other concerns at this time.   Past Medical History:  Diagnosis Date  . Actinic keratosis   . Anemia   . Aortic sclerosis 04/21/2017   Overview:  Moderate Aortic Sclerosis, mild LVH,  mild Mitral valve thickening, Slight LAE on ECHO - started  on ACE  for  LVH  . Arthritis    hips and lower back  . Basal cell carcinoma 04/26/2022   Right nose - [facility]  . BPH with obstruction/lower urinary tract symptoms 03/19/2014  . BPH with obstruction/lower urinary tract symptoms 03/19/2014  . CAD (coronary artery disease) 04/21/2017   Overview:   silent MI - Inferior; [place] Demedeiros/p CABG x 3; Stress Echo  6/05; 8/08;  2/12  . Chronic prostatitis 11/03/2000  . Coronary artery disease   . Elevated PSA 03/19/2014  . Erectile dysfunction 03/19/2014  . History of basal cell carcinoma 07/02/2015   right nose  . History of dysplastic nevus 06/22/2017   left ant deltoid  . Hyperlipidemia 04/21/2017  . Hypertension   . Impaired glucose tolerance 04/21/2017  . Myocardial infarction (HCC) 2002  . OSA (obstructive sleep apnea) 04/21/2017   Overview:  rx with wt loss, side sleeping-no CPAP, not true sleep apnea  . RLS (restless legs syndrome) 04/21/2017    Past Surgical History:  Procedure Laterality Date  . ADENOIDECTOMY    . CARDIAC SURGERY     cabg 2002  . CATARACT EXTRACTION W/PHACO Right 07/26/2022   Procedure: CATARACT EXTRACTION PHACO AND INTRAOCULAR LENS PLACEMENT (IOC) RIGHT;  Surgeon: Nevada Crane, MD;  Location: [facility];  Service: Ophthalmology;  Laterality: Right;  9.76 0:59.2  . CATARACT EXTRACTION W/PHACO Left 08/09/2022   Procedure: CATARACT  EXTRACTION PHACO AND INTRAOCULAR LENS PLACEMENT (IOC) LEFT;  Surgeon: Nevada Crane, MD;  Location: [facility];  Service: Ophthalmology;  Laterality: Left;  2.59  00:25.8  . COLONOSCOPY WITH PROPOFOL N/A 12/14/2019   Procedure: COLONOSCOPY WITH PROPOFOL;  Surgeon: Midge Minium, MD;  Location: [facility];  Service: Endoscopy;  Laterality: N/A;  priority 4  . CORONARY ARTERY BYPASS GRAFT  2002   x3  . INGUINAL HERNIA REPAIR    . POLYPECTOMY  12/14/2019   Procedure: POLYPECTOMY;  Surgeon: Midge Minium, MD;  Location: [facility];  Service: Endoscopy;;  . TRANSURETHRAL RESECTION OF PROSTATE     2014    Social History   Socioeconomic History  . Marital status: Married    Spouse name: Not on file  . Number of children: Not on file  . Years of education: Not on file  . Highest education level: Not on file  Occupational History  . Not on file  Tobacco Use  . Smoking status: Never  . Smokeless tobacco: Never  Vaping Use  . Vaping Use: Never used  Substance and Sexual Activity  . Alcohol use: Yes    Alcohol/week: 1.0 - 2.0 standard drink of alcohol    Types: 1 - 2 Glasses of wine per week    Comment: ocassional glass of wine  . Drug use: No  . Sexual activity: Yes  Birth control/protection: None  Other Topics Concern  . Not on file  Social History Narrative  . Not on file   Social Determinants of Health   Financial Resource Strain: Low Risk  (03/15/2022)   Overall Financial Resource Strain (CARDIA)   . Difficulty of Paying Living Expenses: Not hard at all  Food Insecurity: No Food Insecurity (03/15/2022)   Hunger Vital Sign   . Worried About Programme researcher, broadcasting/film/video in the Last Year: Never true   . Ran Out of Food in the Last Year: Never true  Transportation Needs: No Transportation Needs (03/15/2022)   PRAPARE - Transportation   . Lack of Transportation (Medical): No   . Lack of Transportation (Non-Medical): No  Physical Activity: Sufficiently  Active (03/15/2022)   Exercise Vital Sign   . Days of Exercise per Week: 4 days   . Minutes of Exercise per Session: 40 min  Stress: No Stress Concern Present (03/15/2022)   [employer] of Occupational Health - Occupational Stress Questionnaire   . Feeling of Stress : Not at all  Social Connections: Moderately Integrated (03/15/2022)   Social Connection and Isolation Panel [NHANES]   . Frequency of Communication with Friends and Family: More than three times a week   . Frequency of Social Gatherings with Friends and Family: More than three times a week   . Attends Religious Services: More than 4 times per year   . Active Member of Clubs or Organizations: Not on file   . Attends Banker Meetings: Never   . Marital Status: Married  Catering manager Violence: Not At Risk (03/15/2022)   Humiliation, Afraid, Rape, and Kick questionnaire   . Fear of Current or Ex-Partner: No   . Emotionally Abused: No   . Physically Abused: No   . Sexually Abused: No    Family History  Problem Relation Age of Onset  . Prostate cancer Paternal Uncle   . Bladder Cancer Neg Hx   . Kidney cancer Neg Hx     Allergies  Allergen Reactions  . Pollen Extract     seasonal  . Sulfa Antibiotics Other (See Comments)    Dizziness   . Sulfamethoxazole-Trimethoprim     dizzy   . Benadryl [Diphenhydramine Hcl (Sleep)] Anxiety    Review of Systems  Constitutional: Negative.   HENT: Negative.    Eyes: Negative.   Respiratory: Negative.    Cardiovascular: Negative.   Gastrointestinal:  Positive for heartburn.  Genitourinary:  Negative for frequency and urgency.       Nocturia  Musculoskeletal:  Positive for joint pain.  Skin: Negative.   Neurological: Negative.   Endo/Heme/Allergies: Negative.   Psychiatric/Behavioral:  The patient does not have insomnia.        Objective:   BP (!) 140/80   Pulse 73   Ht 5\' 4"  (1.626 m)   Wt 164 lb 6.4 oz (74.6 kg)   SpO2 96%   BMI 28.22  kg/m   Vitals:   08/10/22 1323  BP: (!) 140/80  Pulse: 73  Height: 5\' 4"  (1.626 m)  Weight: 164 lb 6.4 oz (74.6 kg)  SpO2: 96%  BMI (Calculated): 28.21    Physical Exam Vitals reviewed.  Constitutional:      Appearance: Normal appearance.  HENT:     Head: Normocephalic.     Left Ear: There is no impacted cerumen.     Nose: Nose normal.     Mouth/Throat:     Mouth: Mucous membranes are moist.  Pharynx: No posterior oropharyngeal erythema.  Eyes:     Extraocular Movements: Extraocular movements intact.     Pupils: Pupils are equal, round, and reactive to light.  Cardiovascular:     Rate and Rhythm: Regular rhythm.     Chest Wall: PMI is not displaced.     Pulses: Normal pulses.     Heart sounds: Normal heart sounds. No murmur heard.    Comments: Sternal incision stable Pulmonary:     Effort: Pulmonary effort is normal.     Breath sounds: Normal air entry. No rhonchi or rales.  Abdominal:     General: Abdomen is flat. Bowel sounds are normal. There is no distension.     Palpations: Abdomen is soft. There is no hepatomegaly, splenomegaly or mass.     Tenderness: There is no abdominal tenderness.  Musculoskeletal:        General: Normal range of motion.     Cervical back: Normal range of motion and neck supple.     Right lower leg: No edema.     Left lower leg: No edema.  Skin:    General: Skin is warm and dry.  Neurological:     General: No focal deficit present.     Mental Status: He is alert and oriented to person, place, and time.     Cranial Nerves: No cranial nerve deficit.     Motor: No weakness.  Psychiatric:        Mood and Affect: Mood normal.        Behavior: Behavior normal.     No results found for any visits on 08/10/22.  Recent Results (from the past 2160 hour(Felicita Nuncio))  PSA     Status: Abnormal   Collection Time: 05/13/22  1:34 PM  Result Value Ref Range   Prostate Specific Ag, Serum 4.3 (H) 0.0 - 4.0 ng/mL    Comment: Roche ECLIA  methodology. According to the American Urological Association, Serum PSA should decrease and remain at undetectable levels after radical prostatectomy. The AUA defines biochemical recurrence as an initial PSA value 0.2 ng/mL or greater followed by a subsequent confirmatory PSA value 0.2 ng/mL or greater. Values obtained with different assay methods or kits cannot be used interchangeably. Results cannot be interpreted as absolute evidence of the presence or absence of malignant disease.       Assessment & Plan:   Problem List Items Addressed This Visit   None   No follow-ups on file.   Total time spent: 45 minutes  Luna Fuse, MD  08/10/2022

## 2022-08-17 ENCOUNTER — Telehealth: Payer: Self-pay | Admitting: Internal Medicine

## 2022-08-17 NOTE — Telephone Encounter (Signed)
Patient's wife called and was inquiring about the x-ray of the left knee that you ordered. Did you want this x-ray done here? If so please let me know so we can get him scheduled.

## 2022-08-19 NOTE — Telephone Encounter (Signed)
Called patient left voicemail to have patient to call back to schedule knee xray

## 2022-08-23 ENCOUNTER — Ambulatory Visit (INDEPENDENT_AMBULATORY_CARE_PROVIDER_SITE_OTHER): Payer: Medicare PPO

## 2022-08-23 DIAGNOSIS — M25562 Pain in left knee: Secondary | ICD-10-CM

## 2022-08-24 ENCOUNTER — Other Ambulatory Visit: Payer: Self-pay | Admitting: Internal Medicine

## 2022-08-24 DIAGNOSIS — M25562 Pain in left knee: Secondary | ICD-10-CM

## 2022-10-25 DIAGNOSIS — M1712 Unilateral primary osteoarthritis, left knee: Secondary | ICD-10-CM | POA: Diagnosis not present

## 2022-10-27 ENCOUNTER — Other Ambulatory Visit: Payer: Self-pay | Admitting: Internal Medicine

## 2022-10-27 DIAGNOSIS — F419 Anxiety disorder, unspecified: Secondary | ICD-10-CM

## 2022-10-27 MED ORDER — CLONAZEPAM 0.5 MG PO TABS: 0.5000 mg | ORAL_TABLET | Freq: Every day | ORAL | 0 refills | Status: DC

## 2022-10-27 NOTE — Telephone Encounter (Signed)
The clonazepam Rx was printed, please send electronically.

## 2022-10-28 ENCOUNTER — Other Ambulatory Visit: Payer: Self-pay

## 2022-10-29 DIAGNOSIS — M1712 Unilateral primary osteoarthritis, left knee: Secondary | ICD-10-CM | POA: Diagnosis not present

## 2022-11-02 DIAGNOSIS — M1712 Unilateral primary osteoarthritis, left knee: Secondary | ICD-10-CM | POA: Diagnosis not present

## 2022-11-04 DIAGNOSIS — M1712 Unilateral primary osteoarthritis, left knee: Secondary | ICD-10-CM | POA: Diagnosis not present

## 2022-11-05 ENCOUNTER — Other Ambulatory Visit: Payer: Medicare PPO

## 2022-11-05 DIAGNOSIS — R7302 Impaired glucose tolerance (oral): Secondary | ICD-10-CM

## 2022-11-05 DIAGNOSIS — E669 Obesity, unspecified: Secondary | ICD-10-CM

## 2022-11-05 DIAGNOSIS — E782 Mixed hyperlipidemia: Secondary | ICD-10-CM

## 2022-11-05 DIAGNOSIS — I10 Essential (primary) hypertension: Secondary | ICD-10-CM | POA: Diagnosis not present

## 2022-11-09 DIAGNOSIS — M1712 Unilateral primary osteoarthritis, left knee: Secondary | ICD-10-CM | POA: Diagnosis not present

## 2022-11-10 ENCOUNTER — Ambulatory Visit (INDEPENDENT_AMBULATORY_CARE_PROVIDER_SITE_OTHER): Payer: Medicare PPO | Admitting: Internal Medicine

## 2022-11-10 VITALS — BP 110/60 | HR 75 | Ht 64.0 in | Wt 161.8 lb

## 2022-11-10 DIAGNOSIS — I2583 Coronary atherosclerosis due to lipid rich plaque: Secondary | ICD-10-CM

## 2022-11-10 DIAGNOSIS — R7303 Prediabetes: Secondary | ICD-10-CM | POA: Diagnosis not present

## 2022-11-10 DIAGNOSIS — I10 Essential (primary) hypertension: Secondary | ICD-10-CM

## 2022-11-10 DIAGNOSIS — I2510 Atherosclerotic heart disease of native coronary artery without angina pectoris: Secondary | ICD-10-CM

## 2022-11-10 DIAGNOSIS — F419 Anxiety disorder, unspecified: Secondary | ICD-10-CM

## 2022-11-10 MED ORDER — CLONAZEPAM 0.5 MG PO TABS
0.5000 mg | ORAL_TABLET | Freq: Every day | ORAL | 2 refills | Status: DC
Start: 2022-11-10 — End: 2023-02-11

## 2022-11-10 NOTE — Progress Notes (Signed)
**Note De-Identified via Obfucation** Etablihed Patient Office Viit  Subjective:  Patient ID: Bruce Gomez, male    DOB: [birth year]  Age: 77 y.o. MRN: [id number]  Chief Complaint  Patient preent with   Follow-up    3 mo f/u with lab reult    No new complaint, here for lab review and medication refill. Lab reviewed and notable for well controlled lipid, A1c of 5.7, unremarkable CMP and normal TSH.    No other concern at thi time.   Pat Medical Hitory:  Diagnoi Date   Actinic keratoi    Anemia    Aortic cleroi 04/21/2017   Overview:  Moderate Aortic Scleroi, mild LVH,  mild Mitral valve thickening, Slight LAE on ECHO - tarted  on ACE  for  LVH   Arthriti    hip and lower back   Baal cell carcinoma 04/26/2022   Right noe - EDC   BPH with obtruction/lower urinary tract ymptom 03/19/2014   BPH with obtruction/lower urinary tract ymptom 03/19/2014   CAD (coronary artery dieae) 04/21/2017   Overview:   ilent MI - Inferior; /p CABG x 3; Stre Echo  6/05; 8/08;  2/12   Chronic protatiti 11/03/2000   Coronary artery dieae    Elevated PSA 03/19/2014   Erectile dyfunction 03/19/2014   Hitory of baal cell carcinoma 07/02/2015   right noe   Hitory of dyplatic nevu 06/22/2017   left ant deltoid   Hyperlipidemia 04/21/2017   Hypertenion    Impaired glucoe tolerance 04/21/2017   Myocardial infarction (HCC) 2002   OSA (obtructive leep apnea) 04/21/2017   Overview:  rx with wt lo, ide leeping-no CPAP, not true leep apnea   RLS (retle leg yndrome) 04/21/2017    Pat Surgical Hitory:  Procedure Laterality Date   ADENOIDECTOMY     CARDIAC SURGERY     cabg 2002   CATARACT EXTRACTION W/PHACO Right 07/26/2022   Procedure: CATARACT EXTRACTION PHACO AND INTRAOCULAR LENS PLACEMENT (IOC) RIGHT;  Surgeon: Nevada Crane, MD;  Location: [facility];  Service: Ophthalmology;  Laterality: Right;  9.76 0:59.2   CATARACT EXTRACTION W/PHACO Left  08/09/2022   Procedure: CATARACT EXTRACTION PHACO AND INTRAOCULAR LENS PLACEMENT (IOC) LEFT;  Surgeon: Nevada Crane, MD;  Location: [facility];  Service: Ophthalmology;  Laterality: Left;  2.59  00:25.8   COLONOSCOPY WITH PROPOFOL N/A 12/14/2019   Procedure: COLONOSCOPY WITH PROPOFOL;  Surgeon: Midge Minium, MD;  Location: [facility];  Service: Endocopy;  Laterality: N/A;  priority 4   CORONARY ARTERY BYPASS GRAFT  2002   x3   INGUINAL HERNIA REPAIR     POLYPECTOMY  12/14/2019   Procedure: POLYPECTOMY;  Surgeon: Midge Minium, MD;  Location: [facility];  Service: Endocopy;;   right thumb pin Right 04/2021   TRANSURETHRAL RESECTION OF PROSTATE     2014    Social Hitory   Socioeconomic Hitory   Marital tatu: Married    Spoue name: Not on file   Number of children: Not on file   Year of education: Not on file   Highet education level: Not on file  Occupational Hitory   Not on file  Tobacco Ue   Smoking tatu: Never   Smokele tobacco: Never  Vaping Ue   Vaping tatu: Never Ued  Subtance and Sexual Activity   Alcohol ue: Ye    Alcohol/week: 1.0 - 2.0 tandard drink of alcohol    Type: 1 - 2 Glae of wine per week    Comment: ocaional  glass of wine   Drug use: No   Sexual activity: Yes    Birth control/protection: None  Other Topics Concern   Not on file  Social History Narrative   Not on file   Social Determinants of Health   Financial Resource Strain: Low Risk  (03/15/2022)   Overall Financial Resource Strain (CARDIA)    Difficulty of Paying Living Expenses: Not hard at all  Food Insecurity: No Food Insecurity (03/15/2022)   Hunger Vital Sign    Worried About Running Out of Food in the Last Year: Never true    Ran Out of Food in the Last Year: Never true  Transportation Needs: No Transportation Needs (03/15/2022)   PRAPARE - Administrator, Civil Service (Medical): No    Lack of Transportation  (Non-Medical): No  Physical Activity: Sufficiently Active (03/15/2022)   Exercise Vital Sign    Days of Exercise per Week: 4 days    Minutes of Exercise per Session: 40 min  Stress: No Stress Concern Present (03/15/2022)   [employer] of Occupational Health - Occupational Stress Questionnaire    Feeling of Stress : Not at all  Social Connections: Moderately Integrated (03/15/2022)   Social Connection and Isolation Panel [NHANES]    Frequency of Communication with Friends and Family: More than three times a week    Frequency of Social Gatherings with Friends and Family: More than three times a week    Attends Religious Services: More than 4 times per year    Active Member of [employer] or Organizations: Not on file    Attends Banker Meetings: Never    Marital Status: Married  Catering manager Violence: Not At Risk (03/15/2022)   Humiliation, Afraid, Rape, and Kick questionnaire    Fear of Current or Ex-Partner: No    Emotionally Abused: No    Physically Abused: No    Sexually Abused: No    Family History  Problem Relation Age of Onset   Prostate cancer Paternal Uncle    Bladder Cancer Neg Hx    Kidney cancer Neg Hx     Allergies  Allergen Reactions   Pollen Extract     seasonal   Sulfa Antibiotics Other (See Comments)    Dizziness    Sulfamethoxazole-Trimethoprim     dizzy    Benadryl [Diphenhydramine Hcl (Sleep)] Anxiety    Review of Systems  Constitutional: Negative.   HENT: Negative.    Eyes: Negative.   Respiratory: Negative.    Cardiovascular: Negative.   Gastrointestinal:  Positive for heartburn.  Genitourinary:  Negative for frequency and urgency.       Nocturia  Musculoskeletal:  Positive for joint pain.  Skin: Negative.   Neurological: Negative.   Endo/Heme/Allergies: Negative.   Psychiatric/Behavioral:  The patient does not have insomnia.        Objective:   BP 110/60   Pulse 75   Ht 5\' 4"  (1.626 m)   Wt 161 lb 12.8 oz (73.4  kg)   SpO2 98%   BMI 27.77 kg/m   Vitals:   11/10/22 0855  BP: 110/60  Pulse: 75  Height: 5\' 4"  (1.626 m)  Weight: 161 lb 12.8 oz (73.4 kg)  SpO2: 98%  BMI (Calculated): 27.76    Physical Exam Vitals reviewed.  Constitutional:      Appearance: Normal appearance.  HENT:     Head: Normocephalic.     Left Ear: There is no impacted cerumen.     Nose: Nose normal. **Note De-Identified via Obfucation** Mouth/Throat:     Mouth: Mucou membrane are moit.     Pharynx: No poterior oropharyngeal erythema.  Eye:     Extraocular Movement: Extraocular movement intact.     Pupil: Pupil are equal, round, and reactive to light.  Cardiovacular:     Rate and Rhythm: Regular rhythm.     Chet Wall: PMI i not diplaced.     Pule: Normal pule.     Heart ound: Normal heart ound. No murmur heard.    Comment: Sternal inciion table Pulmonary:     Effort: Pulmonary effort i normal.     Breath ound: Normal air entry. No rhonchi or rale.  Abdominal:     General: Abdomen i flat. Bowel ound are normal. There i no ditenion.     Palpation: Abdomen i oft. There i no hepatomegaly, plenomegaly or ma.     Tenderne: There i no abdominal tenderne.  Muculokeletal:        General: Normal range of motion.     Cervical back: Normal range of motion and neck upple.     Right lower leg: No edema.     Left lower leg: No edema.  Skin:    General: Skin i warm and dry.  Neurological:     General: No focal deficit preent.     Mental Statu: He i alert and oriented to peron, place, and time.     Cranial Nerve: No cranial nerve deficit.     Motor: No weakne.  Pychiatric:        Mood and Affect: Mood normal.        Behavior: Behavior normal.      No reult found for any viit on 11/10/22.  Recent Reult (from the pat 2160 hour())  Hemoglobin A1c     Statu: Abnormal   Collection Time: 11/05/22  9:20 AM  Reult Value Ref Range   Hgb A1c MFr Bld 5.7 (H) 4.8 - 5.6 %    Comment:           Prediabete: 5.7 - 6.4          Diabete: >6.4          Glycemic control for adult with diabete: <7.0    Et. average glucoe Bld gHb Et-mCnc 117 mg/dL  TSH     Statu: None   Collection Time: 11/05/22  9:20 AM  Reult Value Ref Range   TSH 1.010 0.450 - 4.500 uIU/mL  CMP14+EGFR     Statu: None   Collection Time: 11/05/22  9:20 AM  Reult Value Ref Range   Glucoe 95 70 - 99 mg/dL   BUN 17 8 - 27 mg/dL   Creatinine, Ser 9.81 0.76 - 1.27 mg/dL   eGFR 64 >19 JY/NWG/9.56   BUN/Creatinine Ratio 14 10 - 24   Sodium 136 134 - 144 mmol/L   Potaium 5.2 3.5 - 5.2 mmol/L   Chloride 102 96 - 106 mmol/L   CO2 24 20 - 29 mmol/L   Calcium 9.5 8.6 - 10.2 mg/dL   Total Protein 6.5 6.0 - 8.5 g/dL   Albumin 4.3 3.8 - 4.8 g/dL   Globulin, Total 2.2 1.5 - 4.5 g/dL   Bilirubin Total 0.3 0.0 - 1.2 mg/dL   Alkaline Phophatae 89 44 - 121 IU/L   AST 19 0 - 40 IU/L   ALT 16 0 - 44 IU/L  Lipid panel     Statu: None   Collection Time: 11/05/22  9:20 AM  Reult Value  Ref Range   Cholesterol, Total 147 100 - 199 mg/dL   Triglycerides 82 0 - 149 mg/dL   HDL 71 >84 mg/dL   VLDL Cholesterol Cal 16 5 - 40 mg/dL   LDL Chol Calc (NIH) 60 0 - 99 mg/dL   Chol/HDL Ratio 2.1 0.0 - 5.0 ratio    Comment:                                   T. Chol/HDL Ratio                                             Men  Women                               1/2 Avg.Risk  3.4    3.3                                   Avg.Risk  5.0    4.4                                2X Avg.Risk  9.6    7.1                                3X Avg.Risk 23.4   11.0   CBC with Differential/Platelet     Status: Abnormal   Collection Time: 11/05/22  9:20 AM  Result Value Ref Range   WBC 8.6 3.4 - 10.8 x10E3/uL   RBC 4.18 4.14 - 5.80 x10E6/uL   Hemoglobin 13.3 13.0 - 17.7 g/dL   Hematocrit 13.2 44.0 - 51.0 %   MCV 98 (H) 79 - 97 fL   MCH 31.8 26.6 - 33.0 pg   MCHC 32.4 31.5 - 35.7 g/dL   RDW 10.2 72.5 - 36.6 %   Platelets 166 150 -  450 x10E3/uL   Neutrophils 69 Not Estab. %   Lymphs 15 Not Estab. %   Monocytes 8 Not Estab. %   Eos 7 Not Estab. %   Basos 1 Not Estab. %   Neutrophils Absolute 5.9 1.4 - 7.0 x10E3/uL   Lymphocytes Absolute 1.3 0.7 - 3.1 x10E3/uL   Monocytes Absolute 0.7 0.1 - 0.9 x10E3/uL   EOS (ABSOLUTE) 0.6 (H) 0.0 - 0.4 x10E3/uL   Basophils Absolute 0.0 0.0 - 0.2 x10E3/uL   Immature Granulocytes 0 Not Estab. %   Immature Grans (Abs) 0.0 0.0 - 0.1 x10E3/uL      Assessment & Plan:  As per problem list.  Problem List Items Addressed This Visit       Cardiovascular and Mediastinum   CAD (coronary artery disease) - Primary   Relevant Orders   Comprehensive metabolic panel   Lipid panel   Essential hypertension     Other   Prediabetes   Relevant Orders   Hemoglobin A1c   Anxiety   Relevant Medications   clonazePAM (KLONOPIN) 0.5 MG tablet    Return in about 3 months (around 02/10/2023) for fu with labs prior.   Total time spent: 20  minutes  Luna Fuse, MD  11/10/2022   This document may have been prepared by [facility] Voice Recognition software and as such may include unintentional dictation errors.

## 2022-11-12 DIAGNOSIS — M1712 Unilateral primary osteoarthritis, left knee: Secondary | ICD-10-CM | POA: Diagnosis not present

## 2022-11-15 DIAGNOSIS — M1712 Unilateral primary osteoarthritis, left knee: Secondary | ICD-10-CM | POA: Diagnosis not present

## 2022-11-19 DIAGNOSIS — M1712 Unilateral primary osteoarthritis, left knee: Secondary | ICD-10-CM | POA: Diagnosis not present

## 2023-02-01 ENCOUNTER — Other Ambulatory Visit: Payer: Self-pay | Admitting: Nurse Practitioner

## 2023-02-03 ENCOUNTER — Other Ambulatory Visit: Payer: Self-pay

## 2023-02-03 DIAGNOSIS — I10 Essential (primary) hypertension: Secondary | ICD-10-CM

## 2023-02-07 ENCOUNTER — Other Ambulatory Visit: Payer: Self-pay

## 2023-02-08 ENCOUNTER — Other Ambulatory Visit: Payer: Medicare PPO

## 2023-02-08 DIAGNOSIS — I2510 Atherosclerotic heart disease of native coronary artery without angina pectoris: Secondary | ICD-10-CM | POA: Diagnosis not present

## 2023-02-08 DIAGNOSIS — R7303 Prediabetes: Secondary | ICD-10-CM

## 2023-02-08 DIAGNOSIS — I2583 Coronary atherosclerosis due to lipid rich plaque: Secondary | ICD-10-CM | POA: Diagnosis not present

## 2023-02-09 ENCOUNTER — Ambulatory Visit: Payer: Medicare PPO | Admitting: Dermatology

## 2023-02-09 LAB — COMPREHENSIVE METABOLIC PANEL
ALT: 14 [IU]/L (ref 0–44)
AST: 22 [IU]/L (ref 0–40)
Albumin: 4.3 g/dL (ref 3.8–4.8)
Alkaline Phosphatase: 83 [IU]/L (ref 44–121)
BUN/Creatinine Ratio: 15 (ref 10–24)
BUN: 17 mg/dL (ref 8–27)
Bilirubin Total: 0.4 mg/dL (ref 0.0–1.2)
CO2: 24 mmol/L (ref 20–29)
Calcium: 9.3 mg/dL (ref 8.6–10.2)
Chloride: 101 mmol/L (ref 96–106)
Creatinine, Ser: 1.16 mg/dL (ref 0.76–1.27)
Globulin, Total: 2.1 g/dL (ref 1.5–4.5)
Glucose: 85 mg/dL (ref 70–99)
Potassium: 5 mmol/L (ref 3.5–5.2)
Sodium: 137 mmol/L (ref 134–144)
Total Protein: 6.4 g/dL (ref 6.0–8.5)
eGFR: 64 mL/min/{1.73_m2} (ref >59)

## 2023-02-09 LAB — LIPID PANEL
Chol/HDL Ratio: 2.1 ratio (ref 0.0–5.0)
Cholesterol, Total: 144 mg/dL (ref 100–199)
HDL: 70 mg/dL (ref >39)
LDL Chol Calc (NIH): 58 mg/dL (ref 0–99)
Triglycerides: 85 mg/dL (ref 0–149)
VLDL Cholesterol Cal: 16 mg/dL (ref 5–40)

## 2023-02-09 LAB — HEMOGLOBIN A1C
Est. average glucose Bld gHb Est-mCnc: 111 mg/dL
Hgb A1c MFr Bld: 5.5 % (ref 4.8–5.6)

## 2023-02-11 ENCOUNTER — Ambulatory Visit (INDEPENDENT_AMBULATORY_CARE_PROVIDER_SITE_OTHER): Payer: Medicare PPO | Admitting: Internal Medicine

## 2023-02-11 ENCOUNTER — Encounter: Payer: Self-pay | Admitting: Internal Medicine

## 2023-02-11 VITALS — BP 128/72 | HR 63 | Ht 63.0 in | Wt 162.4 lb

## 2023-02-11 DIAGNOSIS — I2510 Atherosclerotic heart disease of native coronary artery without angina pectoris: Secondary | ICD-10-CM | POA: Diagnosis not present

## 2023-02-11 DIAGNOSIS — I2583 Coronary atherosclerosis due to lipid rich plaque: Secondary | ICD-10-CM | POA: Diagnosis not present

## 2023-02-11 DIAGNOSIS — N401 Enlarged prostate with lower urinary tract symptoms: Secondary | ICD-10-CM | POA: Diagnosis not present

## 2023-02-11 DIAGNOSIS — I10 Essential (primary) hypertension: Secondary | ICD-10-CM

## 2023-02-11 DIAGNOSIS — N138 Other obstructive and reflux uropathy: Secondary | ICD-10-CM | POA: Diagnosis not present

## 2023-02-11 DIAGNOSIS — F419 Anxiety disorder, unspecified: Secondary | ICD-10-CM

## 2023-02-11 MED ORDER — CLONAZEPAM 0.5 MG PO TABS
0.5000 mg | ORAL_TABLET | Freq: Every day | ORAL | 2 refills | Status: DC
Start: 2023-02-11 — End: 2023-06-21

## 2023-02-11 MED ORDER — LISINOPRIL 10 MG PO TABS: 10.0000 mg | ORAL_TABLET | Freq: Every day | ORAL | 1 refills | Status: DC

## 2023-02-11 NOTE — Progress Notes (Signed)
Established Patient Office Visit  Subjective:  Patient ID: Bruce Gomez, male    DOB: July 02, 1945  Age: 77 y.o. MRN: [id number]  Chief Complaint  Patient presents with   Follow-up    No new complaints, here for lab review and medication refills. LDL and TC well controlled on lab review. Triglycerides also satisfactory, A1c returned to normal and cmp unremarkable.    No other concerns at this time.   Past Medical History:  Diagnosis Date   Actinic keratosis    Anemia    Aortic sclerosis 04/21/2017   Overview:  Moderate Aortic Sclerosis, mild LVH,  mild Mitral valve thickening, Slight LAE on ECHO - started  on ACE  for  LVH   Arthritis    hips and lower back   Basal cell carcinoma 04/26/2022   Right nose - EDC   BPH with obstruction/lower urinary tract symptoms 03/19/2014   BPH with obstruction/lower urinary tract symptoms 03/19/2014   CAD (coronary artery disease) 04/21/2017   Overview:   silent MI - Inferior; Shasha Buchbinder/p CABG x 3; Stress Echo  6/05; 8/08;  2/12   Chronic prostatitis 11/03/2000   Coronary artery disease    Elevated PSA 03/19/2014   Erectile dysfunction 03/19/2014   History of basal cell carcinoma 07/02/2015   right nose   History of dysplastic nevus 06/22/2017   left ant deltoid   Hyperlipidemia 04/21/2017   Hypertension    Impaired glucose tolerance 04/21/2017   Myocardial infarction (HCC) 2002   OSA (obstructive sleep apnea) 04/21/2017   Overview:  rx with wt loss, side sleeping-no CPAP, not true sleep apnea   RLS (restless legs syndrome) 04/21/2017    Past Surgical History:  Procedure Laterality Date   ADENOIDECTOMY     CARDIAC SURGERY     cabg 2002   CATARACT EXTRACTION W/PHACO Right 07/26/2022   Procedure: CATARACT EXTRACTION PHACO AND INTRAOCULAR LENS PLACEMENT (IOC) RIGHT;  Surgeon: Nevada Crane, MD;  Location: [facility];  Service: Ophthalmology;  Laterality: Right;  9.76 0:59.2   CATARACT EXTRACTION W/PHACO Left 08/09/2022    Procedure: CATARACT EXTRACTION PHACO AND INTRAOCULAR LENS PLACEMENT (IOC) LEFT;  Surgeon: Nevada Crane, MD;  Location: [facility];  Service: Ophthalmology;  Laterality: Left;  2.59  00:25.8   COLONOSCOPY WITH PROPOFOL N/A 12/14/2019   Procedure: COLONOSCOPY WITH PROPOFOL;  Surgeon: Midge Minium, MD;  Location: [facility];  Service: Endoscopy;  Laterality: N/A;  priority 4   CORONARY ARTERY BYPASS GRAFT  2002   x3   INGUINAL HERNIA REPAIR     POLYPECTOMY  12/14/2019   Procedure: POLYPECTOMY;  Surgeon: Midge Minium, MD;  Location: [facility];  Service: Endoscopy;;   right thumb pin Right 04/2021   TRANSURETHRAL RESECTION OF PROSTATE     2014    Social History   Socioeconomic History   Marital status: Married    Spouse name: Not on file   Number of children: Not on file   Years of education: Not on file   Highest education level: Not on file  Occupational History   Not on file  Tobacco Use   Smoking status: Never   Smokeless tobacco: Never  Vaping Use   Vaping status: Never Used  Substance and Sexual Activity   Alcohol use: Yes    Alcohol/week: 1.0 - 2.0 standard drink of alcohol    Types: 1 - 2 Glasses of wine per week    Comment: ocassional glass of wine   Drug use:  No   Sexual activity: Yes    Birth control/protection: None  Other Topics Concern   Not on file  Social History Narrative   Not on file   Social Determinants of Health   Financial Resource Strain: Low Risk  (03/15/2022)   Overall Financial Resource Strain (CARDIA)    Difficulty of Paying Living Expenses: Not hard at all  Food Insecurity: No Food Insecurity (03/15/2022)   Hunger Vital Sign    Worried About Running Out of Food in the Last Year: Never true    Ran Out of Food in the Last Year: Never true  Transportation Needs: No Transportation Needs (03/15/2022)   PRAPARE - Administrator, Civil Service (Medical): No    Lack of Transportation (Non-Medical): No   Physical Activity: Sufficiently Active (03/15/2022)   Exercise Vital Sign    Days of Exercise per Week: 4 days    Minutes of Exercise per Session: 40 min  Stress: No Stress Concern Present (03/15/2022)   [employer] of Occupational Health - Occupational Stress Questionnaire    Feeling of Stress : Not at all  Social Connections: Moderately Integrated (03/15/2022)   Social Connection and Isolation Panel [NHANES]    Frequency of Communication with Friends and Family: More than three times a week    Frequency of Social Gatherings with Friends and Family: More than three times a week    Attends Religious Services: More than 4 times per year    Active Member of [employer] or Organizations: Not on file    Attends Banker Meetings: Never    Marital Status: Married  Catering manager Violence: Not At Risk (03/15/2022)   Humiliation, Afraid, Rape, and Kick questionnaire    Fear of Current or Ex-Partner: No    Emotionally Abused: No    Physically Abused: No    Sexually Abused: No    Family History  Problem Relation Age of Onset   Prostate cancer Paternal Uncle    Bladder Cancer Neg Hx    Kidney cancer Neg Hx     Allergies  Allergen Reactions   Pollen Extract     seasonal   Sulfa Antibiotics Other (See Comments)    Dizziness    Sulfamethoxazole-Trimethoprim     dizzy    Benadryl [Diphenhydramine Hcl (Sleep)] Anxiety    Review of Systems  Constitutional: Negative.   HENT: Negative.    Eyes: Negative.   Respiratory: Negative.    Cardiovascular: Negative.   Gastrointestinal:  Positive for heartburn.  Genitourinary:  Negative for frequency and urgency.       Nocturia  Musculoskeletal:  Positive for joint pain.  Skin: Negative.   Neurological: Negative.   Endo/Heme/Allergies: Negative.   Psychiatric/Behavioral:  The patient does not have insomnia.        Objective:   BP 128/72   Pulse 63   Ht 5\' 3"  (1.6 m)   Wt 162 lb 6.4 oz (73.7 kg)   SpO2 99%   BMI  28.77 kg/m   Vitals:   02/11/23 0953  BP: 128/72  Pulse: 63  Height: 5\' 3"  (1.6 m)  Weight: 162 lb 6.4 oz (73.7 kg)  SpO2: 99%  BMI (Calculated): 28.78    Physical Exam Vitals reviewed.  Constitutional:      Appearance: Normal appearance.  HENT:     Head: Normocephalic.     Left Ear: There is no impacted cerumen.     Nose: Nose normal.     Mouth/Throat:  Mouth: Mucous membranes are moist.     Pharynx: No posterior oropharyngeal erythema.  Eyes:     Extraocular Movements: Extraocular movements intact.     Pupils: Pupils are equal, round, and reactive to light.  Cardiovascular:     Rate and Rhythm: Regular rhythm.     Chest Wall: PMI is not displaced.     Pulses: Normal pulses.     Heart sounds: Murmur heard.     Crescendo decrescendo systolic murmur is present with a grade of 2/6.     Comments: Sternal incision stable Pulmonary:     Effort: Pulmonary effort is normal.     Breath sounds: Normal air entry. No rhonchi or rales.  Abdominal:     General: Abdomen is flat. Bowel sounds are normal. There is no distension.     Palpations: Abdomen is soft. There is no hepatomegaly, splenomegaly or mass.     Tenderness: There is no abdominal tenderness.  Musculoskeletal:        General: Normal range of motion.     Cervical back: Normal range of motion and neck supple.     Right lower leg: No edema.     Left lower leg: No edema.  Skin:    General: Skin is warm and dry.  Neurological:     General: No focal deficit present.     Mental Status: He is alert and oriented to person, place, and time.     Cranial Nerves: No cranial nerve deficit.     Motor: No weakness.  Psychiatric:        Mood and Affect: Mood normal.        Behavior: Behavior normal.      No results found for any visits on 02/11/23.  Recent Results (from the past 2160 hour(Aloma Boch))  Hemoglobin A1c     Status: None   Collection Time: 02/08/23  9:07 AM  Result Value Ref Range   Hgb A1c MFr Bld 5.5 4.8 - 5.6  %    Comment:          Prediabetes: 5.7 - 6.4          Diabetes: >6.4          Glycemic control for adults with diabetes: <7.0    Est. average glucose Bld gHb Est-mCnc 111 mg/dL  Lipid panel     Status: None   Collection Time: 02/08/23  9:07 AM  Result Value Ref Range   Cholesterol, Total 144 100 - 199 mg/dL   Triglycerides 85 0 - 149 mg/dL   HDL 70 >78 mg/dL   VLDL Cholesterol Cal 16 5 - 40 mg/dL   LDL Chol Calc (NIH) 58 0 - 99 mg/dL   Chol/HDL Ratio 2.1 0.0 - 5.0 ratio    Comment:                                   T. Chol/HDL Ratio                                             Men  Women                               1/2 Avg.Risk  3.4    3.3  Avg.Risk  5.0    4.4                                2X Avg.Risk  9.6    7.1                                3X Avg.Risk 23.4   11.0   Comprehensive metabolic panel     Status: None   Collection Time: 02/08/23  9:07 AM  Result Value Ref Range   Glucose 85 70 - 99 mg/dL   BUN 17 8 - 27 mg/dL   Creatinine, Ser 1.30 0.76 - 1.27 mg/dL   eGFR 64 >86 VH/QIO/9.62   BUN/Creatinine Ratio 15 10 - 24   Sodium 137 134 - 144 mmol/L   Potassium 5.0 3.5 - 5.2 mmol/L   Chloride 101 96 - 106 mmol/L   CO2 24 20 - 29 mmol/L   Calcium 9.3 8.6 - 10.2 mg/dL   Total Protein 6.4 6.0 - 8.5 g/dL   Albumin 4.3 3.8 - 4.8 g/dL   Globulin, Total 2.1 1.5 - 4.5 g/dL   Bilirubin Total 0.4 0.0 - 1.2 mg/dL   Alkaline Phosphatase 83 44 - 121 IU/L   AST 22 0 - 40 IU/L   ALT 14 0 - 44 IU/L      Assessment & Plan:  As per problem list  Problem List Items Addressed This Visit       Cardiovascular and Mediastinum   Essential hypertension   Relevant Medications   lisinopril (ZESTRIL) 10 MG tablet     Genitourinary   BPH with obstruction/lower urinary tract symptoms - Primary     Other   Anxiety   Relevant Medications   clonazePAM (KLONOPIN) 0.5 MG tablet    Return in about 5 weeks (around 03/18/2023) for awv.   Total time  spent: 20 minutes  Luna Fuse, MD  02/11/2023   This document may have been prepared by [facility] Voice Recognition software and as such may include unintentional dictation errors.

## 2023-02-22 ENCOUNTER — Other Ambulatory Visit: Payer: Self-pay | Admitting: Internal Medicine

## 2023-02-22 MED ORDER — METOPROLOL SUCCINATE ER 25 MG PO TB24: 12.5000 mg | ORAL_TABLET | Freq: Two times a day (BID) | ORAL | 1 refills | Status: DC

## 2023-02-22 MED ORDER — NIACIN ER (ANTIHYPERLIPIDEMIC) 500 MG PO TBCR: 2000.0000 mg | EXTENDED_RELEASE_TABLET | Freq: Every evening | ORAL | 1 refills | Status: DC

## 2023-02-24 ENCOUNTER — Ambulatory Visit: Payer: Medicare PPO | Admitting: Dermatology

## 2023-02-24 DIAGNOSIS — D170 Benign lipomatous neoplasm of skin and subcutaneous tissue of head, face and neck: Secondary | ICD-10-CM

## 2023-02-24 DIAGNOSIS — L814 Other melanin hyperpigmentation: Secondary | ICD-10-CM

## 2023-02-24 DIAGNOSIS — D239 Other benign neoplasm of skin, unspecified: Secondary | ICD-10-CM | POA: Diagnosis not present

## 2023-02-24 DIAGNOSIS — Z86018 Personal history of other benign neoplasm: Secondary | ICD-10-CM

## 2023-02-24 DIAGNOSIS — D1801 Hemangioma of skin and subcutaneous tissue: Secondary | ICD-10-CM

## 2023-02-24 DIAGNOSIS — Z85828 Personal history of other malignant neoplasm of skin: Secondary | ICD-10-CM

## 2023-02-24 DIAGNOSIS — W908XXA Exposure to other nonionizing radiation, initial encounter: Secondary | ICD-10-CM | POA: Diagnosis not present

## 2023-02-24 DIAGNOSIS — L578 Other skin changes due to chronic exposure to nonionizing radiation: Secondary | ICD-10-CM

## 2023-02-24 DIAGNOSIS — L821 Other seborrheic keratosis: Secondary | ICD-10-CM

## 2023-02-24 DIAGNOSIS — D692 Other nonthrombocytopenic purpura: Secondary | ICD-10-CM

## 2023-02-24 DIAGNOSIS — Z1283 Encounter for screening for malignant neoplasm of skin: Secondary | ICD-10-CM | POA: Diagnosis not present

## 2023-02-24 DIAGNOSIS — D229 Melanocytic nevi, unspecified: Secondary | ICD-10-CM

## 2023-02-24 DIAGNOSIS — L820 Inflamed seborrheic keratosis: Secondary | ICD-10-CM | POA: Diagnosis not present

## 2023-02-24 DIAGNOSIS — L570 Actinic keratosis: Secondary | ICD-10-CM

## 2023-02-24 NOTE — Patient Instructions (Signed)

## 2023-02-24 NOTE — Progress Notes (Signed)
Follow-Up Visit   Subjective  Bruce Gomez is a 77 y.o. male who presents for the following: Skin Cancer Screening and Full Body Skin Exam  The patient presents for Total-Body Skin Exam (TBSE) for skin cancer screening and mole check. The patient has spots, moles and lesions to be evaluated, some may be new or changing. He has itchy spots on the right hand and right forehead. History of BCC of the right nose. History of Dysplastic Nevus of the left anterior deltoid.  Patient accompanied by wife.   The following portions of the chart were reviewed this encounter and updated as appropriate: medications, allergies, medical history  Review of Systems:  No other skin or systemic complaints except as noted in HPI or Assessment and Plan.  Objective  Well appearing patient in no apparent distress; mood and affect are within normal limits.  A full examination was performed including scalp, head, eyes, ears, nose, lips, neck, chest, axillae, abdomen, back, buttocks, bilateral upper extremities, bilateral lower extremities, hands, feet, fingers, toes, fingernails, and toenails. All findings within normal limits unless otherwise noted below.   Relevant physical exam findings are noted in the Assessment and Plan.  Right Dorsum Hand x 2 (2) Erythematous stuck-on, waxy papule or plaque  face x 7 (7) Erythematous thin papules/macules with gritty scale.     Assessment & Plan   SKIN CANCER SCREENING PERFORMED TODAY.  ACTINIC DAMAGE - Chronic condition, secondary to cumulative UV/sun exposure - diffuse scaly erythematous macules with underlying dyspigmentation - Recommend daily broad spectrum sunscreen SPF 30+ to sun-exposed areas, reapply every 2 hours as needed.  - Staying in the shade or wearing long sleeves, sun glasses (UVA+UVB protection) and wide brim hats (4-inch brim around the entire circumference of the hat) are also recommended for sun protection.  - Call for new or changing  lesions.  LENTIGINES, SEBORRHEIC KERATOSES, HEMANGIOMAS - Benign normal skin lesions - Benign-appearing - Call for any changes  BLUE NEVUS Exam: 0.3 CM blue macule, stable compared to photo 04/26/22  Treatment Plan: Benign appearing on exam today. Recommend observation. Call clinic for new or changing moles. Recommend daily use of broad spectrum spf 30+ sunscreen to sun-exposed areas.   MELANOCYTIC NEVI - Tan-brown and/or pink-flesh-colored symmetric macules and papules - Benign appearing on exam today - Observation - Call clinic for new or changing moles - Recommend daily use of broad spectrum spf 30+ sunscreen to sun-exposed areas.   HISTORY OF BASAL CELL CARCINOMA OF THE SKIN Right nose, 04/26/22 - No evidence of recurrence today - Recommend regular full body skin exams - Recommend daily broad spectrum sunscreen SPF 30+ to sun-exposed areas, reapply every 2 hours as needed.  - Call if any new or changing lesions are noted between office visits  History of Dysplastic Nevi - No evidence of recurrence today - Recommend regular full body skin exams - Recommend daily broad spectrum sunscreen SPF 30+ to sun-exposed areas, reapply every 2 hours as needed.  - Call if any new or changing lesions are noted between office visits  Purpura - Chronic; persistent and recurrent.  Treatable, but not curable. - Violaceous macules and patches - Benign - Related to trauma, age, sun damage and/or use of blood thinners, chronic use of topical and/or oral steroids - Observe - Can use OTC arnica containing moisturizer such as Dermend Bruise Formula if desired - Call for worsening or other concerns  Lipoma  Exam: Subcutaneous rubbery nodule, 4.5 cm Location: left posterior base of neck  Benign-appearing. Exam most consistent with a lipoma. Discussed that a lipoma is a benign fatty growth that can grow over time and sometimes get irritated. Recommend observation if it is not bothersome or changing.  Discussed option of ILK injections or surgical excision to remove it if it is growing, symptomatic, or other changes noted. Please call for new or changing lesions so they can be evaluated.    Inflamed seborrheic keratosis (2) Right Dorsum Hand x 2  Symptomatic, irritating, patient would like treated.  Destruction of lesion - Right Dorsum Hand x 2 (2) Complexity: simple   Destruction method: cryotherapy   Informed consent: discussed and consent obtained   Timeout:  patient name, date of birth, surgical site, and procedure verified Lesion destroyed using liquid nitrogen: Yes   Region frozen until ice ball extended beyond lesion: Yes   Outcome: patient tolerated procedure well with no complications   Post-procedure details: wound care instructions given    AK (actinic keratosis) (7) face x 7  Actinic keratoses are precancerous spots that appear secondary to cumulative UV radiation exposure/sun exposure over time. They are chronic with expected duration over 1 year. A portion of actinic keratoses will progress to squamous cell carcinoma of the skin. It is not possible to reliably predict which spots will progress to skin cancer and so treatment is recommended to prevent development of skin cancer.  Recommend daily broad spectrum sunscreen SPF 30+ to sun-exposed areas, reapply every 2 hours as needed.  Recommend staying in the shade or wearing long sleeves, sun glasses (UVA+UVB protection) and wide brim hats (4-inch brim around the entire circumference of the hat). Call for new or changing lesions.  Destruction of lesion - face x 7 (7) Complexity: simple   Destruction method: cryotherapy   Informed consent: discussed and consent obtained   Timeout:  patient name, date of birth, surgical site, and procedure verified Lesion destroyed using liquid nitrogen: Yes   Region frozen until ice ball extended beyond lesion: Yes   Outcome: patient tolerated procedure well with no complications    Post-procedure details: wound care instructions given     Return 6-8 mos, for AKs, Hx BCC.  Wendee Beavers, CMA, am acting as scribe for Armida Sans, MD .   Documentation: I have reviewed the above documentation for accuracy and completeness, and I agree with the above.  Armida Sans, MD

## 2023-02-28 ENCOUNTER — Ambulatory Visit (INDEPENDENT_AMBULATORY_CARE_PROVIDER_SITE_OTHER): Payer: Medicare PPO | Admitting: Cardiovascular Disease

## 2023-02-28 ENCOUNTER — Encounter: Payer: Self-pay | Admitting: Cardiovascular Disease

## 2023-02-28 VITALS — BP 130/72 | HR 85 | Ht 63.0 in | Wt 165.4 lb

## 2023-02-28 DIAGNOSIS — I2583 Coronary atherosclerosis due to lipid rich plaque: Secondary | ICD-10-CM

## 2023-02-28 DIAGNOSIS — R9431 Abnormal electrocardiogram [ECG] [EKG]: Secondary | ICD-10-CM | POA: Diagnosis not present

## 2023-02-28 DIAGNOSIS — E782 Mixed hyperlipidemia: Secondary | ICD-10-CM | POA: Diagnosis not present

## 2023-02-28 DIAGNOSIS — I252 Old myocardial infarction: Secondary | ICD-10-CM | POA: Diagnosis not present

## 2023-02-28 DIAGNOSIS — I2510 Atherosclerotic heart disease of native coronary artery without angina pectoris: Secondary | ICD-10-CM

## 2023-02-28 DIAGNOSIS — I10 Essential (primary) hypertension: Secondary | ICD-10-CM

## 2023-02-28 DIAGNOSIS — R0602 Shortness of breath: Secondary | ICD-10-CM

## 2023-02-28 DIAGNOSIS — G4733 Obstructive sleep apnea (adult) (pediatric): Secondary | ICD-10-CM | POA: Diagnosis not present

## 2023-02-28 DIAGNOSIS — R7303 Prediabetes: Secondary | ICD-10-CM | POA: Diagnosis not present

## 2023-02-28 NOTE — Progress Notes (Signed)
Cardiology Office Note   Date:  02/28/2023   ID:  Bruce Gomez, DOB Apr 03, 1946, MRN [id number]  PCP:  Bruce Monday, MD  Cardiologist:  Bruce Blackwater, MD      History of Present Illness: Bruce Gomez is a 77 y.o. male who presents for  Chief Complaint  Patient presents with   Establish Care    CAD    Has in past had echo, stress test being done by Bruce Gomez has retired. Had sept, 2002 had CABG 3 vessel at [facility]. Has DOE  Shortness of Breath This is a recurrent problem. The current episode started more than 1 year ago. The problem has been waxing and waning. Pertinent negatives include no chest pain, leg swelling, orthopnea or PND.      Past Medical History:  Diagnosis Date   Actinic keratosis    Anemia    Aortic sclerosis 04/21/2017   Overview:  Moderate Aortic Sclerosis, mild LVH,  mild Mitral valve thickening, Slight LAE on ECHO - started  on ACE  for  LVH   Arthritis    hips and lower back   Basal cell carcinoma 04/26/2022   Right nose - [facility]   BPH with obstruction/lower urinary tract symptoms 03/19/2014   BPH with obstruction/lower urinary tract symptoms 03/19/2014   CAD (coronary artery disease) 04/21/2017   Overview:   silent MI - Inferior; s/p CABG x 3; Stress Echo  6/05; 8/08;  2/12   Chronic prostatitis 11/03/2000   Coronary artery disease    Elevated PSA 03/19/2014   Erectile dysfunction 03/19/2014   History of basal cell carcinoma 07/02/2015   right nose   History of dysplastic nevus 06/22/2017   left ant deltoid   Hyperlipidemia 04/21/2017   Hypertension    Impaired glucose tolerance 04/21/2017   Myocardial infarction (HCC) 2002   OSA (obstructive sleep apnea) 04/21/2017   Overview:  rx with wt loss, side sleeping-no CPAP, not true sleep apnea   RLS (restless legs syndrome) 04/21/2017     Past Surgical History:  Procedure Laterality Date   ADENOIDECTOMY     CARDIAC SURGERY     cabg 2002   CATARACT EXTRACTION W/PHACO Right  07/26/2022   Procedure: CATARACT EXTRACTION PHACO AND INTRAOCULAR LENS PLACEMENT (IOC) RIGHT;  Surgeon: Bruce Crane, MD;  Location: [facility];  Service: Ophthalmology;  Laterality: Right;  9.76 0:59.2   CATARACT EXTRACTION W/PHACO Left 08/09/2022   Procedure: CATARACT EXTRACTION PHACO AND INTRAOCULAR LENS PLACEMENT (IOC) LEFT;  Surgeon: Bruce Crane, MD;  Location: [facility];  Service: Ophthalmology;  Laterality: Left;  2.59  00:25.8   COLONOSCOPY WITH PROPOFOL N/A 12/14/2019   Procedure: COLONOSCOPY WITH PROPOFOL;  Surgeon: Bruce Minium, MD;  Location: [facility];  Service: Endoscopy;  Laterality: N/A;  priority 4   CORONARY ARTERY BYPASS GRAFT  2002   x3   INGUINAL HERNIA REPAIR     POLYPECTOMY  12/14/2019   Procedure: POLYPECTOMY;  Surgeon: Bruce Minium, MD;  Location: [facility];  Service: Endoscopy;;   right thumb pin Right 04/2021   TRANSURETHRAL RESECTION OF PROSTATE     2014     Current Outpatient Medications  Medication Sig Dispense Refill   acetaminophen (TYLENOL) 500 MG tablet Take 500 mg by mouth every 6 (six) hours as needed.     aspirin EC 81 MG tablet Take 81 mg by mouth.     atorvastatin (LIPITOR) 10 MG tablet TAKE 1 TABLET EVERY DAY 90  tablet 3   B Complex Vitamins (B COMPLEX 1 PO) Take by mouth. Takes 2 a week     calcium carbonate (OSCAL) 1500 (600 Ca) MG TABS tablet Take by mouth.     calcium carbonate (TUMS - DOSED IN MG ELEMENTAL CALCIUM) 500 MG chewable tablet Chew 1 tablet by mouth as needed for indigestion or heartburn.     Cholecalciferol (VITAMIN D3) 2000 units capsule Take 2,000 Units by mouth every other day.     clonazePAM (KLONOPIN) 0.5 MG tablet Take 1 tablet (0.5 mg total) by mouth at bedtime. 30 tablet 2   Coenzyme Q10 100 MG capsule Take 200 mg by mouth. Takes 2 times week     esomeprazole (NEXIUM) 40 MG capsule TAKE 1 CAPSULE EVERY DAY (Patient taking differently: Take 40 mg by mouth as needed.) 90  capsule 2   ferrous sulfate 324 MG TBEC Take 324 mg by mouth daily.     finasteride (PROSCAR) 5 MG tablet TAKE 1 TABLET EVERY DAY 90 tablet 3   folic acid (FOLVITE) 400 MCG tablet Take 800 mcg by mouth. 2 times a week     lisinopril (ZESTRIL) 10 MG tablet Take 1 tablet (10 mg total) by mouth daily. 90 tablet 1   loratadine (CLARITIN) 10 MG tablet Take 10 mg by mouth daily as needed for allergies.     Melatonin 3 MG TABS Take by mouth.     metoprolol succinate (TOPROL-XL) 25 MG 24 hr tablet Take 0.5 tablets (12.5 mg total) by mouth 2 (two) times daily. 90 tablet 1   niacin (VITAMIN B3) 500 MG ER tablet Take 4 tablets (2,000 mg total) by mouth at bedtime. 90 tablet 1   Omega-3 1000 MG CAPS Take 1,200 mg by mouth daily.     sildenafil (VIAGRA) 100 MG tablet TAKE ONE TABLET BY MOUTH ONE HOUR PRIOR TO INTERCOURSE AS NEEDED 90 tablet 1   vitamin C (ASCORBIC ACID) 500 MG tablet Take 500 mg by mouth.     Zinc 50 MG TABS Take 1 tablet by mouth every 14 (fourteen) days.     No current facility-administered medications for this visit.    Allergies:   Pollen extract, Sulfa antibiotics, Sulfamethoxazole-trimethoprim, and Benadryl [diphenhydramine hcl (sleep)]    Social History:   reports that he has never smoked. He has never used smokeless tobacco. He reports current alcohol use of about 1.0 - 2.0 standard drink of alcohol per week. He reports that he does not use drugs.   Family History:  family history includes Prostate cancer in his paternal uncle.    ROS:     Review of Systems  Constitutional: Negative.   HENT: Negative.    Eyes: Negative.   Respiratory:  Positive for shortness of breath.   Cardiovascular:  Negative for chest pain, orthopnea, leg swelling and PND.  Gastrointestinal: Negative.   Genitourinary: Negative.   Musculoskeletal: Negative.   Skin: Negative.   Neurological: Negative.   Endo/Heme/Allergies: Negative.   Psychiatric/Behavioral: Negative.    All other systems  reviewed and are negative.     All other systems are reviewed and negative.    PHYSICAL EXAM: VS:  BP 130/72   Pulse 85   Ht 5\' 3"  (1.6 m)   Wt 165 lb 6.4 oz (75 kg)   SpO2 96%   BMI 29.30 kg/m  , BMI Body mass index is 29.3 kg/m. Last weight:  Wt Readings from Last 3 Encounters:  02/28/23 165 lb 6.4 oz (75 kg)  02/11/23 162 lb 6.4 oz (73.7 kg)  11/10/22 161 lb 12.8 oz (73.4 kg)     Physical Exam Vitals reviewed.  Constitutional:      Appearance: Normal appearance. He is normal weight.  HENT:     Head: Normocephalic.     Nose: Nose normal.     Mouth/Throat:     Mouth: Mucous membranes are moist.  Eyes:     Pupils: Pupils are equal, round, and reactive to light.  Cardiovascular:     Rate and Rhythm: Normal rate and regular rhythm.     Pulses: Normal pulses.     Heart sounds: Normal heart sounds.  Pulmonary:     Effort: Pulmonary effort is normal.  Abdominal:     General: Abdomen is flat. Bowel sounds are normal.  Musculoskeletal:        General: Normal range of motion.     Cervical back: Normal range of motion.  Skin:    General: Skin is warm.  Neurological:     General: No focal deficit present.     Mental Status: He is alert.  Psychiatric:        Mood and Affect: Mood normal.       EKG:   Recent Labs: 11/05/2022: Hemoglobin 13.3; Platelets 166; TSH 1.010 02/08/2023: ALT 14; BUN 17; Creatinine, Ser 1.16; Potassium 5.0; Sodium 137    Lipid Panel    Component Value Date/Time   CHOL 144 02/08/2023 0907   TRIG 85 02/08/2023 0907   HDL 70 02/08/2023 0907   CHOLHDL 2.1 02/08/2023 0907   CHOLHDL 2.0 02/24/2022 0855   LDLCALC 58 02/08/2023 0907   LDLCALC 54 02/24/2022 0855      Other studies Reviewed: Additional studies/ records that were reviewed today include:  Review of the above records demonstrates:       No data to display            ASSESSMENT AND PLAN:    ICD-10-CM   1. SOB (shortness of breath)  R06.02 PCV ECHOCARDIOGRAM  COMPLETE    MYOCARDIAL PERFUSION IMAGING    2. Essential hypertension  I10 PCV ECHOCARDIOGRAM COMPLETE    MYOCARDIAL PERFUSION IMAGING    3. Coronary artery disease due to lipid rich plaque  I25.10 PCV ECHOCARDIOGRAM COMPLETE   I25.83 MYOCARDIAL PERFUSION IMAGING    4. OSA (obstructive sleep apnea)  G47.33 PCV ECHOCARDIOGRAM COMPLETE    MYOCARDIAL PERFUSION IMAGING   says do not have it    5. Prediabetes  R73.03 PCV ECHOCARDIOGRAM COMPLETE    MYOCARDIAL PERFUSION IMAGING    6. Mixed hyperlipidemia  E78.2 PCV ECHOCARDIOGRAM COMPLETE    MYOCARDIAL PERFUSION IMAGING    7. Hx of acute myocardial infarction  I25.2 PCV ECHOCARDIOGRAM COMPLETE    MYOCARDIAL PERFUSION IMAGING    8. Abnormal EKG  R94.31 PCV ECHOCARDIOGRAM COMPLETE    MYOCARDIAL PERFUSION IMAGING   NSR 80/min old IWMI. Advise echo, stress test. Had silent MI    9. Coronary artery disease with hx of myocardial infarct w/o hx of CABG  I25.10 PCV ECHOCARDIOGRAM COMPLETE   I25.2 MYOCARDIAL PERFUSION IMAGING   3 vessel [facility]G at [facility]       Problem List Items Addressed This Visit       Cardiovascular and Mediastinum   CAD (coronary artery disease)   Relevant Orders   PCV ECHOCARDIOGRAM COMPLETE   MYOCARDIAL PERFUSION IMAGING   Essential hypertension   Relevant Orders   PCV ECHOCARDIOGRAM COMPLETE   MYOCARDIAL PERFUSION IMAGING  Respiratory   OSA (obstructive sleep apnea)   Relevant Orders   PCV ECHOCARDIOGRAM COMPLETE   MYOCARDIAL PERFUSION IMAGING     Other   Hyperlipidemia   Relevant Orders   PCV ECHOCARDIOGRAM COMPLETE   MYOCARDIAL PERFUSION IMAGING   Hx of acute myocardial infarction   Relevant Orders   PCV ECHOCARDIOGRAM COMPLETE   MYOCARDIAL PERFUSION IMAGING   Prediabetes   Relevant Orders   PCV ECHOCARDIOGRAM COMPLETE   MYOCARDIAL PERFUSION IMAGING   Other Visit Diagnoses     SOB (shortness of breath)    -  Primary   Relevant Orders   PCV ECHOCARDIOGRAM COMPLETE   MYOCARDIAL  PERFUSION IMAGING   Abnormal EKG       NSR 80/min old IWMI. Advise echo, stress test. Had silent MI   Relevant Orders   PCV ECHOCARDIOGRAM COMPLETE   MYOCARDIAL PERFUSION IMAGING   Coronary artery disease with hx of myocardial infarct w/o hx of CABG       3 vessel CABG at [facility]   Relevant Orders   PCV ECHOCARDIOGRAM COMPLETE   MYOCARDIAL PERFUSION IMAGING          Disposition:   Return in about 4 weeks (around 03/28/2023) for echo, stress test  amd f/u.    Total time spent: 50- minutes  Signed,  Bruce Blackwater, MD  02/28/2023 1:17 PM    [facility]

## 2023-03-01 ENCOUNTER — Encounter: Payer: Self-pay | Admitting: Dermatology

## 2023-03-02 ENCOUNTER — Encounter: Payer: Self-pay | Admitting: Cardiovascular Disease

## 2023-03-02 ENCOUNTER — Other Ambulatory Visit: Payer: Self-pay | Admitting: Internal Medicine

## 2023-03-02 MED ORDER — ATORVASTATIN CALCIUM 10 MG PO TABS: 10.0000 mg | ORAL_TABLET | Freq: Every day | ORAL | 3 refills | Status: DC

## 2023-03-02 MED ORDER — NIACIN ER (ANTIHYPERLIPIDEMIC) 500 MG PO TBCR: 2000.0000 mg | EXTENDED_RELEASE_TABLET | Freq: Every evening | ORAL | 1 refills | Status: DC

## 2023-03-02 MED ORDER — METOPROLOL SUCCINATE ER 25 MG PO TB24: 12.5000 mg | ORAL_TABLET | Freq: Two times a day (BID) | ORAL | 1 refills | Status: DC

## 2023-03-04 ENCOUNTER — Other Ambulatory Visit: Payer: Self-pay

## 2023-03-04 DIAGNOSIS — K2100 Gastro-esophageal reflux disease with esophagitis, without bleeding: Secondary | ICD-10-CM

## 2023-03-07 MED ORDER — ESOMEPRAZOLE MAGNESIUM 40 MG PO CPDR: 40.0000 mg | DELAYED_RELEASE_CAPSULE | Freq: Every day | ORAL | 1 refills | Status: DC

## 2023-03-08 ENCOUNTER — Other Ambulatory Visit: Payer: Self-pay

## 2023-03-10 ENCOUNTER — Other Ambulatory Visit: Payer: Self-pay | Admitting: Internal Medicine

## 2023-03-10 DIAGNOSIS — K2100 Gastro-esophageal reflux disease with esophagitis, without bleeding: Secondary | ICD-10-CM

## 2023-03-10 MED ORDER — ESOMEPRAZOLE MAGNESIUM 40 MG PO CPDR: 40.0000 mg | DELAYED_RELEASE_CAPSULE | Freq: Every day | ORAL | 1 refills | Status: DC

## 2023-03-15 ENCOUNTER — Ambulatory Visit (INDEPENDENT_AMBULATORY_CARE_PROVIDER_SITE_OTHER): Payer: Medicare PPO

## 2023-03-15 DIAGNOSIS — I2510 Atherosclerotic heart disease of native coronary artery without angina pectoris: Secondary | ICD-10-CM | POA: Diagnosis not present

## 2023-03-15 DIAGNOSIS — I10 Essential (primary) hypertension: Secondary | ICD-10-CM

## 2023-03-15 DIAGNOSIS — I252 Old myocardial infarction: Secondary | ICD-10-CM

## 2023-03-15 DIAGNOSIS — I2583 Coronary atherosclerosis due to lipid rich plaque: Secondary | ICD-10-CM

## 2023-03-15 DIAGNOSIS — G4733 Obstructive sleep apnea (adult) (pediatric): Secondary | ICD-10-CM

## 2023-03-15 DIAGNOSIS — R9431 Abnormal electrocardiogram [ECG] [EKG]: Secondary | ICD-10-CM

## 2023-03-15 DIAGNOSIS — R0602 Shortness of breath: Secondary | ICD-10-CM

## 2023-03-15 DIAGNOSIS — R7303 Prediabetes: Secondary | ICD-10-CM

## 2023-03-15 DIAGNOSIS — E782 Mixed hyperlipidemia: Secondary | ICD-10-CM

## 2023-03-15 MED ORDER — TECHNETIUM TC 99M SESTAMIBI GENERIC - CARDIOLITE
9.8000 | Freq: Once | INTRAVENOUS | Status: AC | PRN
  Administered 2023-03-15: 9.8 via INTRAVENOUS

## 2023-03-15 MED ORDER — TECHNETIUM TC 99M SESTAMIBI GENERIC - CARDIOLITE
33.6000 | Freq: Once | INTRAVENOUS | Status: AC | PRN
  Administered 2023-03-15: 33.6 via INTRAVENOUS

## 2023-03-17 ENCOUNTER — Other Ambulatory Visit: Payer: Medicare PPO

## 2023-03-21 ENCOUNTER — Ambulatory Visit (INDEPENDENT_AMBULATORY_CARE_PROVIDER_SITE_OTHER): Payer: Medicare PPO | Admitting: Internal Medicine

## 2023-03-21 ENCOUNTER — Encounter: Payer: Self-pay | Admitting: Internal Medicine

## 2023-03-21 VITALS — BP 118/70 | HR 79 | Ht 63.0 in | Wt 165.0 lb

## 2023-03-21 DIAGNOSIS — R7303 Prediabetes: Secondary | ICD-10-CM | POA: Diagnosis not present

## 2023-03-21 DIAGNOSIS — I10 Essential (primary) hypertension: Secondary | ICD-10-CM

## 2023-03-21 DIAGNOSIS — E782 Mixed hyperlipidemia: Secondary | ICD-10-CM | POA: Diagnosis not present

## 2023-03-21 DIAGNOSIS — Z1331 Encounter for screening for depression: Secondary | ICD-10-CM

## 2023-03-21 MED ORDER — NIACIN ER (ANTIHYPERLIPIDEMIC) 500 MG PO TBCR: 2000.0000 mg | EXTENDED_RELEASE_TABLET | Freq: Every evening | ORAL | 1 refills | Status: DC

## 2023-03-21 NOTE — Progress Notes (Signed)
Established Patient Office Visit  Subjective:  Patient ID: Bruce Gomez, male    DOB: 01-03-1946  Age: 77 y.o. MRN: [id number]  Chief Complaint  Patient presents with   Annual Exam    AWV and 5 week follow up    No new complaints, here for AWV refer to quality metrics and scanned documents. Reports flare of joint pain with change in weather.     No other concerns at this time.   Past Medical History:  Diagnosis Date   Actinic keratosis    Anemia    Aortic sclerosis 04/21/2017   Overview:  Moderate Aortic Sclerosis, mild LVH,  mild Mitral valve thickening, Slight LAE on ECHO - started  on ACE  for  LVH   Arthritis    hips and lower back   Basal cell carcinoma 04/26/2022   Right nose - EDC   BPH with obstruction/lower urinary tract symptoms 03/19/2014   BPH with obstruction/lower urinary tract symptoms 03/19/2014   CAD (coronary artery disease) 04/21/2017   Overview:   silent MI - Inferior; [place] Edelen/p CABG x 3; Stress Echo  6/05; 8/08;  2/12   Chronic prostatitis 11/03/2000   Coronary artery disease    Elevated PSA 03/19/2014   Erectile dysfunction 03/19/2014   History of basal cell carcinoma 07/02/2015   right nose   History of dysplastic nevus 06/22/2017   left ant deltoid   Hyperlipidemia 04/21/2017   Hypertension    Impaired glucose tolerance 04/21/2017   Myocardial infarction (HCC) 2002   OSA (obstructive sleep apnea) 04/21/2017   Overview:  rx with wt loss, side sleeping-no CPAP, not true sleep apnea   RLS (restless legs syndrome) 04/21/2017    Past Surgical History:  Procedure Laterality Date   ADENOIDECTOMY     CARDIAC SURGERY     cabg 2002   CATARACT EXTRACTION W/PHACO Right 07/26/2022   Procedure: CATARACT EXTRACTION PHACO AND INTRAOCULAR LENS PLACEMENT (IOC) RIGHT;  Surgeon: Nevada Crane, MD;  Location: [facility];  Service: Ophthalmology;  Laterality: Right;  9.76 0:59.2   CATARACT EXTRACTION W/PHACO Left 08/09/2022   Procedure: CATARACT  EXTRACTION PHACO AND INTRAOCULAR LENS PLACEMENT (IOC) LEFT;  Surgeon: Nevada Crane, MD;  Location: [facility];  Service: Ophthalmology;  Laterality: Left;  2.59  00:25.8   COLONOSCOPY WITH PROPOFOL N/A 12/14/2019   Procedure: COLONOSCOPY WITH PROPOFOL;  Surgeon: Midge Minium, MD;  Location: [facility];  Service: Endoscopy;  Laterality: N/A;  priority 4   CORONARY ARTERY BYPASS GRAFT  2002   x3   INGUINAL HERNIA REPAIR     POLYPECTOMY  12/14/2019   Procedure: POLYPECTOMY;  Surgeon: Midge Minium, MD;  Location: [facility];  Service: Endoscopy;;   right thumb pin Right 04/2021   TRANSURETHRAL RESECTION OF PROSTATE     2014    Social History   Socioeconomic History   Marital status: Married    Spouse name: Not on file   Number of children: Not on file   Years of education: Not on file   Highest education level: Not on file  Occupational History   Not on file  Tobacco Use   Smoking status: Never   Smokeless tobacco: Never  Vaping Use   Vaping status: Never Used  Substance and Sexual Activity   Alcohol use: Yes    Alcohol/week: 1.0 - 2.0 standard drink of alcohol    Types: 1 - 2 Glasses of wine per week    Comment: ocassional glass of  wine   Drug use: No   Sexual activity: Yes    Birth control/protection: None  Other Topics Concern   Not on file  Social History Narrative   Not on file   Social Drivers of Health   Financial Resource Strain: Low Risk  (03/15/2022)   Overall Financial Resource Strain (CARDIA)    Difficulty of Paying Living Expenses: Not hard at all  Food Insecurity: No Food Insecurity (03/15/2022)   Hunger Vital Sign    Worried About Running Out of Food in the Last Year: Never true    Ran Out of Food in the Last Year: Never true  Transportation Needs: No Transportation Needs (03/15/2022)   PRAPARE - Administrator, Civil Service (Medical): No    Lack of Transportation (Non-Medical): No  Physical Activity:  Sufficiently Active (03/15/2022)   Exercise Vital Sign    Days of Exercise per Week: 4 days    Minutes of Exercise per Session: 40 min  Stress: No Stress Concern Present (03/15/2022)   [employer] of Occupational Health - Occupational Stress Questionnaire    Feeling of Stress : Not at all  Social Connections: Moderately Integrated (03/15/2022)   Social Connection and Isolation Panel [NHANES]    Frequency of Communication with Friends and Family: More than three times a week    Frequency of Social Gatherings with Friends and Family: More than three times a week    Attends Religious Services: More than 4 times per year    Active Member of [employer] or Organizations: Not on file    Attends Banker Meetings: Never    Marital Status: Married  Catering manager Violence: Not At Risk (03/15/2022)   Humiliation, Afraid, Rape, and Kick questionnaire    Fear of Current or Ex-Partner: No    Emotionally Abused: No    Physically Abused: No    Sexually Abused: No    Family History  Problem Relation Age of Onset   Prostate cancer Paternal Uncle    Bladder Cancer Neg Hx    Kidney cancer Neg Hx     Allergies  Allergen Reactions   Pollen Extract     seasonal   Sulfa Antibiotics Other (See Comments)    Dizziness    Sulfamethoxazole-Trimethoprim     dizzy    Benadryl [Diphenhydramine Hcl (Sleep)] Anxiety    Outpatient Medications Prior to Visit  Medication Sig   acetaminophen (TYLENOL) 500 MG tablet Take 500 mg by mouth every 6 (six) hours as needed.   aspirin EC 81 MG tablet Take 81 mg by mouth.   atorvastatin (LIPITOR) 10 MG tablet Take 1 tablet (10 mg total) by mouth daily.   B Complex Vitamins (B COMPLEX 1 PO) Take by mouth. Takes 2 a week   calcium carbonate (OSCAL) 1500 (600 Ca) MG TABS tablet Take by mouth.   calcium carbonate (TUMS - DOSED IN MG ELEMENTAL CALCIUM) 500 MG chewable tablet Chew 1 tablet by mouth as needed for indigestion or heartburn.    Cholecalciferol (VITAMIN D3) 2000 units capsule Take 2,000 Units by mouth every other day.   clonazePAM (KLONOPIN) 0.5 MG tablet Take 1 tablet (0.5 mg total) by mouth at bedtime.   Coenzyme Q10 100 MG capsule Take 200 mg by mouth. Takes 2 times week   esomeprazole (NEXIUM) 40 MG capsule Take 1 capsule (40 mg total) by mouth daily.   ferrous sulfate 324 MG TBEC Take 324 mg by mouth daily.   finasteride (PROSCAR) 5 MG  tablet TAKE 1 TABLET EVERY DAY   folic acid (FOLVITE) 400 MCG tablet Take 800 mcg by mouth. 2 times a week   lisinopril (ZESTRIL) 10 MG tablet Take 1 tablet (10 mg total) by mouth daily.   loratadine (CLARITIN) 10 MG tablet Take 10 mg by mouth daily as needed for allergies.   Melatonin 3 MG TABS Take by mouth.   metoprolol succinate (TOPROL-XL) 25 MG 24 hr tablet Take 0.5 tablets (12.5 mg total) by mouth 2 (two) times daily.   Omega-3 1000 MG CAPS Take 1,200 mg by mouth daily.   sildenafil (VIAGRA) 100 MG tablet TAKE ONE TABLET BY MOUTH ONE HOUR PRIOR TO INTERCOURSE AS NEEDED   vitamin C (ASCORBIC ACID) 500 MG tablet Take 500 mg by mouth.   Zinc 50 MG TABS Take 1 tablet by mouth every 14 (fourteen) days.   [DISCONTINUED] niacin (VITAMIN B3) 500 MG ER tablet Take 4 tablets (2,000 mg total) by mouth at bedtime.   No facility-administered medications prior to visit.    Review of Systems  Constitutional: Negative.   HENT: Negative.    Eyes: Negative.   Respiratory: Negative.    Cardiovascular: Negative.   Gastrointestinal:  Positive for heartburn.  Genitourinary:  Negative for frequency and urgency.       Nocturia  Musculoskeletal:  Positive for joint pain.  Skin: Negative.   Neurological: Negative.   Endo/Heme/Allergies: Negative.   Psychiatric/Behavioral:  The patient does not have insomnia.        Objective:   BP 118/70   Pulse 79   Ht 5\' 3"  (1.6 m)   Wt 165 lb (74.8 kg)   SpO2 96%   BMI 29.23 kg/m   Vitals:   03/21/23 1351  BP: 118/70  Pulse: 79   Height: 5\' 3"  (1.6 m)  Weight: 165 lb (74.8 kg)  SpO2: 96%  BMI (Calculated): 29.24    Physical Exam Vitals reviewed.  Constitutional:      Appearance: Normal appearance.  HENT:     Head: Normocephalic.     Left Ear: There is no impacted cerumen.     Nose: Nose normal.     Mouth/Throat:     Mouth: Mucous membranes are moist.     Pharynx: No posterior oropharyngeal erythema.  Eyes:     Extraocular Movements: Extraocular movements intact.     Pupils: Pupils are equal, round, and reactive to light.  Cardiovascular:     Rate and Rhythm: Regular rhythm.     Chest Wall: PMI is not displaced.     Pulses: Normal pulses.     Heart sounds: Murmur heard.     Crescendo decrescendo systolic murmur is present with a grade of 2/6.     Comments: Sternal incision stable Pulmonary:     Effort: Pulmonary effort is normal.     Breath sounds: Normal air entry. No rhonchi or rales.  Abdominal:     General: Abdomen is flat. Bowel sounds are normal. There is no distension.     Palpations: Abdomen is soft. There is no hepatomegaly, splenomegaly or mass.     Tenderness: There is no abdominal tenderness.  Musculoskeletal:        General: Normal range of motion.     Cervical back: Normal range of motion and neck supple.     Right lower leg: No edema.     Left lower leg: No edema.  Skin:    General: Skin is warm and dry.  Neurological:  General: No focal deficit present.     Mental Status: He is alert and oriented to person, place, and time.     Cranial Nerves: No cranial nerve deficit.     Motor: No weakness.  Psychiatric:        Mood and Affect: Mood normal.        Behavior: Behavior normal.      No results found for any visits on 03/21/23.  Recent Results (from the past 2160 hours)  Hemoglobin A1c     Status: None   Collection Time: 02/08/23  9:07 AM  Result Value Ref Range   Hgb A1c MFr Bld 5.5 4.8 - 5.6 %    Comment:          Prediabetes: 5.7 - 6.4          Diabetes: >6.4           Glycemic control for adults with diabetes: <7.0    Est. average glucose Bld gHb Est-mCnc 111 mg/dL  Lipid panel     Status: None   Collection Time: 02/08/23  9:07 AM  Result Value Ref Range   Cholesterol, Total 144 100 - 199 mg/dL   Triglycerides 85 0 - 149 mg/dL   HDL 70 >40 mg/dL   VLDL Cholesterol Cal 16 5 - 40 mg/dL   LDL Chol Calc (NIH) 58 0 - 99 mg/dL   Chol/HDL Ratio 2.1 0.0 - 5.0 ratio    Comment:                                   T. Chol/HDL Ratio                                             Men  Women                               1/2 Avg.Risk  3.4    3.3                                   Avg.Risk  5.0    4.4                                2X Avg.Risk  9.6    7.1                                3X Avg.Risk 23.4   11.0   Comprehensive metabolic panel     Status: None   Collection Time: 02/08/23  9:07 AM  Result Value Ref Range   Glucose 85 70 - 99 mg/dL   BUN 17 8 - 27 mg/dL   Creatinine, Ser 1.02 0.76 - 1.27 mg/dL   eGFR 64 >72 ZD/GUY/4.03   BUN/Creatinine Ratio 15 10 - 24   Sodium 137 134 - 144 mmol/L   Potassium 5.0 3.5 - 5.2 mmol/L   Chloride 101 96 - 106 mmol/L   CO2 24 20 - 29 mmol/L   Calcium 9.3 8.6 - 10.2 mg/dL   Total Protein 6.4 6.0 -  8.5 g/dL   Albumin 4.3 3.8 - 4.8 g/dL   Globulin, Total 2.1 1.5 - 4.5 g/dL   Bilirubin Total 0.4 0.0 - 1.2 mg/dL   Alkaline Phosphatase 83 44 - 121 IU/L   AST 22 0 - 40 IU/L   ALT 14 0 - 44 IU/L      Assessment & Plan:  As per problem list. Problem List Items Addressed This Visit       Cardiovascular and Mediastinum   Essential hypertension - Primary   Relevant Medications   niacin (VITAMIN B3) 500 MG ER tablet     Other   Hyperlipidemia   Relevant Medications   niacin (VITAMIN B3) 500 MG ER tablet   Prediabetes    Return in about 3 months (around 06/19/2023) for fu with labs prior.   Total time spent: 20 minutes  Luna Fuse, MD  03/21/2023   This document may have been prepared by  [facility] Voice Recognition software and as such may include unintentional dictation errors.

## 2023-03-22 ENCOUNTER — Ambulatory Visit: Payer: Medicare PPO

## 2023-03-22 DIAGNOSIS — I340 Nonrheumatic mitral (valve) insufficiency: Secondary | ICD-10-CM

## 2023-03-22 DIAGNOSIS — I252 Old myocardial infarction: Secondary | ICD-10-CM

## 2023-03-22 DIAGNOSIS — G4733 Obstructive sleep apnea (adult) (pediatric): Secondary | ICD-10-CM

## 2023-03-22 DIAGNOSIS — I351 Nonrheumatic aortic (valve) insufficiency: Secondary | ICD-10-CM | POA: Diagnosis not present

## 2023-03-22 DIAGNOSIS — R9431 Abnormal electrocardiogram [ECG] [EKG]: Secondary | ICD-10-CM

## 2023-03-22 DIAGNOSIS — R7303 Prediabetes: Secondary | ICD-10-CM

## 2023-03-22 DIAGNOSIS — R0602 Shortness of breath: Secondary | ICD-10-CM

## 2023-03-22 DIAGNOSIS — I2510 Atherosclerotic heart disease of native coronary artery without angina pectoris: Secondary | ICD-10-CM

## 2023-03-22 DIAGNOSIS — I10 Essential (primary) hypertension: Secondary | ICD-10-CM

## 2023-03-22 DIAGNOSIS — E782 Mixed hyperlipidemia: Secondary | ICD-10-CM

## 2023-03-24 ENCOUNTER — Ambulatory Visit (INDEPENDENT_AMBULATORY_CARE_PROVIDER_SITE_OTHER): Payer: Medicare PPO | Admitting: Cardiovascular Disease

## 2023-03-24 ENCOUNTER — Encounter: Payer: Self-pay | Admitting: Cardiovascular Disease

## 2023-03-24 VITALS — BP 146/80 | HR 73 | Ht 63.0 in | Wt 166.2 lb

## 2023-03-24 DIAGNOSIS — R9431 Abnormal electrocardiogram [ECG] [EKG]: Secondary | ICD-10-CM | POA: Diagnosis not present

## 2023-03-24 DIAGNOSIS — I10 Essential (primary) hypertension: Secondary | ICD-10-CM

## 2023-03-24 DIAGNOSIS — I252 Old myocardial infarction: Secondary | ICD-10-CM

## 2023-03-24 DIAGNOSIS — E782 Mixed hyperlipidemia: Secondary | ICD-10-CM | POA: Diagnosis not present

## 2023-03-24 DIAGNOSIS — R0602 Shortness of breath: Secondary | ICD-10-CM | POA: Diagnosis not present

## 2023-03-24 DIAGNOSIS — I259 Chronic ischemic heart disease, unspecified: Secondary | ICD-10-CM

## 2023-03-24 DIAGNOSIS — I2510 Atherosclerotic heart disease of native coronary artery without angina pectoris: Secondary | ICD-10-CM

## 2023-03-24 NOTE — Patient Instructions (Signed)
TAKE METOPROLOL 25 MG NIGHT BEFORE AND AGAIN IN MORNING OF CCTA

## 2023-03-24 NOTE — Progress Notes (Signed)
Cardiology Office Note   Date:  03/24/2023   ID:  Damu Madani, DOB Oct 02, 1945, MRN [id number]  PCP:  [name], MD  Cardiologist:  [name], MD      History of Present Illness: Bruce Gomez is a 77 y.o. male who presents for  Chief Complaint  Patient presents with   Follow-up    Echo and NST results    No complaints      Past Medical History:  Diagnosis Date   Actinic keratosis    Anemia    Aortic sclerosis 04/21/2017   Overview:  Moderate Aortic Sclerosis, mild LVH,  mild Mitral valve thickening, Slight LAE on ECHO - started  on ACE  for  LVH   Arthritis    hips and lower back   Basal cell carcinoma 04/26/2022   Right nose - EDC   BPH with obstruction/lower urinary tract symptoms 03/19/2014   BPH with obstruction/lower urinary tract symptoms 03/19/2014   CAD (coronary artery disease) 04/21/2017   Overview:   silent MI - Inferior; s/p CABG x 3; Stress Echo  6/05; 8/08;  2/12   Chronic prostatitis 11/03/2000   Coronary artery disease    Elevated PSA 03/19/2014   Erectile dysfunction 03/19/2014   History of basal cell carcinoma 07/02/2015   right nose   History of dysplastic nevus 06/22/2017   left ant deltoid   Hyperlipidemia 04/21/2017   Hypertension    Impaired glucose tolerance 04/21/2017   Myocardial infarction (HCC) 2002   OSA (obstructive sleep apnea) 04/21/2017   Overview:  rx with wt loss, side sleeping-no CPAP, not true sleep apnea   RLS (restless legs syndrome) 04/21/2017     Past Surgical History:  Procedure Laterality Date   ADENOIDECTOMY     CARDIAC SURGERY     cabg 2002   CATARACT EXTRACTION W/PHACO Right 07/26/2022   Procedure: CATARACT EXTRACTION PHACO AND INTRAOCULAR LENS PLACEMENT (IOC) RIGHT;  Surgeon: Nevada Crane, MD;  Location: [facility];  Service: Ophthalmology;  Laterality: Right;  9.76 0:59.2   CATARACT EXTRACTION W/PHACO Left 08/09/2022   Procedure: CATARACT EXTRACTION PHACO AND INTRAOCULAR  LENS PLACEMENT (IOC) LEFT;  Surgeon: Nevada Crane, MD;  Location: [facility];  Service: Ophthalmology;  Laterality: Left;  2.59  00:25.8   COLONOSCOPY WITH PROPOFOL N/A 12/14/2019   Procedure: COLONOSCOPY WITH PROPOFOL;  Surgeon: Midge Minium, MD;  Location: [facility];  Service: Endoscopy;  Laterality: N/A;  priority 4   CORONARY ARTERY BYPASS GRAFT  2002   x3   INGUINAL HERNIA REPAIR     POLYPECTOMY  12/14/2019   Procedure: POLYPECTOMY;  Surgeon: Midge Minium, MD;  Location: [facility];  Service: Endoscopy;;   right thumb pin Right 04/2021   TRANSURETHRAL RESECTION OF PROSTATE     2014     Current Outpatient Medications  Medication Sig Dispense Refill   acetaminophen (TYLENOL) 500 MG tablet Take 500 mg by mouth every 6 (six) hours as needed.     aspirin EC 81 MG tablet Take 81 mg by mouth.     atorvastatin (LIPITOR) 10 MG tablet Take 1 tablet (10 mg total) by mouth daily. 90 tablet 3   B Complex Vitamins (B COMPLEX 1 PO) Take by mouth. Takes 2 a week     calcium carbonate (OSCAL) 1500 (600 Ca) MG TABS tablet Take by mouth.     calcium carbonate (TUMS - DOSED IN MG ELEMENTAL CALCIUM) 500 MG chewable tablet Chew 1 tablet  by mouth as needed for indigestion or heartburn.     Cholecalciferol (VITAMIN D3) 2000 units capsule Take 2,000 Units by mouth every other day.     clonazePAM (KLONOPIN) 0.5 MG tablet Take 1 tablet (0.5 mg total) by mouth at bedtime. 30 tablet 2   Coenzyme Q10 100 MG capsule Take 200 mg by mouth. Takes 2 times week     esomeprazole (NEXIUM) 40 MG capsule Take 1 capsule (40 mg total) by mouth daily. 90 capsule 1   ferrous sulfate 324 MG TBEC Take 324 mg by mouth daily.     finasteride (PROSCAR) 5 MG tablet TAKE 1 TABLET EVERY DAY 90 tablet 3   folic acid (FOLVITE) 400 MCG tablet Take 800 mcg by mouth. 2 times a week     lisinopril (ZESTRIL) 10 MG tablet Take 1 tablet (10 mg total) by mouth daily. 90 tablet 1   loratadine (CLARITIN) 10 MG  tablet Take 10 mg by mouth daily as needed for allergies.     Melatonin 3 MG TABS Take by mouth.     metoprolol succinate (TOPROL-XL) 25 MG 24 hr tablet Take 0.5 tablets (12.5 mg total) by mouth 2 (two) times daily. 90 tablet 1   niacin (VITAMIN B3) 500 MG ER tablet Take 4 tablets (2,000 mg total) by mouth at bedtime. 360 tablet 1   Omega-3 1000 MG CAPS Take 1,200 mg by mouth daily.     sildenafil (VIAGRA) 100 MG tablet TAKE ONE TABLET BY MOUTH ONE HOUR PRIOR TO INTERCOURSE AS NEEDED 90 tablet 1   vitamin C (ASCORBIC ACID) 500 MG tablet Take 500 mg by mouth.     Zinc 50 MG TABS Take 1 tablet by mouth every 14 (fourteen) days.     No current facility-administered medications for this visit.    Allergies:   Pollen extract, Sulfa antibiotics, Sulfamethoxazole-trimethoprim, and Benadryl [diphenhydramine hcl (sleep)]    Social History:   reports that he has never smoked. He has never used smokeless tobacco. He reports current alcohol use of about 1.0 - 2.0 standard drink of alcohol per week. He reports that he does not use drugs.   Family History:  family history includes Prostate cancer in his paternal uncle.    ROS:     Review of Systems  Constitutional: Negative.   HENT: Negative.    Eyes: Negative.   Respiratory: Negative.    Gastrointestinal: Negative.   Genitourinary: Negative.   Musculoskeletal: Negative.   Skin: Negative.   Neurological: Negative.   Endo/Heme/Allergies: Negative.   Psychiatric/Behavioral: Negative.    All other systems reviewed and are negative.     All other systems are reviewed and negative.    PHYSICAL EXAM: VS:  BP (!) 146/80 (BP Location: Right Arm, Patient Position: Sitting, Cuff Size: Small)   Pulse 73   Ht 5\' 3"  (1.6 m)   Wt 166 lb 3.2 oz (75.4 kg)   SpO2 99%   BMI 29.44 kg/m  , BMI Body mass index is 29.44 kg/m. Last weight:  Wt Readings from Last 3 Encounters:  03/24/23 166 lb 3.2 oz (75.4 kg)  03/21/23 165 lb (74.8 kg)  02/28/23 165  lb 6.4 oz (75 kg)     Physical Exam Vitals reviewed.  Constitutional:      Appearance: Normal appearance. He is normal weight.  HENT:     Head: Normocephalic.     Nose: Nose normal.     Mouth/Throat:     Mouth: Mucous membranes are moist.  Eyes:     Pupils: Pupils are equal, round, and reactive to light.  Cardiovascular:     Rate and Rhythm: Normal rate and regular rhythm.     Pulses: Normal pulses.     Heart sounds: Normal heart sounds.  Pulmonary:     Effort: Pulmonary effort is normal.  Abdominal:     General: Abdomen is flat. Bowel sounds are normal.  Musculoskeletal:        General: Normal range of motion.     Cervical back: Normal range of motion.  Skin:    General: Skin is warm.  Neurological:     General: No focal deficit present.     Mental Status: He is alert.  Psychiatric:        Mood and Affect: Mood normal.       EKG:   Recent Labs: 11/05/2022: Hemoglobin 13.3; Platelets 166; TSH 1.010 02/08/2023: ALT 14; BUN 17; Creatinine, Ser 1.16; Potassium 5.0; Sodium 137    Lipid Panel    Component Value Date/Time   CHOL 144 02/08/2023 0907   TRIG 85 02/08/2023 0907   HDL 70 02/08/2023 0907   CHOLHDL 2.1 02/08/2023 0907   CHOLHDL 2.0 02/24/2022 0855   LDLCALC 58 02/08/2023 0907   LDLCALC 54 02/24/2022 0855      Other studies Reviewed: Additional studies/ records that were reviewed today include:  Review of the above records demonstrates:       No data to display            ASSESSMENT AND PLAN:    ICD-10-CM   1. SOB (shortness of breath)  R06.02 CT CORONARY MORPH W/CTA COR W/SCORE W/CA W/CM &/OR WO/CM   stress test shows inferior wall reverisble defect, advise CCTA    2. Essential hypertension  I10 CT CORONARY MORPH W/CTA COR W/SCORE W/CA W/CM &/OR WO/CM    3. Coronary artery disease with hx of myocardial infarct w/o hx of CABG  I25.10 CT CORONARY MORPH W/CTA COR W/SCORE W/CA W/CM &/OR WO/CM   I25.2     4. Mixed hyperlipidemia  E78.2 CT  CORONARY MORPH W/CTA COR W/SCORE W/CA W/CM &/OR WO/CM    5. Abnormal EKG  R94.31 CT CORONARY MORPH W/CTA COR W/SCORE W/CA W/CM &/OR WO/CM    6. Chest pain due to myocardial ischemia, unspecified ischemic chest pain type  I25.9 CT CORONARY MORPH W/CTA COR W/SCORE W/CA W/CM &/OR WO/CM       Problem List Items Addressed This Visit       Cardiovascular and Mediastinum   Essential hypertension   Relevant Orders   CT CORONARY MORPH W/CTA COR W/SCORE W/CA W/CM &/OR WO/CM     Other   Hyperlipidemia   Relevant Orders   CT CORONARY MORPH W/CTA COR W/SCORE W/CA W/CM &/OR WO/CM   Other Visit Diagnoses       SOB (shortness of breath)    -  Primary   stress test shows inferior wall reverisble defect, advise CCTA   Relevant Orders   CT CORONARY MORPH W/CTA COR W/SCORE W/CA W/CM &/OR WO/CM     Coronary artery disease with hx of myocardial infarct w/o hx of CABG       Relevant Orders   CT CORONARY MORPH W/CTA COR W/SCORE W/CA W/CM &/OR WO/CM     Abnormal EKG       Relevant Orders   CT CORONARY MORPH W/CTA COR W/SCORE W/CA W/CM &/OR WO/CM     Chest pain due to myocardial ischemia, unspecified  ischemic chest pain type       Relevant Orders   CT CORONARY MORPH W/CTA COR W/SCORE W/CA W/CM &/OR WO/CM          Disposition:   Return in about 4 weeks (around 04/21/2023) for CCTA and f/u.    Total time spent: 30 minutes  Signed,  [name], MD  03/24/2023 9:32 AM    [facility]

## 2023-04-19 ENCOUNTER — Ambulatory Visit (INDEPENDENT_AMBULATORY_CARE_PROVIDER_SITE_OTHER): Payer: Medicare PPO

## 2023-04-21 ENCOUNTER — Other Ambulatory Visit: Payer: Medicare PPO

## 2023-04-21 DIAGNOSIS — R972 Elevated prostate specific antigen [PSA]: Secondary | ICD-10-CM

## 2023-04-22 ENCOUNTER — Ambulatory Visit: Payer: Medicare PPO | Admitting: Cardiovascular Disease

## 2023-04-22 LAB — PSA: Prostate Specific Ag, Serum: 3.4 ng/mL (ref 0.0–4.0)

## 2023-04-25 ENCOUNTER — Ambulatory Visit (INDEPENDENT_AMBULATORY_CARE_PROVIDER_SITE_OTHER): Payer: Medicare PPO | Admitting: Urology

## 2023-04-25 ENCOUNTER — Encounter: Payer: Self-pay | Admitting: Urology

## 2023-04-25 VITALS — BP 167/81 | HR 78 | Ht 63.0 in | Wt 159.0 lb

## 2023-04-25 DIAGNOSIS — R972 Elevated prostate specific antigen [PSA]: Secondary | ICD-10-CM | POA: Diagnosis not present

## 2023-04-25 DIAGNOSIS — N5201 Erectile dysfunction due to arterial insufficiency: Secondary | ICD-10-CM | POA: Diagnosis not present

## 2023-04-25 DIAGNOSIS — N400 Enlarged prostate without lower urinary tract symptoms: Secondary | ICD-10-CM

## 2023-04-25 DIAGNOSIS — N138 Other obstructive and reflux uropathy: Secondary | ICD-10-CM

## 2023-04-25 MED ORDER — FINASTERIDE 5 MG PO TABS: ORAL_TABLET | ORAL | 3 refills | Status: DC

## 2023-04-25 MED ORDER — TADALAFIL 10 MG PO TABS: ORAL_TABLET | ORAL | 0 refills | Status: DC

## 2023-04-25 NOTE — Progress Notes (Signed)
I, Bruce Gomez, acting as a scribe for Riki Altes, MD., have documented all relevant documentation on the behalf of Riki Altes, MD, as directed by Riki Altes, MD while in the presence of Riki Altes, MD.  04/25/2023 4:51 PM   Bruce Gomez 1945/11/22 [id number]  Referring provider: Corky Downs, MD [address] [place],  [place] [address]  Chief Complaint  Patient presents with   Erectile Dysfunction   Urologic History: 1.  Elevated PSA-previously followed in [place].  Four prior prostate biopsies since the early 2000 with benign pathology and a baseline PSA between 11-13.   2.  BPH-urinary retention in 2014 and status post TURP by Dr. Williams Che with an incomplete resection.  He had resection of a large intravesical median lobe with 26 g resected.   3.  Erectile dysfunction.-On sildenafil  HPI: Bruce Gomez is a 78 y.o. male presents for annual follow-up.   His only complaint today is difficulty maintaining an erection with sildenafil and he was wondering if there are alternative treatments No bothersome LUTS Denies dysuria, gross hematuria Denies flank, abdominal or pelvic pain Remains on finasteride PSA update 04/21/23 3.4 (uncorrected)  PSA trend   Prostate Specific Ag, Serum  Latest Ref Rng 0.0 - 4.0 ng/mL  05/08/2021 3.8   05/13/2022 4.3 (H)   04/21/2023 3.4      PMH: Past Medical History:  Diagnosis Date   Actinic keratosis    Anemia    Aortic sclerosis 04/21/2017   Overview:  Moderate Aortic Sclerosis, mild LVH,  mild Mitral valve thickening, Slight LAE on ECHO - started  on ACE  for  LVH   Arthritis    hips and lower back   Basal cell carcinoma 04/26/2022   Right nose - EDC   BPH with obstruction/lower urinary tract symptoms 03/19/2014   BPH with obstruction/lower urinary tract symptoms 03/19/2014   CAD (coronary artery disease) 04/21/2017   Overview:   silent MI - Inferior; s/p CABG x 3; Stress Echo  6/05; 8/08;  2/12   Chronic  prostatitis 11/03/2000   Coronary artery disease    Elevated PSA 03/19/2014   Erectile dysfunction 03/19/2014   History of basal cell carcinoma 07/02/2015   right nose   History of dysplastic nevus 06/22/2017   left ant deltoid   Hyperlipidemia 04/21/2017   Hypertension    Impaired glucose tolerance 04/21/2017   Myocardial infarction (HCC) 2002   OSA (obstructive sleep apnea) 04/21/2017   Overview:  rx with wt loss, side sleeping-no CPAP, not true sleep apnea   RLS (restless legs syndrome) 04/21/2017    Surgical History: Past Surgical History:  Procedure Laterality Date   ADENOIDECTOMY     CARDIAC SURGERY     cabg 2002   CATARACT EXTRACTION W/PHACO Right 07/26/2022   Procedure: CATARACT EXTRACTION PHACO AND INTRAOCULAR LENS PLACEMENT (IOC) RIGHT;  Surgeon: Nevada Crane, MD;  Location: [facility];  Service: Ophthalmology;  Laterality: Right;  9.76 0:59.2   CATARACT EXTRACTION W/PHACO Left 08/09/2022   Procedure: CATARACT EXTRACTION PHACO AND INTRAOCULAR LENS PLACEMENT (IOC) LEFT;  Surgeon: Nevada Crane, MD;  Location: [facility];  Service: Ophthalmology;  Laterality: Left;  2.59  00:25.8   COLONOSCOPY WITH PROPOFOL N/A 12/14/2019   Procedure: COLONOSCOPY WITH PROPOFOL;  Surgeon: Midge Minium, MD;  Location: [facility];  Service: Endoscopy;  Laterality: N/A;  priority 4   CORONARY ARTERY BYPASS GRAFT  2002   x3   INGUINAL HERNIA  REPAIR     POLYPECTOMY  12/14/2019   Procedure: POLYPECTOMY;  Surgeon: Midge Minium, MD;  Location: [facility];  Service: Endoscopy;;   right thumb pin Right 04/2021   TRANSURETHRAL RESECTION OF PROSTATE     2014    Home Medications:  Allergies as of 04/25/2023       Reactions   Pollen Extract    seasonal   Sulfa Antibiotics Other (See Comments)   Dizziness   Sulfamethoxazole-trimethoprim    dizzy   Benadryl [diphenhydramine Hcl (sleep)] Anxiety        Medication List        Accurate as of  April 25, 2023  4:51 PM. If you have any questions, ask your nurse or doctor.          acetaminophen 500 MG tablet Commonly known as: TYLENOL Take 500 mg by mouth every 6 (six) hours as needed.   ascorbic acid 500 MG tablet Commonly known as: VITAMIN C Take 500 mg by mouth.   aspirin EC 81 MG tablet Take 81 mg by mouth.   atorvastatin 10 MG tablet Commonly known as: LIPITOR Take 1 tablet (10 mg total) by mouth daily.   B COMPLEX 1 PO Take by mouth. Takes 2 a week   calcium carbonate 1500 (600 Ca) MG Tabs tablet Commonly known as: OSCAL Take by mouth.   calcium carbonate 500 MG chewable tablet Commonly known as: TUMS - dosed in mg elemental calcium Chew 1 tablet by mouth as needed for indigestion or heartburn.   clonazePAM 0.5 MG tablet Commonly known as: KLONOPIN Take 1 tablet (0.5 mg total) by mouth at bedtime.   Coenzyme Q10 100 MG capsule Take 200 mg by mouth. Takes 2 times week   esomeprazole 40 MG capsule Commonly known as: NEXIUM Take 1 capsule (40 mg total) by mouth daily.   ferrous sulfate 324 MG Tbec Take 324 mg by mouth daily.   finasteride 5 MG tablet Commonly known as: PROSCAR TAKE 1 TABLET EVERY DAY   folic acid 400 MCG tablet Commonly known as: FOLVITE Take 800 mcg by mouth. 2 times a week   lisinopril 10 MG tablet Commonly known as: ZESTRIL Take 1 tablet (10 mg total) by mouth daily.   loratadine 10 MG tablet Commonly known as: CLARITIN Take 10 mg by mouth daily as needed for allergies.   melatonin 3 MG Tabs tablet Take by mouth.   metoprolol succinate 25 MG 24 hr tablet Commonly known as: TOPROL-XL Take 0.5 tablets (12.5 mg total) by mouth 2 (two) times daily.   niacin 500 MG ER tablet Commonly known as: VITAMIN B3 Take 4 tablets (2,000 mg total) by mouth at bedtime.   Omega-3 1000 MG Caps Take 1,200 mg by mouth daily.   sildenafil 100 MG tablet Commonly known as: VIAGRA TAKE ONE TABLET BY MOUTH ONE HOUR PRIOR TO  INTERCOURSE AS NEEDED   tadalafil 10 MG tablet Commonly known as: CIALIS Take 2 tablets 1 hour prior to intercourse Started by: Riki Altes   Vitamin D3 50 MCG (2000 UT) capsule Take 2,000 Units by mouth every other day.   Zinc 50 MG Tabs Take 1 tablet by mouth every 14 (fourteen) days.        Allergies:  Allergies  Allergen Reactions   Pollen Extract     seasonal   Sulfa Antibiotics Other (See Comments)    Dizziness    Sulfamethoxazole-Trimethoprim     dizzy    Benadryl [Diphenhydramine Hcl (  Sleep)] Anxiety    Family History: Family History  Problem Relation Age of Onset   Prostate cancer Paternal Uncle    Bladder Cancer Neg Hx    Kidney cancer Neg Hx     Social History:  reports that he has never smoked. He has never used smokeless tobacco. He reports current alcohol use of about 1.0 - 2.0 standard drink of alcohol per week. He reports that he does not use drugs.   Physical Exam: BP (!) 167/81   Pulse 78   Ht 5\' 3"  (1.6 m)   Wt 159 lb (72.1 kg)   BMI 28.17 kg/m   Constitutional:  Alert and oriented, No acute distress. HEENT: [place] AT, moist mucus membranes.  Trachea midline, no masses. Cardiovascular: No clubbing, cyanosis, or edema. Respiratory: Normal respiratory effort, no increased work of breathing. GI: Abdomen is soft, nontender, nondistended, no abdominal masses Skin: No rashes, bruises or suspicious lesions. Neurologic: Grossly intact, no focal deficits, moving all 4 extremities. Psychiatric: Normal mood and affect.   Assessment & Plan:    1. Elevated PSA Stable Continue annual follow-up  2. BPH without LUTS Continue finasteride for prophylactic benefit-refill sent to pharmacy.   3. Erectile dysfunction We discussed increasing sildenafil would have higher side effects. He was interested in a trial of tadalafil and Rx 10 mg tabs sent to pharmacy. Would start at 20 mg and he can increase to 30 mg.  We also discussed intracavernosal  injections and vacuum erection devices as second-line options and he was provided literature.  [facility] [address], [address] [place], [place] [address] [phone]

## 2023-05-03 ENCOUNTER — Ambulatory Visit: Payer: Medicare PPO

## 2023-05-03 ENCOUNTER — Other Ambulatory Visit: Payer: Self-pay | Admitting: Cardiovascular Disease

## 2023-05-03 DIAGNOSIS — I2510 Atherosclerotic heart disease of native coronary artery without angina pectoris: Secondary | ICD-10-CM

## 2023-05-03 DIAGNOSIS — E782 Mixed hyperlipidemia: Secondary | ICD-10-CM

## 2023-05-03 DIAGNOSIS — R0602 Shortness of breath: Secondary | ICD-10-CM

## 2023-05-03 DIAGNOSIS — R9431 Abnormal electrocardiogram [ECG] [EKG]: Secondary | ICD-10-CM | POA: Diagnosis not present

## 2023-05-03 DIAGNOSIS — I259 Chronic ischemic heart disease, unspecified: Secondary | ICD-10-CM

## 2023-05-03 DIAGNOSIS — I252 Old myocardial infarction: Secondary | ICD-10-CM | POA: Diagnosis not present

## 2023-05-03 DIAGNOSIS — I10 Essential (primary) hypertension: Secondary | ICD-10-CM

## 2023-05-03 MED ORDER — IOHEXOL 350 MG/ML SOLN
100.0000 mL | Freq: Once | INTRAVENOUS | Status: AC | PRN
  Administered 2023-05-03: 100 mL via INTRAVENOUS

## 2023-05-06 ENCOUNTER — Encounter: Payer: Self-pay | Admitting: Cardiovascular Disease

## 2023-05-06 ENCOUNTER — Ambulatory Visit (INDEPENDENT_AMBULATORY_CARE_PROVIDER_SITE_OTHER): Payer: Medicare PPO | Admitting: Cardiovascular Disease

## 2023-05-06 VITALS — BP 132/78 | HR 76 | Ht 63.0 in | Wt 162.0 lb

## 2023-05-06 DIAGNOSIS — I10 Essential (primary) hypertension: Secondary | ICD-10-CM

## 2023-05-06 DIAGNOSIS — I2583 Coronary atherosclerosis due to lipid rich plaque: Secondary | ICD-10-CM

## 2023-05-06 DIAGNOSIS — I252 Old myocardial infarction: Secondary | ICD-10-CM

## 2023-05-06 DIAGNOSIS — I2510 Atherosclerotic heart disease of native coronary artery without angina pectoris: Secondary | ICD-10-CM | POA: Diagnosis not present

## 2023-05-06 DIAGNOSIS — E782 Mixed hyperlipidemia: Secondary | ICD-10-CM

## 2023-05-06 DIAGNOSIS — R0602 Shortness of breath: Secondary | ICD-10-CM

## 2023-05-06 MED ORDER — ATORVASTATIN CALCIUM 80 MG PO TABS: 80.0000 mg | ORAL_TABLET | Freq: Every day | ORAL | 11 refills | Status: DC

## 2023-05-06 NOTE — Progress Notes (Signed)
Cardiology Office Note   Date:  05/06/2023   ID:  Bruce Gomez, DOB Jul 12, 1945, MRN [id number]  PCP:  [name], MD  Cardiologist:  [name], MD      History of Present Illness: Bruce Gomez is a 78 y.o. male who presents for  Chief Complaint  Patient presents with   Follow-up    CCTA not completed due to machine going down     Has scratchy throat      Past Medical History:  Diagnosis Date   Actinic keratosis    Anemia    Aortic sclerosis 04/21/2017   Overview:  Moderate Aortic Sclerosis, mild LVH,  mild Mitral valve thickening, Slight LAE on ECHO - started  on ACE  for  LVH   Arthritis    hips and lower back   Basal cell carcinoma 04/26/2022   Right nose - [facility]   BPH with obstruction/lower urinary tract symptoms 03/19/2014   BPH with obstruction/lower urinary tract symptoms 03/19/2014   CAD (coronary artery disease) 04/21/2017   Overview:   silent MI - Inferior; s/p CABG x 3; Stress Echo  6/05; 8/08;  2/12   Chronic prostatitis 11/03/2000   Coronary artery disease    Elevated PSA 03/19/2014   Erectile dysfunction 03/19/2014   History of basal cell carcinoma 07/02/2015   right nose   History of dysplastic nevus 06/22/2017   left ant deltoid   Hyperlipidemia 04/21/2017   Hypertension    Impaired glucose tolerance 04/21/2017   Myocardial infarction (HCC) 2002   OSA (obstructive sleep apnea) 04/21/2017   Overview:  rx with wt loss, side sleeping-no CPAP, not true sleep apnea   RLS (restless legs syndrome) 04/21/2017     Past Surgical History:  Procedure Laterality Date   ADENOIDECTOMY     CARDIAC SURGERY     cabg 2002   CATARACT EXTRACTION W/PHACO Right 07/26/2022   Procedure: CATARACT EXTRACTION PHACO AND INTRAOCULAR LENS PLACEMENT (IOC) RIGHT;  Surgeon: Nevada Crane, MD;  Location: [facility];  Service: Ophthalmology;  Laterality: Right;  9.76 0:59.2   CATARACT EXTRACTION W/PHACO Left 08/09/2022   Procedure: CATARACT  EXTRACTION PHACO AND INTRAOCULAR LENS PLACEMENT (IOC) LEFT;  Surgeon: Nevada Crane, MD;  Location: [facility];  Service: Ophthalmology;  Laterality: Left;  2.59  00:25.8   COLONOSCOPY WITH PROPOFOL N/A 12/14/2019   Procedure: COLONOSCOPY WITH PROPOFOL;  Surgeon: Midge Minium, MD;  Location: [facility];  Service: Endoscopy;  Laterality: N/A;  priority 4   CORONARY ARTERY BYPASS GRAFT  2002   x3   INGUINAL HERNIA REPAIR     POLYPECTOMY  12/14/2019   Procedure: POLYPECTOMY;  Surgeon: Midge Minium, MD;  Location: [facility];  Service: Endoscopy;;   right thumb pin Right 04/2021   TRANSURETHRAL RESECTION OF PROSTATE     2014     Current Outpatient Medications  Medication Sig Dispense Refill   acetaminophen (TYLENOL) 500 MG tablet Take 500 mg by mouth every 6 (six) hours as needed.     aspirin EC 81 MG tablet Take 81 mg by mouth.     atorvastatin (LIPITOR) 80 MG tablet Take 1 tablet (80 mg total) by mouth daily. 30 tablet 11   B Complex Vitamins (B COMPLEX 1 PO) Take by mouth. Takes 2 a week     calcium carbonate (OSCAL) 1500 (600 Ca) MG TABS tablet Take by mouth.     calcium carbonate (TUMS - DOSED IN MG ELEMENTAL CALCIUM) 500  MG chewable tablet Chew 1 tablet by mouth as needed for indigestion or heartburn.     clonazePAM (KLONOPIN) 0.5 MG tablet Take 1 tablet (0.5 mg total) by mouth at bedtime. 30 tablet 2   Coenzyme Q10 100 MG capsule Take 200 mg by mouth. Takes 2 times week     esomeprazole (NEXIUM) 40 MG capsule Take 1 capsule (40 mg total) by mouth daily. 90 capsule 1   ferrous sulfate 324 MG TBEC Take 324 mg by mouth daily.     finasteride (PROSCAR) 5 MG tablet TAKE 1 TABLET EVERY DAY 90 tablet 3   folic acid (FOLVITE) 400 MCG tablet Take 800 mcg by mouth. 2 times a week     lisinopril (ZESTRIL) 10 MG tablet Take 1 tablet (10 mg total) by mouth daily. 90 tablet 1   loratadine (CLARITIN) 10 MG tablet Take 10 mg by mouth daily as needed for allergies.      Melatonin 3 MG TABS Take by mouth.     metoprolol succinate (TOPROL-XL) 25 MG 24 hr tablet Take 0.5 tablets (12.5 mg total) by mouth 2 (two) times daily. 90 tablet 1   niacin (VITAMIN B3) 500 MG ER tablet Take 4 tablets (2,000 mg total) by mouth at bedtime. 360 tablet 1   Omega-3 1000 MG CAPS Take 1,200 mg by mouth daily.     sildenafil (VIAGRA) 100 MG tablet TAKE ONE TABLET BY MOUTH ONE HOUR PRIOR TO INTERCOURSE AS NEEDED 90 tablet 1   tadalafil (CIALIS) 10 MG tablet Take 2 tablets 1 hour prior to intercourse 10 tablet 0   vitamin C (ASCORBIC ACID) 500 MG tablet Take 500 mg by mouth.     Zinc 50 MG TABS Take 1 tablet by mouth every 14 (fourteen) days.     Cholecalciferol (VITAMIN D3) 2000 units capsule Take 2,000 Units by mouth every other day.     No current facility-administered medications for this visit.    Allergies:   Pollen extract, Sulfa antibiotics, Sulfamethoxazole-trimethoprim, and Benadryl [diphenhydramine hcl (sleep)]    Social History:   reports that he has never smoked. He has never used smokeless tobacco. He reports current alcohol use of about 1.0 - 2.0 standard drink of alcohol per week. He reports that he does not use drugs.   Family History:  family history includes Prostate cancer in his paternal uncle.    ROS:     Review of Systems  Constitutional: Negative.   HENT: Negative.    Eyes: Negative.   Respiratory: Negative.    Gastrointestinal: Negative.   Genitourinary: Negative.   Musculoskeletal: Negative.   Skin: Negative.   Neurological: Negative.   Endo/Heme/Allergies: Negative.   Psychiatric/Behavioral: Negative.    All other systems reviewed and are negative.     All other systems are reviewed and negative.    PHYSICAL EXAM: VS:  BP 132/78   Pulse 76   Ht 5\' 3"  (1.6 m)   Wt 162 lb (73.5 kg)   SpO2 97%   BMI 28.70 kg/m  , BMI Body mass index is 28.7 kg/m. Last weight:  Wt Readings from Last 3 Encounters:  05/06/23 162 lb (73.5 kg)   04/25/23 159 lb (72.1 kg)  03/24/23 166 lb 3.2 oz (75.4 kg)     Physical Exam Vitals reviewed.  Constitutional:      Appearance: Normal appearance. He is normal weight.  HENT:     Head: Normocephalic.     Nose: Nose normal.     Mouth/Throat:  Mouth: Mucous membranes are moist.  Eyes:     Pupils: Pupils are equal, round, and reactive to light.  Cardiovascular:     Rate and Rhythm: Normal rate and regular rhythm.     Pulses: Normal pulses.     Heart sounds: Normal heart sounds.  Pulmonary:     Effort: Pulmonary effort is normal.  Abdominal:     General: Abdomen is flat. Bowel sounds are normal.  Musculoskeletal:        General: Normal range of motion.     Cervical back: Normal range of motion.  Skin:    General: Skin is warm.  Neurological:     General: No focal deficit present.     Mental Status: He is alert.  Psychiatric:        Mood and Affect: Mood normal.       EKG:   Recent Labs: 11/05/2022: Hemoglobin 13.3; Platelets 166; TSH 1.010 02/08/2023: ALT 14; BUN 17; Creatinine, Ser 1.16; Potassium 5.0; Sodium 137    Lipid Panel    Component Value Date/Time   CHOL 144 02/08/2023 0907   TRIG 85 02/08/2023 0907   HDL 70 02/08/2023 0907   CHOLHDL 2.1 02/08/2023 0907   CHOLHDL 2.0 02/24/2022 0855   LDLCALC 58 02/08/2023 0907   LDLCALC 54 02/24/2022 0855      Other studies Reviewed: Additional studies/ records that were reviewed today include:  Review of the above records demonstrates:       No data to display            ASSESSMENT AND PLAN:    ICD-10-CM   1. Coronary artery disease due to lipid rich plaque  I25.10 atorvastatin (LIPITOR) 80 MG tablet   I25.83    Calcium score was 3790 but CTA was unable to be done.  Patient did have severe ischemia in the left circumflex and RCA territory but not having chest pain.    2. Essential hypertension  I10 atorvastatin (LIPITOR) 80 MG tablet    3. Hx of acute myocardial infarction  I25.2 atorvastatin  (LIPITOR) 80 MG tablet    4. Mixed hyperlipidemia  E78.2 atorvastatin (LIPITOR) 80 MG tablet   Due to calcium score being over 3000 will change Lipitor from 10 mg to 80 mg to prevent progression and cause regression of coronary artery disease.    5. SOB (shortness of breath)  R06.02    Has occasional shortness of breath on exertion but no chest pain.       Problem List Items Addressed This Visit       Cardiovascular and Mediastinum   CAD (coronary artery disease) - Primary   Relevant Medications   atorvastatin (LIPITOR) 80 MG tablet   Essential hypertension   Relevant Medications   atorvastatin (LIPITOR) 80 MG tablet     Other   Hyperlipidemia   Relevant Medications   atorvastatin (LIPITOR) 80 MG tablet   Hx of acute myocardial infarction   Relevant Medications   atorvastatin (LIPITOR) 80 MG tablet   Other Visit Diagnoses       SOB (shortness of breath)       Has occasional shortness of breath on exertion but no chest pain.          Disposition:   Return in about 6 weeks (around 06/17/2023).    Total time spent: 30 minutes  Signed,  [name], MD  05/06/2023 9:56 AM    [facility]

## 2023-05-13 ENCOUNTER — Other Ambulatory Visit: Payer: Medicare PPO

## 2023-05-16 ENCOUNTER — Ambulatory Visit: Payer: Medicare PPO | Admitting: Urology

## 2023-05-30 ENCOUNTER — Other Ambulatory Visit: Payer: Self-pay | Admitting: *Deleted

## 2023-05-30 ENCOUNTER — Encounter: Payer: Self-pay | Admitting: Urology

## 2023-05-30 MED ORDER — SILDENAFIL CITRATE 100 MG PO TABS: ORAL_TABLET | ORAL | 1 refills | Status: DC

## 2023-06-16 ENCOUNTER — Ambulatory Visit (INDEPENDENT_AMBULATORY_CARE_PROVIDER_SITE_OTHER): Payer: Medicare PPO | Admitting: Cardiovascular Disease

## 2023-06-16 ENCOUNTER — Encounter: Payer: Self-pay | Admitting: Cardiovascular Disease

## 2023-06-16 ENCOUNTER — Other Ambulatory Visit

## 2023-06-16 VITALS — BP 112/78 | HR 76 | Ht 63.0 in | Wt 159.0 lb

## 2023-06-16 DIAGNOSIS — I2583 Coronary atherosclerosis due to lipid rich plaque: Secondary | ICD-10-CM

## 2023-06-16 DIAGNOSIS — E782 Mixed hyperlipidemia: Secondary | ICD-10-CM | POA: Diagnosis not present

## 2023-06-16 DIAGNOSIS — I2510 Atherosclerotic heart disease of native coronary artery without angina pectoris: Secondary | ICD-10-CM | POA: Diagnosis not present

## 2023-06-16 DIAGNOSIS — I252 Old myocardial infarction: Secondary | ICD-10-CM

## 2023-06-16 DIAGNOSIS — R7303 Prediabetes: Secondary | ICD-10-CM

## 2023-06-16 DIAGNOSIS — R0602 Shortness of breath: Secondary | ICD-10-CM | POA: Diagnosis not present

## 2023-06-16 DIAGNOSIS — I10 Essential (primary) hypertension: Secondary | ICD-10-CM

## 2023-06-16 MED ORDER — ROSUVASTATIN CALCIUM 40 MG PO TABS: 40.0000 mg | ORAL_TABLET | Freq: Every day | ORAL | 2 refills | Status: DC

## 2023-06-16 NOTE — Progress Notes (Signed)
 Cardiology Office Note   Date:  06/16/2023   ID:  Bruce Gomez, DOB 1945-09-28, MRN [id number]  PCP:  [name], MD  Cardiologist:  [name], MD      History of Present Illness: Bruce Gomez is a 78 y.o. male who presents for  Chief Complaint  Patient presents with   Follow-up    6 Weeks Follow Up    Has cramps in legs, joints and toes after being on lipitor 80.      Past Medical History:  Diagnosis Date   Actinic keratosis    Anemia    Aortic sclerosis 04/21/2017   Overview:  Moderate Aortic Sclerosis, mild LVH,  mild Mitral valve thickening, Slight LAE on ECHO - started  on ACE  for  LVH   Arthritis    hips and lower back   Basal cell carcinoma 04/26/2022   Right nose - EDC   BPH with obstruction/lower urinary tract symptoms 03/19/2014   BPH with obstruction/lower urinary tract symptoms 03/19/2014   CAD (coronary artery disease) 04/21/2017   Overview:   silent MI - Inferior; s/p CABG x 3; Stress Echo  6/05; 8/08;  2/12   Chronic prostatitis 11/03/2000   Coronary artery disease    Elevated PSA 03/19/2014   Erectile dysfunction 03/19/2014   History of basal cell carcinoma 07/02/2015   right nose   History of dysplastic nevus 06/22/2017   left ant deltoid   Hyperlipidemia 04/21/2017   Hypertension    Impaired glucose tolerance 04/21/2017   Myocardial infarction (HCC) 2002   OSA (obstructive sleep apnea) 04/21/2017   Overview:  rx with wt loss, side sleeping-no CPAP, not true sleep apnea   RLS (restless legs syndrome) 04/21/2017     Past Surgical History:  Procedure Laterality Date   ADENOIDECTOMY     CARDIAC SURGERY     cabg 2002   CATARACT EXTRACTION W/PHACO Right 07/26/2022   Procedure: CATARACT EXTRACTION PHACO AND INTRAOCULAR LENS PLACEMENT (IOC) RIGHT;  Surgeon: Nevada Crane, MD;  Location: [facility];  Service: Ophthalmology;  Laterality: Right;  9.76 0:59.2   CATARACT EXTRACTION W/PHACO Left 08/09/2022    Procedure: CATARACT EXTRACTION PHACO AND INTRAOCULAR LENS PLACEMENT (IOC) LEFT;  Surgeon: Nevada Crane, MD;  Location: [facility];  Service: Ophthalmology;  Laterality: Left;  2.59  00:25.8   COLONOSCOPY WITH PROPOFOL N/A 12/14/2019   Procedure: COLONOSCOPY WITH PROPOFOL;  Surgeon: Midge Minium, MD;  Location: [facility];  Service: Endoscopy;  Laterality: N/A;  priority 4   CORONARY ARTERY BYPASS GRAFT  2002   x3   INGUINAL HERNIA REPAIR     POLYPECTOMY  12/14/2019   Procedure: POLYPECTOMY;  Surgeon: Midge Minium, MD;  Location: [facility];  Service: Endoscopy;;   right thumb pin Right 04/2021   TRANSURETHRAL RESECTION OF PROSTATE     2014     Current Outpatient Medications  Medication Sig Dispense Refill   rosuvastatin (CRESTOR) 40 MG tablet Take 1 tablet (40 mg total) by mouth daily. 30 tablet 2   acetaminophen (TYLENOL) 500 MG tablet Take 500 mg by mouth every 6 (six) hours as needed.     aspirin EC 81 MG tablet Take 81 mg by mouth.     B Complex Vitamins (B COMPLEX 1 PO) Take by mouth. Takes 2 a week     calcium carbonate (OSCAL) 1500 (600 Ca) MG TABS tablet Take by mouth.     calcium carbonate (TUMS - DOSED IN  MG ELEMENTAL CALCIUM) 500 MG chewable tablet Chew 1 tablet by mouth as needed for indigestion or heartburn.     Cholecalciferol (VITAMIN D3) 2000 units capsule Take 2,000 Units by mouth every other day.     clonazePAM (KLONOPIN) 0.5 MG tablet Take 1 tablet (0.5 mg total) by mouth at bedtime. 30 tablet 2   Coenzyme Q10 100 MG capsule Take 200 mg by mouth. Takes 2 times week     esomeprazole (NEXIUM) 40 MG capsule Take 1 capsule (40 mg total) by mouth daily. 90 capsule 1   ferrous sulfate 324 MG TBEC Take 324 mg by mouth daily.     finasteride (PROSCAR) 5 MG tablet TAKE 1 TABLET EVERY DAY 90 tablet 3   folic acid (FOLVITE) 400 MCG tablet Take 800 mcg by mouth. 2 times a week     lisinopril (ZESTRIL) 10 MG tablet Take 1 tablet (10 mg total) by  mouth daily. 90 tablet 1   loratadine (CLARITIN) 10 MG tablet Take 10 mg by mouth daily as needed for allergies.     Melatonin 3 MG TABS Take by mouth.     metoprolol succinate (TOPROL-XL) 25 MG 24 hr tablet Take 0.5 tablets (12.5 mg total) by mouth 2 (two) times daily. 90 tablet 1   niacin (VITAMIN B3) 500 MG ER tablet Take 4 tablets (2,000 mg total) by mouth at bedtime. 360 tablet 1   Omega-3 1000 MG CAPS Take 1,200 mg by mouth daily.     sildenafil (VIAGRA) 100 MG tablet TAKE ONE TABLET BY MOUTH ONE HOUR PRIOR TO INTERCOURSE AS NEEDED 90 tablet 1   tadalafil (CIALIS) 10 MG tablet Take 2 tablets 1 hour prior to intercourse 10 tablet 0   vitamin C (ASCORBIC ACID) 500 MG tablet Take 500 mg by mouth.     Zinc 50 MG TABS Take 1 tablet by mouth every 14 (fourteen) days.     No current facility-administered medications for this visit.    Allergies:   Pollen extract, Sulfa antibiotics, Sulfamethoxazole-trimethoprim, and Benadryl [diphenhydramine hcl (sleep)]    Social History:   reports that he has never smoked. He has never used smokeless tobacco. He reports current alcohol use of about 1.0 - 2.0 standard drink of alcohol per week. He reports that he does not use drugs.   Family History:  family history includes Prostate cancer in his paternal uncle.    ROS:     Review of Systems  Constitutional: Negative.   HENT: Negative.    Eyes: Negative.   Respiratory: Negative.    Gastrointestinal: Negative.   Genitourinary: Negative.   Musculoskeletal: Negative.   Skin: Negative.   Neurological: Negative.   Endo/Heme/Allergies: Negative.   Psychiatric/Behavioral: Negative.    All other systems reviewed and are negative.     All other systems are reviewed and negative.    PHYSICAL EXAM: VS:  BP 112/78   Pulse 76   Ht 5\' 3"  (1.6 m)   Wt 159 lb (72.1 kg)   SpO2 97%   BMI 28.17 kg/m  , BMI Body mass index is 28.17 kg/m. Last weight:  Wt Readings from Last 3 Encounters:  06/16/23  159 lb (72.1 kg)  05/06/23 162 lb (73.5 kg)  04/25/23 159 lb (72.1 kg)     Physical Exam Vitals reviewed.  Constitutional:      Appearance: Normal appearance. He is normal weight.  HENT:     Head: Normocephalic.     Nose: Nose normal.  Mouth/Throat:     Mouth: Mucous membranes are moist.  Eyes:     Pupils: Pupils are equal, round, and reactive to light.  Cardiovascular:     Rate and Rhythm: Normal rate and regular rhythm.     Pulses: Normal pulses.     Heart sounds: Normal heart sounds.  Pulmonary:     Effort: Pulmonary effort is normal.  Abdominal:     General: Abdomen is flat. Bowel sounds are normal.  Musculoskeletal:        General: Normal range of motion.     Cervical back: Normal range of motion.  Skin:    General: Skin is warm.  Neurological:     General: No focal deficit present.     Mental Status: He is alert.  Psychiatric:        Mood and Affect: Mood normal.       EKG:   Recent Labs: 11/05/2022: Hemoglobin 13.3; Platelets 166; TSH 1.010 02/08/2023: ALT 14; BUN 17; Creatinine, Ser 1.16; Potassium 5.0; Sodium 137    Lipid Panel    Component Value Date/Time   CHOL 144 02/08/2023 0907   TRIG 85 02/08/2023 0907   HDL 70 02/08/2023 0907   CHOLHDL 2.1 02/08/2023 0907   CHOLHDL 2.0 02/24/2022 0855   LDLCALC 58 02/08/2023 0907   LDLCALC 54 02/24/2022 0855      Other studies Reviewed: Additional studies/ records that were reviewed today include:  Review of the above records demonstrates:       No data to display            ASSESSMENT AND PLAN:    ICD-10-CM   1. Hx of acute myocardial infarction  I25.2 rosuvastatin (CRESTOR) 40 MG tablet   No chest pain or shortness of breath.    2. Coronary artery disease due to lipid rich plaque  I25.10 rosuvastatin (CRESTOR) 40 MG tablet   I25.83     3. Essential hypertension  I10 rosuvastatin (CRESTOR) 40 MG tablet    4. Mixed hyperlipidemia  E78.2 rosuvastatin (CRESTOR) 40 MG tablet   Patient is  having side effect with 80 mg of Lipitor with cramps and joint pain.  Will change to 40 of Crestor but initially take 20 mg once a day.    5. SOB (shortness of breath)  R06.02 rosuvastatin (CRESTOR) 40 MG tablet    6. Coronary artery disease with hx of myocardial infarct w/o hx of CABG  I25.10 rosuvastatin (CRESTOR) 40 MG tablet   I25.2    Calcium score was over 3000 but CTA was not done however not having chest pain.  He needs high intensity statins.       Problem List Items Addressed This Visit       Cardiovascular and Mediastinum   CAD (coronary artery disease)   Relevant Medications   rosuvastatin (CRESTOR) 40 MG tablet   Essential hypertension   Relevant Medications   rosuvastatin (CRESTOR) 40 MG tablet     Other   Hyperlipidemia   Relevant Medications   rosuvastatin (CRESTOR) 40 MG tablet   Hx of acute myocardial infarction - Primary   Relevant Medications   rosuvastatin (CRESTOR) 40 MG tablet   Other Visit Diagnoses       SOB (shortness of breath)       Relevant Medications   rosuvastatin (CRESTOR) 40 MG tablet     Coronary artery disease with hx of myocardial infarct w/o hx of CABG       Calcium score was over  3000 but CTA was not done however not having chest pain.  He needs high intensity statins.   Relevant Medications   rosuvastatin (CRESTOR) 40 MG tablet          Disposition:   Return in about 3 weeks (around 07/07/2023).    Total time spent: 30 minutes  Signed,  [name], MD  06/16/2023 9:51 AM    [facility]

## 2023-06-17 LAB — LIPID PANEL
Chol/HDL Ratio: 2 ratio (ref 0.0–5.0)
Cholesterol, Total: 127 mg/dL (ref 100–199)
HDL: 65 mg/dL (ref >39)
LDL Chol Calc (NIH): 46 mg/dL (ref 0–99)
Triglycerides: 81 mg/dL (ref 0–149)
VLDL Cholesterol Cal: 16 mg/dL (ref 5–40)

## 2023-06-17 LAB — CMP14+EGFR
ALT: 16 IU/L (ref 0–44)
AST: 24 IU/L (ref 0–40)
Albumin: 4.3 g/dL (ref 3.8–4.8)
Alkaline Phosphatase: 91 IU/L (ref 44–121)
BUN/Creatinine Ratio: 16 (ref 10–24)
BUN: 17 mg/dL (ref 8–27)
Bilirubin Total: 0.5 mg/dL (ref 0.0–1.2)
CO2: 23 mmol/L (ref 20–29)
Calcium: 9.6 mg/dL (ref 8.6–10.2)
Chloride: 101 mmol/L (ref 96–106)
Creatinine, Ser: 1.06 mg/dL (ref 0.76–1.27)
Globulin, Total: 2.3 g/dL (ref 1.5–4.5)
Glucose: 92 mg/dL (ref 70–99)
Potassium: 4.3 mmol/L (ref 3.5–5.2)
Sodium: 138 mmol/L (ref 134–144)
Total Protein: 6.6 g/dL (ref 6.0–8.5)
eGFR: 72 mL/min/{1.73_m2} (ref >59)

## 2023-06-17 LAB — HEMOGLOBIN A1C
Est. average glucose Bld gHb Est-mCnc: 117 mg/dL
Hgb A1c MFr Bld: 5.7 % — ABNORMAL HIGH (ref 4.8–5.6)

## 2023-06-17 LAB — TSH: TSH: 0.82 u[IU]/mL (ref 0.450–4.500)

## 2023-06-21 ENCOUNTER — Ambulatory Visit (INDEPENDENT_AMBULATORY_CARE_PROVIDER_SITE_OTHER): Payer: Medicare PPO | Admitting: Internal Medicine

## 2023-06-21 ENCOUNTER — Encounter: Payer: Self-pay | Admitting: Internal Medicine

## 2023-06-21 VITALS — BP 109/62 | HR 67 | Temp 97.3°F | Ht 63.0 in | Wt 163.0 lb

## 2023-06-21 DIAGNOSIS — I2510 Atherosclerotic heart disease of native coronary artery without angina pectoris: Secondary | ICD-10-CM | POA: Diagnosis not present

## 2023-06-21 DIAGNOSIS — I252 Old myocardial infarction: Secondary | ICD-10-CM | POA: Diagnosis not present

## 2023-06-21 DIAGNOSIS — E782 Mixed hyperlipidemia: Secondary | ICD-10-CM | POA: Diagnosis not present

## 2023-06-21 DIAGNOSIS — F419 Anxiety disorder, unspecified: Secondary | ICD-10-CM

## 2023-06-21 DIAGNOSIS — I10 Essential (primary) hypertension: Secondary | ICD-10-CM | POA: Diagnosis not present

## 2023-06-21 MED ORDER — CLONAZEPAM 0.5 MG PO TABS: 0.5000 mg | ORAL_TABLET | Freq: Every day | ORAL | 2 refills | Status: DC

## 2023-06-21 NOTE — Progress Notes (Signed)
 Established Patient Office Visit  Subjective:  Patient ID: Bruce Gomez, male    DOB: January 26, 1946  Age: 78 y.o. MRN: [id number]  Chief Complaint  Patient presents with   Follow-up    3 month follow up Labs done prior     No new complaints, here for lab review and medication refills. LDL and TC well controlled on lab review. Triglycerides also satisfactory while CMP unremarkable.      No other concerns at this time.   Past Medical History:  Diagnosis Date   Actinic keratosis    Anemia    Aortic sclerosis 04/21/2017   Overview:  Moderate Aortic Sclerosis, mild LVH,  mild Mitral valve thickening, Slight LAE on ECHO - started  on ACE  for  LVH   Arthritis    hips and lower back   Basal cell carcinoma 04/26/2022   Right nose - EDC   BPH with obstruction/lower urinary tract symptoms 03/19/2014   BPH with obstruction/lower urinary tract symptoms 03/19/2014   CAD (coronary artery disease) 04/21/2017   Overview:   silent MI - Inferior; Garrie Woodin/p CABG x 3; Stress Echo  6/05; 8/08;  2/12   Chronic prostatitis 11/03/2000   Coronary artery disease    Elevated PSA 03/19/2014   Erectile dysfunction 03/19/2014   History of basal cell carcinoma 07/02/2015   right nose   History of dysplastic nevus 06/22/2017   left ant deltoid   Hyperlipidemia 04/21/2017   Hypertension    Impaired glucose tolerance 04/21/2017   Myocardial infarction (HCC) 2002   OSA (obstructive sleep apnea) 04/21/2017   Overview:  rx with wt loss, side sleeping-no CPAP, not true sleep apnea   RLS (restless legs syndrome) 04/21/2017    Past Surgical History:  Procedure Laterality Date   ADENOIDECTOMY     CARDIAC SURGERY     cabg 2002   CATARACT EXTRACTION W/PHACO Right 07/26/2022   Procedure: CATARACT EXTRACTION PHACO AND INTRAOCULAR LENS PLACEMENT (IOC) RIGHT;  Surgeon: Nevada Crane, MD;  Location: [facility];  Service: Ophthalmology;  Laterality: Right;  9.76 0:59.2   CATARACT EXTRACTION W/PHACO  Left 08/09/2022   Procedure: CATARACT EXTRACTION PHACO AND INTRAOCULAR LENS PLACEMENT (IOC) LEFT;  Surgeon: Nevada Crane, MD;  Location: [facility];  Service: Ophthalmology;  Laterality: Left;  2.59  00:25.8   COLONOSCOPY WITH PROPOFOL N/A 12/14/2019   Procedure: COLONOSCOPY WITH PROPOFOL;  Surgeon: Midge Minium, MD;  Location: [facility];  Service: Endoscopy;  Laterality: N/A;  priority 4   CORONARY ARTERY BYPASS GRAFT  2002   x3   INGUINAL HERNIA REPAIR     POLYPECTOMY  12/14/2019   Procedure: POLYPECTOMY;  Surgeon: Midge Minium, MD;  Location: [facility];  Service: Endoscopy;;   right thumb pin Right 04/2021   TRANSURETHRAL RESECTION OF PROSTATE     2014    Social History   Socioeconomic History   Marital status: Married    Spouse name: Not on file   Number of children: Not on file   Years of education: Not on file   Highest education level: Not on file  Occupational History   Not on file  Tobacco Use   Smoking status: Never   Smokeless tobacco: Never  Vaping Use   Vaping status: Never Used  Substance and Sexual Activity   Alcohol use: Yes    Alcohol/week: 1.0 - 2.0 standard drink of alcohol    Types: 1 - 2 Glasses of wine per week  Comment: ocassional glass of wine   Drug use: No   Sexual activity: Yes    Birth control/protection: None  Other Topics Concern   Not on file  Social History Narrative   Not on file   Social Drivers of Health   Financial Resource Strain: Low Risk  (03/15/2022)   Overall Financial Resource Strain (CARDIA)    Difficulty of Paying Living Expenses: Not hard at all  Food Insecurity: No Food Insecurity (03/15/2022)   Hunger Vital Sign    Worried About Running Out of Food in the Last Year: Never true    Ran Out of Food in the Last Year: Never true  Transportation Needs: No Transportation Needs (03/15/2022)   PRAPARE - Administrator, Civil Service (Medical): No    Lack of Transportation  (Non-Medical): No  Physical Activity: Sufficiently Active (03/15/2022)   Exercise Vital Sign    Days of Exercise per Week: 4 days    Minutes of Exercise per Session: 40 min  Stress: No Stress Concern Present (03/15/2022)   [employer] of Occupational Health - Occupational Stress Questionnaire    Feeling of Stress : Not at all  Social Connections: Moderately Integrated (03/15/2022)   Social Connection and Isolation Panel [NHANES]    Frequency of Communication with Friends and Family: More than three times a week    Frequency of Social Gatherings with Friends and Family: More than three times a week    Attends Religious Services: More than 4 times per year    Active Member of [employer] or Organizations: Not on file    Attends Banker Meetings: Never    Marital Status: Married  Catering manager Violence: Not At Risk (03/15/2022)   Humiliation, Afraid, Rape, and Kick questionnaire    Fear of Current or Ex-Partner: No    Emotionally Abused: No    Physically Abused: No    Sexually Abused: No    Family History  Problem Relation Age of Onset   Prostate cancer Paternal Uncle    Bladder Cancer Neg Hx    Kidney cancer Neg Hx     Allergies  Allergen Reactions   Pollen Extract     seasonal   Sulfa Antibiotics Other (See Comments)    Dizziness    Sulfamethoxazole-Trimethoprim     dizzy    Benadryl [Diphenhydramine Hcl (Sleep)] Anxiety    Outpatient Medications Prior to Visit  Medication Sig   acetaminophen (TYLENOL) 500 MG tablet Take 500 mg by mouth every 6 (six) hours as needed.   aspirin EC 81 MG tablet Take 81 mg by mouth.   B Complex Vitamins (B COMPLEX 1 PO) Take by mouth. Takes 2 a week   calcium carbonate (OSCAL) 1500 (600 Ca) MG TABS tablet Take by mouth.   calcium carbonate (TUMS - DOSED IN MG ELEMENTAL CALCIUM) 500 MG chewable tablet Chew 1 tablet by mouth as needed for indigestion or heartburn.   Cholecalciferol (VITAMIN D3) 2000 units capsule Take  2,000 Units by mouth every other day.   clonazePAM (KLONOPIN) 0.5 MG tablet Take 1 tablet (0.5 mg total) by mouth at bedtime.   Coenzyme Q10 100 MG capsule Take 200 mg by mouth. Takes 2 times week   esomeprazole (NEXIUM) 40 MG capsule Take 1 capsule (40 mg total) by mouth daily.   ferrous sulfate 324 MG TBEC Take 324 mg by mouth daily.   finasteride (PROSCAR) 5 MG tablet TAKE 1 TABLET EVERY DAY   folic acid (FOLVITE) 400  MCG tablet Take 800 mcg by mouth. 2 times a week   lisinopril (ZESTRIL) 10 MG tablet Take 1 tablet (10 mg total) by mouth daily.   loratadine (CLARITIN) 10 MG tablet Take 10 mg by mouth daily as needed for allergies.   Melatonin 3 MG TABS Take by mouth.   metoprolol succinate (TOPROL-XL) 25 MG 24 hr tablet Take 0.5 tablets (12.5 mg total) by mouth 2 (two) times daily.   niacin (VITAMIN B3) 500 MG ER tablet Take 4 tablets (2,000 mg total) by mouth at bedtime.   Omega-3 1000 MG CAPS Take 1,200 mg by mouth daily.   rosuvastatin (CRESTOR) 40 MG tablet Take 1 tablet (40 mg total) by mouth daily.   sildenafil (VIAGRA) 100 MG tablet TAKE ONE TABLET BY MOUTH ONE HOUR PRIOR TO INTERCOURSE AS NEEDED   tadalafil (CIALIS) 10 MG tablet Take 2 tablets 1 hour prior to intercourse   vitamin C (ASCORBIC ACID) 500 MG tablet Take 500 mg by mouth.   Zinc 50 MG TABS Take 1 tablet by mouth every 14 (fourteen) days.   No facility-administered medications prior to visit.    Review of Systems  Constitutional: Negative.   HENT: Negative.    Eyes: Negative.   Respiratory: Negative.    Gastrointestinal: Negative.   Genitourinary: Negative.   Musculoskeletal: Negative.   Skin: Negative.   Neurological: Negative.   Endo/Heme/Allergies: Negative.   Psychiatric/Behavioral: Negative.    All other systems reviewed and are negative.      Objective:   BP 109/62   Pulse 67   Temp (!) 97.3 F (36.3 C)   Ht 5\' 3"  (1.6 m)   Wt 163 lb (73.9 kg)   SpO2 95%   BMI 28.87 kg/m   Vitals:    06/21/23 1130  BP: 109/62  Pulse: 67  Temp: (!) 97.3 F (36.3 C)  Height: 5\' 3"  (1.6 m)  Weight: 163 lb (73.9 kg)  SpO2: 95%  BMI (Calculated): 28.88    Physical Exam Vitals reviewed.  Constitutional:      Appearance: Normal appearance. He is normal weight.  HENT:     Head: Normocephalic.     Nose: Nose normal.     Mouth/Throat:     Mouth: Mucous membranes are moist.  Eyes:     Pupils: Pupils are equal, round, and reactive to light.  Cardiovascular:     Rate and Rhythm: Normal rate and regular rhythm.     Pulses: Normal pulses.     Heart sounds: Normal heart sounds.  Pulmonary:     Effort: Pulmonary effort is normal.  Abdominal:     General: Abdomen is flat. Bowel sounds are normal.  Musculoskeletal:        General: Normal range of motion.     Cervical back: Normal range of motion.  Skin:    General: Skin is warm.  Neurological:     General: No focal deficit present.     Mental Status: He is alert.  Psychiatric:        Mood and Affect: Mood normal.      No results found for any visits on 06/21/23.  Recent Results (from the past 2160 hours)  PSA     Status: None   Collection Time: 04/21/23 10:30 AM  Result Value Ref Range   Prostate Specific Ag, Serum 3.4 0.0 - 4.0 ng/mL    Comment: Roche ECLIA methodology. According to the American Urological Association, Serum PSA should decrease and remain at undetectable levels  after radical prostatectomy. The AUA defines biochemical recurrence as an initial PSA value 0.2 ng/mL or greater followed by a subsequent confirmatory PSA value 0.2 ng/mL or greater. Values obtained with different assay methods or kits cannot be used interchangeably. Results cannot be interpreted as absolute evidence of the presence or absence of malignant disease.   Hemoglobin A1c     Status: Abnormal   Collection Time: 06/16/23  8:43 AM  Result Value Ref Range   Hgb A1c MFr Bld 5.7 (H) 4.8 - 5.6 %    Comment:          Prediabetes: 5.7 -  6.4          Diabetes: >6.4          Glycemic control for adults with diabetes: <7.0    Est. average glucose Bld gHb Est-mCnc 117 mg/dL  TSH     Status: None   Collection Time: 06/16/23  8:43 AM  Result Value Ref Range   TSH 0.820 0.450 - 4.500 uIU/mL  CMP14+EGFR     Status: None   Collection Time: 06/16/23  8:43 AM  Result Value Ref Range   Glucose 92 70 - 99 mg/dL   BUN 17 8 - 27 mg/dL   Creatinine, Ser 2.44 0.76 - 1.27 mg/dL   eGFR 72 >01 UU/VOZ/3.66   BUN/Creatinine Ratio 16 10 - 24   Sodium 138 134 - 144 mmol/L   Potassium 4.3 3.5 - 5.2 mmol/L   Chloride 101 96 - 106 mmol/L   CO2 23 20 - 29 mmol/L   Calcium 9.6 8.6 - 10.2 mg/dL   Total Protein 6.6 6.0 - 8.5 g/dL   Albumin 4.3 3.8 - 4.8 g/dL   Globulin, Total 2.3 1.5 - 4.5 g/dL   Bilirubin Total 0.5 0.0 - 1.2 mg/dL   Alkaline Phosphatase 91 44 - 121 IU/L   AST 24 0 - 40 IU/L   ALT 16 0 - 44 IU/L  Lipid panel     Status: None   Collection Time: 06/16/23  8:43 AM  Result Value Ref Range   Cholesterol, Total 127 100 - 199 mg/dL   Triglycerides 81 0 - 149 mg/dL   HDL 65 >44 mg/dL   VLDL Cholesterol Cal 16 5 - 40 mg/dL   LDL Chol Calc (NIH) 46 0 - 99 mg/dL   Chol/HDL Ratio 2.0 0.0 - 5.0 ratio    Comment:                                   T. Chol/HDL Ratio                                             Men  Women                               1/2 Avg.Risk  3.4    3.3                                   Avg.Risk  5.0    4.4  2X Avg.Risk  9.6    7.1                                3X Avg.Risk 23.4   11.0       Assessment & Plan:  As per problem list  Problem List Items Addressed This Visit   None   No follow-ups on file.   Total time spent: 20 minutes  Luna Fuse, MD  06/21/2023   This document may have been prepared by [facility] Voice Recognition software and as such may include unintentional dictation errors.

## 2023-06-28 ENCOUNTER — Ambulatory Visit: Payer: Medicare PPO | Admitting: Internal Medicine

## 2023-07-07 ENCOUNTER — Ambulatory Visit (INDEPENDENT_AMBULATORY_CARE_PROVIDER_SITE_OTHER): Admitting: Cardiovascular Disease

## 2023-07-07 ENCOUNTER — Encounter: Payer: Self-pay | Admitting: Cardiovascular Disease

## 2023-07-07 VITALS — BP 140/89 | HR 70 | Ht 63.0 in | Wt 163.8 lb

## 2023-07-07 DIAGNOSIS — E782 Mixed hyperlipidemia: Secondary | ICD-10-CM | POA: Diagnosis not present

## 2023-07-07 DIAGNOSIS — I2510 Atherosclerotic heart disease of native coronary artery without angina pectoris: Secondary | ICD-10-CM

## 2023-07-07 DIAGNOSIS — R0602 Shortness of breath: Secondary | ICD-10-CM | POA: Diagnosis not present

## 2023-07-07 DIAGNOSIS — I252 Old myocardial infarction: Secondary | ICD-10-CM | POA: Diagnosis not present

## 2023-07-07 DIAGNOSIS — I2583 Coronary atherosclerosis due to lipid rich plaque: Secondary | ICD-10-CM | POA: Diagnosis not present

## 2023-07-07 DIAGNOSIS — I10 Essential (primary) hypertension: Secondary | ICD-10-CM

## 2023-07-07 NOTE — Progress Notes (Signed)
 Cardiology Office Note   Date:  07/07/2023   ID:  Bruce Gomez, DOB October 06, 1945, MRN [id number]  PCP:  [name], MD  Cardiologist:  [name], MD      History of Present Illness: Bruce Gomez is a 78 y.o. male who presents for  Chief Complaint  Patient presents with   Follow-up    3 week follow up     Occasional SOB      Past Medical History:  Diagnosis Date   Actinic keratosis    Anemia    Aortic sclerosis 04/21/2017   Overview:  Moderate Aortic Sclerosis, mild LVH,  mild Mitral valve thickening, Slight LAE on ECHO - started  on ACE  for  LVH   Arthritis    hips and lower back   Basal cell carcinoma 04/26/2022   Right nose - EDC   BPH with obstruction/lower urinary tract symptoms 03/19/2014   BPH with obstruction/lower urinary tract symptoms 03/19/2014   CAD (coronary artery disease) 04/21/2017   Overview:   silent MI - Inferior; s/p CABG x 3; Stress Echo  6/05; 8/08;  2/12   Chronic prostatitis 11/03/2000   Coronary artery disease    Elevated PSA 03/19/2014   Erectile dysfunction 03/19/2014   History of basal cell carcinoma 07/02/2015   right nose   History of dysplastic nevus 06/22/2017   left ant deltoid   Hyperlipidemia 04/21/2017   Hypertension    Impaired glucose tolerance 04/21/2017   Myocardial infarction (HCC) 2002   OSA (obstructive sleep apnea) 04/21/2017   Overview:  rx with wt loss, side sleeping-no CPAP, not true sleep apnea   RLS (restless legs syndrome) 04/21/2017     Past Surgical History:  Procedure Laterality Date   ADENOIDECTOMY     CARDIAC SURGERY     cabg 2002   CATARACT EXTRACTION W/PHACO Right 07/26/2022   Procedure: CATARACT EXTRACTION PHACO AND INTRAOCULAR LENS PLACEMENT (IOC) RIGHT;  Surgeon: Nevada Crane, MD;  Location: [facility];  Service: Ophthalmology;  Laterality: Right;  9.76 0:59.2   CATARACT EXTRACTION W/PHACO Left 08/09/2022   Procedure: CATARACT EXTRACTION PHACO AND INTRAOCULAR  LENS PLACEMENT (IOC) LEFT;  Surgeon: Nevada Crane, MD;  Location: [facility];  Service: Ophthalmology;  Laterality: Left;  2.59  00:25.8   COLONOSCOPY WITH PROPOFOL N/A 12/14/2019   Procedure: COLONOSCOPY WITH PROPOFOL;  Surgeon: Midge Minium, MD;  Location: [facility];  Service: Endoscopy;  Laterality: N/A;  priority 4   CORONARY ARTERY BYPASS GRAFT  2002   x3   INGUINAL HERNIA REPAIR     POLYPECTOMY  12/14/2019   Procedure: POLYPECTOMY;  Surgeon: Midge Minium, MD;  Location: [facility];  Service: Endoscopy;;   right thumb pin Right 04/2021   TRANSURETHRAL RESECTION OF PROSTATE     2014     Current Outpatient Medications  Medication Sig Dispense Refill   acetaminophen (TYLENOL) 500 MG tablet Take 500 mg by mouth every 6 (six) hours as needed.     aspirin EC 81 MG tablet Take 81 mg by mouth.     B Complex Vitamins (B COMPLEX 1 PO) Take by mouth. Takes 2 a week     calcium carbonate (OSCAL) 1500 (600 Ca) MG TABS tablet Take by mouth.     calcium carbonate (TUMS - DOSED IN MG ELEMENTAL CALCIUM) 500 MG chewable tablet Chew 1 tablet by mouth as needed for indigestion or heartburn.     Cholecalciferol (VITAMIN D3) 2000 units capsule  Take 2,000 Units by mouth every other day.     clonazePAM (KLONOPIN) 0.5 MG tablet Take 1 tablet (0.5 mg total) by mouth at bedtime. 30 tablet 2   Coenzyme Q10 100 MG capsule Take 200 mg by mouth. Takes 2 times week     esomeprazole (NEXIUM) 40 MG capsule Take 1 capsule (40 mg total) by mouth daily. 90 capsule 1   ferrous sulfate 324 MG TBEC Take 324 mg by mouth daily.     finasteride (PROSCAR) 5 MG tablet TAKE 1 TABLET EVERY DAY 90 tablet 3   folic acid (FOLVITE) 400 MCG tablet Take 800 mcg by mouth. 2 times a week     lisinopril (ZESTRIL) 10 MG tablet Take 1 tablet (10 mg total) by mouth daily. 90 tablet 1   loratadine (CLARITIN) 10 MG tablet Take 10 mg by mouth daily as needed for allergies.     Melatonin 3 MG TABS Take by  mouth.     metoprolol succinate (TOPROL-XL) 25 MG 24 hr tablet Take 0.5 tablets (12.5 mg total) by mouth 2 (two) times daily. 90 tablet 1   niacin (VITAMIN B3) 500 MG ER tablet Take 4 tablets (2,000 mg total) by mouth at bedtime. 360 tablet 1   Omega-3 1000 MG CAPS Take 1,200 mg by mouth daily.     rosuvastatin (CRESTOR) 40 MG tablet Take 1 tablet (40 mg total) by mouth daily. 30 tablet 2   sildenafil (VIAGRA) 100 MG tablet TAKE ONE TABLET BY MOUTH ONE HOUR PRIOR TO INTERCOURSE AS NEEDED 90 tablet 1   tadalafil (CIALIS) 10 MG tablet Take 2 tablets 1 hour prior to intercourse 10 tablet 0   vitamin C (ASCORBIC ACID) 500 MG tablet Take 500 mg by mouth.     Zinc 50 MG TABS Take 1 tablet by mouth every 14 (fourteen) days.     No current facility-administered medications for this visit.    Allergies:   Pollen extract, Sulfa antibiotics, Sulfamethoxazole-trimethoprim, and Benadryl [diphenhydramine hcl (sleep)]    Social History:   reports that he has never smoked. He has never used smokeless tobacco. He reports current alcohol use of about 1.0 - 2.0 standard drink of alcohol per week. He reports that he does not use drugs.   Family History:  family history includes Prostate cancer in his paternal uncle.    ROS:     Review of Systems  Constitutional: Negative.   HENT: Negative.    Eyes: Negative.   Respiratory: Negative.    Gastrointestinal: Negative.   Genitourinary: Negative.   Musculoskeletal: Negative.   Skin: Negative.   Neurological: Negative.   Endo/Heme/Allergies: Negative.   Psychiatric/Behavioral: Negative.    All other systems reviewed and are negative.     All other systems are reviewed and negative.    PHYSICAL EXAM: VS:  BP (!) 140/89   Pulse 70   Ht 5\' 3"  (1.6 m)   Wt 163 lb 12.8 oz (74.3 kg)   SpO2 98%   BMI 29.02 kg/m  , BMI Body mass index is 29.02 kg/m. Last weight:  Wt Readings from Last 3 Encounters:  07/07/23 163 lb 12.8 oz (74.3 kg)  06/21/23 163 lb  (73.9 kg)  06/16/23 159 lb (72.1 kg)     Physical Exam Vitals reviewed.  Constitutional:      Appearance: Normal appearance. He is normal weight.  HENT:     Head: Normocephalic.     Nose: Nose normal.     Mouth/Throat:  Mouth: Mucous membranes are moist.  Eyes:     Pupils: Pupils are equal, round, and reactive to light.  Cardiovascular:     Rate and Rhythm: Normal rate and regular rhythm.     Pulses: Normal pulses.     Heart sounds: Normal heart sounds.  Pulmonary:     Effort: Pulmonary effort is normal.  Abdominal:     General: Abdomen is flat. Bowel sounds are normal.  Musculoskeletal:        General: Normal range of motion.     Cervical back: Normal range of motion.  Skin:    General: Skin is warm.  Neurological:     General: No focal deficit present.     Mental Status: He is alert.  Psychiatric:        Mood and Affect: Mood normal.       EKG:   Recent Labs: 11/05/2022: Hemoglobin 13.3; Platelets 166 06/16/2023: ALT 16; BUN 17; Creatinine, Ser 1.06; Potassium 4.3; Sodium 138; TSH 0.820    Lipid Panel    Component Value Date/Time   CHOL 127 06/16/2023 0843   TRIG 81 06/16/2023 0843   HDL 65 06/16/2023 0843   CHOLHDL 2.0 06/16/2023 0843   CHOLHDL 2.0 02/24/2022 0855   LDLCALC 46 06/16/2023 0843   LDLCALC 54 02/24/2022 0855      Other studies Reviewed: Additional studies/ records that were reviewed today include:  Review of the above records demonstrates:       No data to display            ASSESSMENT AND PLAN:    ICD-10-CM   1. Coronary artery disease with hx of myocardial infarct w/o hx of CABG  I25.10    I25.2    ca score over 3000,, crestor 40 daily, but no chest pain.    2. Mixed hyperlipidemia  E78.2    LDL 48 but ca score very high with h/o CABG, crestor40  is more likely cause regression of plaque.    3. Essential hypertension  I10     4. Coronary artery disease due to lipid rich plaque  I25.10    I25.83     5. SOB  (shortness of breath)  R06.02        Problem List Items Addressed This Visit       Cardiovascular and Mediastinum   CAD (coronary artery disease)   Essential hypertension     Other   Hyperlipidemia   Other Visit Diagnoses       Coronary artery disease with hx of myocardial infarct w/o hx of CABG    -  Primary   ca score over 3000,, crestor 40 daily, but no chest pain.     SOB (shortness of breath)              Disposition:   No follow-ups on file.    Total time spent: 30 minutes  Signed,  [name], MD  07/07/2023 9:32 AM    [facility]

## 2023-08-11 ENCOUNTER — Other Ambulatory Visit: Payer: Self-pay

## 2023-08-11 DIAGNOSIS — I2510 Atherosclerotic heart disease of native coronary artery without angina pectoris: Secondary | ICD-10-CM

## 2023-08-11 DIAGNOSIS — R0602 Shortness of breath: Secondary | ICD-10-CM

## 2023-08-11 DIAGNOSIS — I252 Old myocardial infarction: Secondary | ICD-10-CM

## 2023-08-11 DIAGNOSIS — E782 Mixed hyperlipidemia: Secondary | ICD-10-CM

## 2023-08-11 DIAGNOSIS — I10 Essential (primary) hypertension: Secondary | ICD-10-CM

## 2023-08-11 MED ORDER — ROSUVASTATIN CALCIUM 40 MG PO TABS: 40.0000 mg | ORAL_TABLET | Freq: Every day | ORAL | 0 refills | Status: DC

## 2023-08-31 DIAGNOSIS — Z0100 Encounter for examination of eyes and vision without abnormal findings: Secondary | ICD-10-CM | POA: Diagnosis not present

## 2023-08-31 DIAGNOSIS — H43811 Vitreous degeneration, right eye: Secondary | ICD-10-CM | POA: Diagnosis not present

## 2023-08-31 DIAGNOSIS — Z961 Presence of intraocular lens: Secondary | ICD-10-CM | POA: Diagnosis not present

## 2023-09-01 ENCOUNTER — Other Ambulatory Visit: Payer: Self-pay | Admitting: Internal Medicine

## 2023-09-01 DIAGNOSIS — E782 Mixed hyperlipidemia: Secondary | ICD-10-CM

## 2023-09-15 ENCOUNTER — Ambulatory Visit: Payer: Medicare PPO | Admitting: Dermatology

## 2023-09-15 ENCOUNTER — Encounter: Payer: Self-pay | Admitting: Dermatology

## 2023-09-15 DIAGNOSIS — L820 Inflamed seborrheic keratosis: Secondary | ICD-10-CM | POA: Diagnosis not present

## 2023-09-15 DIAGNOSIS — W908XXA Exposure to other nonionizing radiation, initial encounter: Secondary | ICD-10-CM

## 2023-09-15 DIAGNOSIS — L578 Other skin changes due to chronic exposure to nonionizing radiation: Secondary | ICD-10-CM | POA: Diagnosis not present

## 2023-09-15 DIAGNOSIS — Z85828 Personal history of other malignant neoplasm of skin: Secondary | ICD-10-CM

## 2023-09-15 DIAGNOSIS — D692 Other nonthrombocytopenic purpura: Secondary | ICD-10-CM

## 2023-09-15 DIAGNOSIS — L821 Other seborrheic keratosis: Secondary | ICD-10-CM | POA: Diagnosis not present

## 2023-09-15 DIAGNOSIS — L570 Actinic keratosis: Secondary | ICD-10-CM

## 2023-09-15 NOTE — Progress Notes (Signed)
 Follow-Up Visit   Subjective  Bruce Gomez is a 78 y.o. male who presents for the following: 6 month AK follow up. Patient with hx of BCC.  The patient has spots, moles and lesions to be evaluated, some may be new or changing and the patient may have concern these could be cancer.  The following portions of the chart were reviewed this encounter and updated as appropriate: medications, allergies, medical history  Review of Systems:  No other skin or systemic complaints except as noted in HPI or Assessment and Plan.  Objective  Well appearing patient in no apparent distress; mood and affect are within normal limits.  A focused examination was performed of the following areas: face Relevant exam findings are noted in the Assessment and Plan.  arms, hands (3) Erythematous stuck-on, waxy papule or plaque face, ears (9) Erythematous thin papules/macules with gritty scale.   Assessment & Plan   ACTINIC DAMAGE - chronic, secondary to cumulative UV radiation exposure/sun exposure over time - diffuse scaly erythematous macules with underlying dyspigmentation - Recommend daily broad spectrum sunscreen SPF 30+ to sun-exposed areas, reapply every 2 hours as needed.  - Recommend staying in the shade or wearing long sleeves, sun glasses (UVA+UVB protection) and wide brim hats (4-inch brim around the entire circumference of the hat). - Call for new or changing lesions.  SEBORRHEIC KERATOSIS - Stuck-on, waxy, tan-brown papules and/or plaques  - Benign-appearing - Discussed benign etiology and prognosis. - Observe - Call for any changes  Purpura - Chronic; persistent and recurrent.  Treatable, but not curable. - Violaceous macules and patches - Benign - Related to trauma, age, sun damage and/or use of blood thinners, chronic use of topical and/or oral steroids - Observe - Can use OTC arnica containing moisturizer such as Dermend Bruise Formula if desired - Call for worsening or other  concerns  HISTORY OF BASAL CELL CARCINOMA OF THE SKIN - No evidence of recurrence today with 0.4 cm papule in the middle of scar at [facility] site - Recommend regular full body skin exams - Recommend daily broad spectrum sunscreen SPF 30+ to sun-exposed areas, reapply every 2 hours as needed.  - Call if any new or changing lesions are noted between office visits     INFLAMED SEBORRHEIC KERATOSIS (3) arms, hands (3) Symptomatic, irritating, patient would like treated.  Benign-appearing.  Call clinic for new or changing lesions.   Destruction of lesion - arms, hands (3) Complexity: simple   Destruction method: cryotherapy   Informed consent: discussed and consent obtained   Timeout:  patient name, date of birth, surgical site, and procedure verified Lesion destroyed using liquid nitrogen: Yes   Region frozen until ice ball extended beyond lesion: Yes   Outcome: patient tolerated procedure well with no complications   Post-procedure details: wound care instructions given   AK (ACTINIC KERATOSIS) (9) face, ears (9) Actinic keratoses are precancerous spots that appear secondary to cumulative UV radiation exposure/sun exposure over time. They are chronic with expected duration over 1 year. A portion of actinic keratoses will progress to squamous cell carcinoma of the skin. It is not possible to reliably predict which spots will progress to skin cancer and so treatment is recommended to prevent development of skin cancer.  Recommend daily broad spectrum sunscreen SPF 30+ to sun-exposed areas, reapply every 2 hours as needed.  Recommend staying in the shade or wearing long sleeves, sun glasses (UVA+UVB protection) and wide brim hats (4-inch brim around the entire circumference of  the hat). Call for new or changing lesions. Destruction of lesion - face, ears (9) Complexity: simple   Destruction method: cryotherapy   Informed consent: discussed and consent obtained   Timeout:  patient name, date  of birth, surgical site, and procedure verified Lesion destroyed using liquid nitrogen: Yes   Region frozen until ice ball extended beyond lesion: Yes   Outcome: patient tolerated procedure well with no complications   Post-procedure details: wound care instructions given    Return in about 7 months (around 04/16/2024) for TBSE, with Dr. Linnell Richardson, HxBCC, HxAK.  Kerstin Peeling, RMA, am acting as scribe for Celine Collard, MD .   Documentation: I have reviewed the above documentation for accuracy and completeness, and I agree with the above.  Celine Collard, MD

## 2023-09-15 NOTE — Patient Instructions (Signed)

## 2023-09-22 ENCOUNTER — Other Ambulatory Visit (INDEPENDENT_AMBULATORY_CARE_PROVIDER_SITE_OTHER)

## 2023-09-22 DIAGNOSIS — E782 Mixed hyperlipidemia: Secondary | ICD-10-CM

## 2023-09-23 LAB — HEPATIC FUNCTION PANEL
ALT: 15 IU/L (ref 0–44)
AST: 21 IU/L (ref 0–40)
Albumin: 4.2 g/dL (ref 3.8–4.8)
Alkaline Phosphatase: 85 IU/L (ref 44–121)
Bilirubin Total: 0.3 mg/dL (ref 0.0–1.2)
Bilirubin, Direct: 0.14 mg/dL (ref 0.00–0.40)
Total Protein: 6.3 g/dL (ref 6.0–8.5)

## 2023-09-23 LAB — LIPID PANEL
Chol/HDL Ratio: 1.9 ratio (ref 0.0–5.0)
Cholesterol, Total: 131 mg/dL (ref 100–199)
HDL: 69 mg/dL (ref >39)
LDL Chol Calc (NIH): 49 mg/dL (ref 0–99)
Triglycerides: 60 mg/dL (ref 0–149)
VLDL Cholesterol Cal: 13 mg/dL (ref 5–40)

## 2023-09-23 LAB — CK: Total CK: 174 U/L (ref 41–331)

## 2023-09-26 ENCOUNTER — Encounter: Payer: Self-pay | Admitting: Internal Medicine

## 2023-09-26 ENCOUNTER — Ambulatory Visit (INDEPENDENT_AMBULATORY_CARE_PROVIDER_SITE_OTHER): Admitting: Internal Medicine

## 2023-09-26 ENCOUNTER — Ambulatory Visit: Payer: Self-pay | Admitting: Internal Medicine

## 2023-09-26 ENCOUNTER — Other Ambulatory Visit: Payer: Self-pay | Admitting: Internal Medicine

## 2023-09-26 VITALS — BP 112/60 | HR 76 | Temp 96.8°F | Ht 63.0 in | Wt 161.8 lb

## 2023-09-26 DIAGNOSIS — R109 Unspecified abdominal pain: Secondary | ICD-10-CM | POA: Diagnosis not present

## 2023-09-26 DIAGNOSIS — F419 Anxiety disorder, unspecified: Secondary | ICD-10-CM | POA: Diagnosis not present

## 2023-09-26 DIAGNOSIS — I10 Essential (primary) hypertension: Secondary | ICD-10-CM

## 2023-09-26 DIAGNOSIS — E782 Mixed hyperlipidemia: Secondary | ICD-10-CM | POA: Diagnosis not present

## 2023-09-26 DIAGNOSIS — G8929 Other chronic pain: Secondary | ICD-10-CM

## 2023-09-26 DIAGNOSIS — K2100 Gastro-esophageal reflux disease with esophagitis, without bleeding: Secondary | ICD-10-CM

## 2023-09-26 MED ORDER — CLONAZEPAM 0.5 MG PO TABS
0.5000 mg | ORAL_TABLET | Freq: Every day | ORAL | 2 refills | Status: DC
Start: 2023-09-26 — End: 2024-01-09

## 2023-09-26 MED ORDER — LISINOPRIL 10 MG PO TABS: 10.0000 mg | ORAL_TABLET | Freq: Every day | ORAL | 1 refills | Status: DC

## 2023-09-26 MED ORDER — TIZANIDINE HCL 2 MG PO CAPS
2.0000 mg | ORAL_CAPSULE | Freq: Every evening | ORAL | 0 refills | Status: DC | PRN
Start: 2023-09-26 — End: 2023-10-26

## 2023-09-26 MED ORDER — ESOMEPRAZOLE MAGNESIUM 40 MG PO CPDR: 40.0000 mg | DELAYED_RELEASE_CAPSULE | Freq: Every day | ORAL | 1 refills | Status: DC

## 2023-09-26 NOTE — Progress Notes (Signed)
 Established Patient Office Visit  Subjective:  Patient ID: Bruce Gomez, male    DOB: [birth year]  Age: 78 y.o. MRN: [id number]  Chief Complaint  Patient presents with   Follow-up    3 month follow up    No new complaints, here for lab review and medication refills. Reports long standing right side pain which is worse at night especially when he lies on that side. LDL and TC well controlled on lab review. Triglycerides also satisfactory while hepatic panel and ck satisfactory.        No other concerns at this time.   Past Medical History:  Diagnosis Date   Actinic keratosis    Anemia    Aortic sclerosis 04/21/2017   Overview:  Moderate Aortic Sclerosis, mild LVH,  mild Mitral valve thickening, Slight LAE on ECHO - started  on ACE  for  LVH   Arthritis    hips and lower back   Basal cell carcinoma 04/26/2022   Right nose - EDC   BPH with obstruction/lower urinary tract symptoms 03/19/2014   BPH with obstruction/lower urinary tract symptoms 03/19/2014   CAD (coronary artery disease) 04/21/2017   Overview:   silent MI - Inferior; [place]/p CABG x 3; Stress Echo  6/05; 8/08;  2/12   Chronic prostatitis 11/03/2000   Coronary artery disease    Elevated PSA 03/19/2014   Erectile dysfunction 03/19/2014   History of basal cell carcinoma 07/02/2015   right nose   History of dysplastic nevus 06/22/2017   left ant deltoid   Hyperlipidemia 04/21/2017   Hypertension    Impaired glucose tolerance 04/21/2017   Myocardial infarction (HCC) 2002   OSA (obstructive sleep apnea) 04/21/2017   Overview:  rx with wt loss, side sleeping-no CPAP, not true sleep apnea   RLS (restless legs syndrome) 04/21/2017    Past Surgical History:  Procedure Laterality Date   ADENOIDECTOMY     CARDIAC SURGERY     cabg 2002   CATARACT EXTRACTION W/PHACO Right 07/26/2022   Procedure: CATARACT EXTRACTION PHACO AND INTRAOCULAR LENS PLACEMENT (IOC) RIGHT;  Surgeon: Myrna Adine Anes, MD;  Location: [facility];  Service: Ophthalmology;  Laterality: Right;  9.76 0:59.2   CATARACT EXTRACTION W/PHACO Left 08/09/2022   Procedure: CATARACT EXTRACTION PHACO AND INTRAOCULAR LENS PLACEMENT (IOC) LEFT;  Surgeon: Myrna Adine Anes, MD;  Location: [facility];  Service: Ophthalmology;  Laterality: Left;  2.59  00:25.8   COLONOSCOPY WITH PROPOFOL  N/A 12/14/2019   Procedure: COLONOSCOPY WITH PROPOFOL ;  Surgeon: Jinny Carmine, MD;  Location: [facility];  Service: Endoscopy;  Laterality: N/A;  priority 4   CORONARY ARTERY BYPASS GRAFT  2002   x3   INGUINAL HERNIA REPAIR     POLYPECTOMY  12/14/2019   Procedure: POLYPECTOMY;  Surgeon: Jinny Carmine, MD;  Location: [facility];  Service: Endoscopy;;   right thumb pin Right 04/2021   TRANSURETHRAL RESECTION OF PROSTATE     2014    Social History   Socioeconomic History   Marital status: Married    Spouse name: Not on file   Number of children: Not on file   Years of education: Not on file   Highest education level: Not on file  Occupational History   Not on file  Tobacco Use   Smoking status: Never   Smokeless tobacco: Never  Vaping Use   Vaping status: Never Used  Substance and Sexual Activity   Alcohol use: Yes    Alcohol/week: 1.0 - 2.0  standard drink of alcohol    Types: 1 - 2 Glasses of wine per week    Comment: ocassional glass of wine   Drug use: No   Sexual activity: Yes    Birth control/protection: None  Other Topics Concern   Not on file  Social History Narrative   Not on file   Social Drivers of Health   Financial Resource Strain: Low Risk  (03/15/2022)   Overall Financial Resource Strain (CARDIA)    Difficulty of Paying Living Expenses: Not hard at all  Food Insecurity: No Food Insecurity (03/15/2022)   Hunger Vital Sign    Worried About Running Out of Food in the Last Year: Never true    Ran Out of Food in the Last Year: Never true  Transportation Needs: No Transportation Needs  (03/15/2022)   PRAPARE - Administrator, Civil Service (Medical): No    Lack of Transportation (Non-Medical): No  Physical Activity: Sufficiently Active (03/15/2022)   Exercise Vital Sign    Days of Exercise per Week: 4 days    Minutes of Exercise per Session: 40 min  Stress: No Stress Concern Present (03/15/2022)   [employer] of Occupational Health - Occupational Stress Questionnaire    Feeling of Stress : Not at all  Social Connections: Moderately Integrated (03/15/2022)   Social Connection and Isolation Panel    Frequency of Communication with Friends and Family: More than three times a week    Frequency of Social Gatherings with Friends and Family: More than three times a week    Attends Religious Services: More than 4 times per year    Active Member of [employer] or Organizations: Not on file    Attends Banker Meetings: Never    Marital Status: Married  Catering manager Violence: Not At Risk (03/15/2022)   Humiliation, Afraid, Rape, and Kick questionnaire    Fear of Current or Ex-Partner: No    Emotionally Abused: No    Physically Abused: No    Sexually Abused: No    Family History  Problem Relation Age of Onset   Prostate cancer Paternal Uncle    Bladder Cancer Neg Hx    Kidney cancer Neg Hx     Allergies  Allergen Reactions   Pollen Extract     seasonal   Sulfa Antibiotics Other (See Comments)    Dizziness    Sulfamethoxazole-Trimethoprim     dizzy    Benadryl [Diphenhydramine Hcl (Sleep)] Anxiety    Outpatient Medications Prior to Visit  Medication Sig   acetaminophen (TYLENOL) 500 MG tablet Take 500 mg by mouth every 6 (six) hours as needed.   aspirin EC 81 MG tablet Take 81 mg by mouth.   B Complex Vitamins (B COMPLEX 1 PO) Take by mouth. Takes 2 a week   calcium  carbonate (OSCAL) 1500 (600 Ca) MG TABS tablet Take by mouth.   calcium  carbonate (TUMS - DOSED IN MG ELEMENTAL CALCIUM ) 500 MG chewable tablet Chew 1 tablet by mouth  as needed for indigestion or heartburn.   Cholecalciferol (VITAMIN D3) 2000 units capsule Take 2,000 Units by mouth every other day.   Coenzyme Q10 100 MG capsule Take 200 mg by mouth. Takes 2 times week   ferrous sulfate 324 MG TBEC Take 324 mg by mouth daily.   finasteride  (PROSCAR ) 5 MG tablet TAKE 1 TABLET EVERY DAY   folic acid  (FOLVITE ) 400 MCG tablet Take 800 mcg by mouth. 2 times a week   loratadine (CLARITIN)  10 MG tablet Take 10 mg by mouth daily as needed for allergies.   Melatonin 3 MG TABS Take by mouth.   metoprolol  succinate (TOPROL -XL) 25 MG 24 hr tablet Take 0.5 tablets (12.5 mg total) by mouth 2 (two) times daily.   niacin  (VITAMIN B3) 500 MG ER tablet TAKE 4 TABLETS AT BEDTIME   Omega-3 1000 MG CAPS Take 1,200 mg by mouth daily.   rosuvastatin  (CRESTOR ) 40 MG tablet Take 1 tablet (40 mg total) by mouth daily.   sildenafil  (VIAGRA ) 100 MG tablet TAKE ONE TABLET BY MOUTH ONE HOUR PRIOR TO INTERCOURSE AS NEEDED   tadalafil  (CIALIS ) 10 MG tablet Take 2 tablets 1 hour prior to intercourse   vitamin C (ASCORBIC ACID) 500 MG tablet Take 500 mg by mouth.   Zinc 50 MG TABS Take 1 tablet by mouth every 14 (fourteen) days.   [DISCONTINUED] clonazePAM  (KLONOPIN ) 0.5 MG tablet Take 1 tablet (0.5 mg total) by mouth at bedtime.   [DISCONTINUED] esomeprazole  (NEXIUM ) 40 MG capsule Take 1 capsule (40 mg total) by mouth daily.   [DISCONTINUED] lisinopril  (ZESTRIL ) 10 MG tablet Take 1 tablet (10 mg total) by mouth daily.   No facility-administered medications prior to visit.    Review of Systems  Constitutional:  Positive for weight loss.  HENT: Negative.    Eyes: Negative.   Respiratory: Negative.    Gastrointestinal: Negative.   Genitourinary: Negative.   Musculoskeletal: Negative.   Skin: Negative.   Neurological: Negative.   Endo/Heme/Allergies: Negative.   Psychiatric/Behavioral: Negative.    All other systems reviewed and are negative.      Objective:   BP 112/60    Pulse 76   Temp (!) 96.8 F (36 C)   Ht 5' 3 (1.6 m)   Wt 161 lb 12.8 oz (73.4 kg)   SpO2 97%   BMI 28.66 kg/m   Vitals:   09/26/23 1136  BP: 112/60  Pulse: 76  Temp: (!) 96.8 F (36 C)  Height: 5' 3 (1.6 m)  Weight: 161 lb 12.8 oz (73.4 kg)  SpO2: 97%  BMI (Calculated): 28.67    Physical Exam Vitals reviewed.  Constitutional:      Appearance: Normal appearance. He is normal weight.  HENT:     Head: Normocephalic.     Nose: Nose normal.     Mouth/Throat:     Mouth: Mucous membranes are moist.   Eyes:     Pupils: Pupils are equal, round, and reactive to light.    Cardiovascular:     Rate and Rhythm: Normal rate and regular rhythm.     Pulses: Normal pulses.     Heart sounds: Normal heart sounds.  Pulmonary:     Effort: Pulmonary effort is normal.  Abdominal:     General: Abdomen is flat. Bowel sounds are normal.   Musculoskeletal:        General: Normal range of motion.     Cervical back: Normal range of motion.   Skin:    General: Skin is warm.   Neurological:     General: No focal deficit present.     Mental Status: He is alert.   Psychiatric:        Mood and Affect: Mood normal.      No results found for any visits on 09/26/23.  Recent Results (from the past 2160 hours)  Lipid panel     Status: None   Collection Time: 09/22/23  9:15 AM  Result Value Ref Range  Cholesterol, Total 131 100 - 199 mg/dL   Triglycerides 60 0 - 149 mg/dL   HDL 69 >60 mg/dL   VLDL Cholesterol Cal 13 5 - 40 mg/dL   LDL Chol Calc (NIH) 49 0 - 99 mg/dL   Chol/HDL Ratio 1.9 0.0 - 5.0 ratio    Comment:                                   T. Chol/HDL Ratio                                             Men  Women                               1/2 Avg.Risk  3.4    3.3                                   Avg.Risk  5.0    4.4                                2X Avg.Risk  9.6    7.1                                3X Avg.Risk 23.4   11.0   Hepatic function panel     Status:  None   Collection Time: 09/22/23  9:15 AM  Result Value Ref Range   Total Protein 6.3 6.0 - 8.5 g/dL   Albumin 4.2 3.8 - 4.8 g/dL   Bilirubin Total 0.3 0.0 - 1.2 mg/dL   Bilirubin, Direct 9.85 0.00 - 0.40 mg/dL   Alkaline Phosphatase 85 44 - 121 IU/L   AST 21 0 - 40 IU/L   ALT 15 0 - 44 IU/L  CK     Status: None   Collection Time: 09/22/23  9:16 AM  Result Value Ref Range   Total CK 174 41 - 331 U/L      Assessment & Plan:  As per problem list.  Problem List Items Addressed This Visit       Cardiovascular and Mediastinum   Essential hypertension   Relevant Medications   lisinopril  (ZESTRIL ) 10 MG tablet     Other   Hyperlipidemia - Primary   Relevant Medications   lisinopril  (ZESTRIL ) 10 MG tablet   Anxiety   Relevant Medications   clonazePAM  (KLONOPIN ) 0.5 MG tablet   Right flank pain, chronic   Relevant Medications   tizanidine (ZANAFLEX) 2 MG capsule   clonazePAM  (KLONOPIN ) 0.5 MG tablet   Other Visit Diagnoses       Gastroesophageal reflux disease with esophagitis without hemorrhage       Relevant Medications   esomeprazole  (NEXIUM ) 40 MG capsule       Return in about 3 months (around 12/27/2023) for fu with labs prior.   Total time spent: 20 minutes  Sherrill Cinderella Perry, MD  09/26/2023   This document may have been prepared by [facility] Voice Recognition software and as such may include unintentional dictation errors.

## 2023-10-27 ENCOUNTER — Ambulatory Visit (INDEPENDENT_AMBULATORY_CARE_PROVIDER_SITE_OTHER): Admitting: Cardiovascular Disease

## 2023-10-27 ENCOUNTER — Encounter: Payer: Self-pay | Admitting: Cardiovascular Disease

## 2023-10-27 VITALS — BP 112/60 | HR 69 | Ht 63.0 in | Wt 160.0 lb

## 2023-10-27 DIAGNOSIS — I252 Old myocardial infarction: Secondary | ICD-10-CM

## 2023-10-27 DIAGNOSIS — E782 Mixed hyperlipidemia: Secondary | ICD-10-CM

## 2023-10-27 DIAGNOSIS — I10 Essential (primary) hypertension: Secondary | ICD-10-CM

## 2023-10-27 DIAGNOSIS — I2510 Atherosclerotic heart disease of native coronary artery without angina pectoris: Secondary | ICD-10-CM

## 2023-10-27 DIAGNOSIS — I2583 Coronary atherosclerosis due to lipid rich plaque: Secondary | ICD-10-CM | POA: Diagnosis not present

## 2023-10-27 NOTE — Progress Notes (Signed)
 Cardiology Office Note   Date:  10/27/2023   ID:  [name], DOB 31-Dec-1945, MRN [id number]  PCP:  Albina GORMAN Dine, MD  Cardiologist:  Denyse Bathe, MD      History of Present Illness: Bruce Gomez is a 78 y.o. male who presents for  Chief Complaint  Patient presents with   Follow-up    3 months follow up    Doing good.      Past Medical History:  Diagnosis Date   Actinic keratosis    Anemia    Aortic sclerosis 04/21/2017   Overview:  Moderate Aortic Sclerosis, mild LVH,  mild Mitral valve thickening, Slight LAE on ECHO - started  on ACE  for  LVH   Arthritis    hips and lower back   Basal cell carcinoma 04/26/2022   Right nose - EDC   BPH with obstruction/lower urinary tract symptoms 03/19/2014   BPH with obstruction/lower urinary tract symptoms 03/19/2014   CAD (coronary artery disease) 04/21/2017   Overview:   silent MI - Inferior; s/p CABG x 3; Stress Echo  6/05; 8/08;  2/12   Chronic prostatitis 11/03/2000   Coronary artery disease    Elevated PSA 03/19/2014   Erectile dysfunction 03/19/2014   History of basal cell carcinoma 07/02/2015   right nose   History of dysplastic nevus 06/22/2017   left ant deltoid   Hyperlipidemia 04/21/2017   Hypertension    Impaired glucose tolerance 04/21/2017   Myocardial infarction (HCC) 2002   OSA (obstructive sleep apnea) 04/21/2017   Overview:  rx with wt loss, side sleeping-no CPAP, not true sleep apnea   RLS (restless legs syndrome) 04/21/2017     Past Surgical History:  Procedure Laterality Date   ADENOIDECTOMY     CARDIAC SURGERY     cabg 2002   CATARACT EXTRACTION W/PHACO Right 07/26/2022   Procedure: CATARACT EXTRACTION PHACO AND INTRAOCULAR LENS PLACEMENT (IOC) RIGHT;  Surgeon: Myrna Adine Anes, MD;  Location: [facility];  Service: Ophthalmology;  Laterality: Right;  9.76 0:59.2   CATARACT EXTRACTION W/PHACO Left 08/09/2022   Procedure: CATARACT EXTRACTION PHACO AND INTRAOCULAR LENS  PLACEMENT (IOC) LEFT;  Surgeon: Myrna Adine Anes, MD;  Location: [facility];  Service: Ophthalmology;  Laterality: Left;  2.59  00:25.8   COLONOSCOPY WITH PROPOFOL  N/A 12/14/2019   Procedure: COLONOSCOPY WITH PROPOFOL ;  Surgeon: Jinny Carmine, MD;  Location: [facility];  Service: Endoscopy;  Laterality: N/A;  priority 4   CORONARY ARTERY BYPASS GRAFT  2002   x3   INGUINAL HERNIA REPAIR     POLYPECTOMY  12/14/2019   Procedure: POLYPECTOMY;  Surgeon: Jinny Carmine, MD;  Location: [facility];  Service: Endoscopy;;   right thumb pin Right 04/2021   TRANSURETHRAL RESECTION OF PROSTATE     2014     Current Outpatient Medications  Medication Sig Dispense Refill   acetaminophen (TYLENOL) 500 MG tablet Take 500 mg by mouth every 6 (six) hours as needed.     aspirin EC 81 MG tablet Take 81 mg by mouth.     B Complex Vitamins (B COMPLEX 1 PO) Take by mouth. Takes 2 a week     calcium  carbonate (OSCAL) 1500 (600 Ca) MG TABS tablet Take by mouth.     calcium  carbonate (TUMS - DOSED IN MG ELEMENTAL CALCIUM ) 500 MG chewable tablet Chew 1 tablet by mouth as needed for indigestion or heartburn.     Cholecalciferol (VITAMIN D3) 2000 units capsule Take  2,000 Units by mouth every other day.     clonazePAM  (KLONOPIN ) 0.5 MG tablet Take 1 tablet (0.5 mg total) by mouth at bedtime. 30 tablet 2   Coenzyme Q10 100 MG capsule Take 200 mg by mouth. Takes 2 times week     esomeprazole  (NEXIUM ) 40 MG capsule Take 1 capsule (40 mg total) by mouth daily. 90 capsule 1   ferrous sulfate 324 MG TBEC Take 324 mg by mouth daily.     finasteride  (PROSCAR ) 5 MG tablet TAKE 1 TABLET EVERY DAY 90 tablet 3   folic acid  (FOLVITE ) 400 MCG tablet Take 800 mcg by mouth. 2 times a week     lisinopril  (ZESTRIL ) 10 MG tablet Take 1 tablet (10 mg total) by mouth daily. 90 tablet 1   loratadine (CLARITIN) 10 MG tablet Take 10 mg by mouth daily as needed for allergies.     Melatonin 3 MG TABS Take by mouth.      metoprolol  succinate (TOPROL -XL) 25 MG 24 hr tablet Take 0.5 tablets (12.5 mg total) by mouth 2 (two) times daily. 90 tablet 1   niacin  (VITAMIN B3) 500 MG ER tablet TAKE 4 TABLETS AT BEDTIME 360 tablet 3   Omega-3 1000 MG CAPS Take 1,200 mg by mouth daily.     rosuvastatin  (CRESTOR ) 40 MG tablet Take 1 tablet (40 mg total) by mouth daily. 90 tablet 0   sildenafil  (VIAGRA ) 100 MG tablet TAKE ONE TABLET BY MOUTH ONE HOUR PRIOR TO INTERCOURSE AS NEEDED 90 tablet 1   tadalafil  (CIALIS ) 10 MG tablet Take 2 tablets 1 hour prior to intercourse 10 tablet 0   tiZANidine  (ZANAFLEX ) 2 MG tablet TAKE 1 TABLET BY MOUTH AT BEDTIME AS NEEDED FOR MUSCLE SPASM 30 tablet 0   vitamin C (ASCORBIC ACID) 500 MG tablet Take 500 mg by mouth.     Zinc 50 MG TABS Take 1 tablet by mouth every 14 (fourteen) days.     No current facility-administered medications for this visit.    Allergies:   Pollen extract, Sulfa antibiotics, Sulfamethoxazole-trimethoprim, and Benadryl [diphenhydramine hcl (sleep)]    Social History:   reports that he has never smoked. He has never used smokeless tobacco. He reports current alcohol use of about 1.0 - 2.0 standard drink of alcohol per week. He reports that he does not use drugs.   Family History:  family history includes Prostate cancer in his paternal uncle.    ROS:     Review of Systems  Constitutional: Negative.   HENT: Negative.    Eyes: Negative.   Respiratory: Negative.    Gastrointestinal: Negative.   Genitourinary: Negative.   Musculoskeletal: Negative.   Skin: Negative.   Neurological: Negative.   Endo/Heme/Allergies: Negative.   Psychiatric/Behavioral: Negative.    All other systems reviewed and are negative.     All other systems are reviewed and negative.    PHYSICAL EXAM: VS:  BP 112/60   Pulse 69   Ht 5' 3 (1.6 m)   Wt 160 lb (72.6 kg)   SpO2 96%   BMI 28.34 kg/m  , BMI Body mass index is 28.34 kg/m. Last weight:  Wt Readings from Last 3  Encounters:  10/27/23 160 lb (72.6 kg)  09/26/23 161 lb 12.8 oz (73.4 kg)  07/07/23 163 lb 12.8 oz (74.3 kg)     Physical Exam Vitals reviewed.  Constitutional:      Appearance: Normal appearance. He is normal weight.  HENT:     Head:  Normocephalic.     Nose: Nose normal.     Mouth/Throat:     Mouth: Mucous membranes are moist.  Eyes:     Pupils: Pupils are equal, round, and reactive to light.  Cardiovascular:     Rate and Rhythm: Normal rate and regular rhythm.     Pulses: Normal pulses.     Heart sounds: Normal heart sounds.  Pulmonary:     Effort: Pulmonary effort is normal.  Abdominal:     General: Abdomen is flat. Bowel sounds are normal.  Musculoskeletal:        General: Normal range of motion.     Cervical back: Normal range of motion.  Skin:    General: Skin is warm.  Neurological:     General: No focal deficit present.     Mental Status: He is alert.  Psychiatric:        Mood and Affect: Mood normal.       EKG:   Recent Labs: 11/05/2022: Hemoglobin 13.3; Platelets 166 06/16/2023: BUN 17; Creatinine, Ser 1.06; Potassium 4.3; Sodium 138; TSH 0.820 09/22/2023: ALT 15    Lipid Panel    Component Value Date/Time   CHOL 131 09/22/2023 0915   TRIG 60 09/22/2023 0915   HDL 69 09/22/2023 0915   CHOLHDL 1.9 09/22/2023 0915   CHOLHDL 2.0 02/24/2022 0855   LDLCALC 49 09/22/2023 0915   LDLCALC 54 02/24/2022 0855      Other studies Reviewed: Additional studies/ records that were reviewed today include:  Review of the above records demonstrates:       No data to display            ASSESSMENT AND PLAN:    ICD-10-CM   1. Coronary artery disease due to lipid rich plaque  I25.10    I25.83    Np chest pain.    2. Essential hypertension  I10     3. Mixed hyperlipidemia  E78.2    LDL 49    4. Hx of acute myocardial infarction  I25.2        Problem List Items Addressed This Visit       Cardiovascular and Mediastinum   CAD (coronary artery  disease) - Primary   Essential hypertension     Other   Hyperlipidemia   Hx of acute myocardial infarction       Disposition:   Return in about 3 months (around 01/27/2024).    Total time spent: 30 minutes  Signed,  Denyse Bathe, MD  10/27/2023 10:00 AM    [facility]

## 2023-11-01 ENCOUNTER — Other Ambulatory Visit: Payer: Self-pay | Admitting: Internal Medicine

## 2023-11-15 ENCOUNTER — Other Ambulatory Visit: Payer: Self-pay | Admitting: Cardiology

## 2023-11-15 DIAGNOSIS — I252 Old myocardial infarction: Secondary | ICD-10-CM

## 2023-11-15 DIAGNOSIS — E782 Mixed hyperlipidemia: Secondary | ICD-10-CM

## 2023-11-15 DIAGNOSIS — I10 Essential (primary) hypertension: Secondary | ICD-10-CM

## 2023-11-15 DIAGNOSIS — R0602 Shortness of breath: Secondary | ICD-10-CM

## 2023-11-15 DIAGNOSIS — I2510 Atherosclerotic heart disease of native coronary artery without angina pectoris: Secondary | ICD-10-CM

## 2023-11-29 ENCOUNTER — Encounter: Payer: Self-pay | Admitting: Dermatology

## 2023-11-29 ENCOUNTER — Ambulatory Visit (INDEPENDENT_AMBULATORY_CARE_PROVIDER_SITE_OTHER): Admitting: Dermatology

## 2023-11-29 DIAGNOSIS — C44311 Basal cell carcinoma of skin of nose: Secondary | ICD-10-CM | POA: Diagnosis not present

## 2023-11-29 DIAGNOSIS — L578 Other skin changes due to chronic exposure to nonionizing radiation: Secondary | ICD-10-CM

## 2023-11-29 DIAGNOSIS — W908XXA Exposure to other nonionizing radiation, initial encounter: Secondary | ICD-10-CM | POA: Diagnosis not present

## 2023-11-29 DIAGNOSIS — L820 Inflamed seborrheic keratosis: Secondary | ICD-10-CM

## 2023-11-29 DIAGNOSIS — C44319 Basal cell carcinoma of skin of other parts of face: Secondary | ICD-10-CM | POA: Diagnosis not present

## 2023-11-29 DIAGNOSIS — D492 Neoplasm of unspecified behavior of bone, soft tissue, and skin: Secondary | ICD-10-CM

## 2023-11-29 DIAGNOSIS — L570 Actinic keratosis: Secondary | ICD-10-CM | POA: Diagnosis not present

## 2023-11-29 DIAGNOSIS — Z85828 Personal history of other malignant neoplasm of skin: Secondary | ICD-10-CM | POA: Diagnosis not present

## 2023-11-29 NOTE — Progress Notes (Unsigned)
 Follow-Up Visit   Subjective  Bruce Gomez is a 78 y.o. male who presents for the following: check spot nose ~71m, getting larger The patient has spots, moles and lesions to be evaluated, some may be new or changing and the patient may have concern these could be cancer.  Patient accompanied by wife.  The following portions of the chart were reviewed this encounter and updated as appropriate: medications, allergies, medical history  Review of Systems:  No other skin or systemic complaints except as noted in HPI or Assessment and Plan.  Objective  Well appearing patient in no apparent distress; mood and affect are within normal limits.  A focused examination was performed of the following areas: Face, neck  Relevant exam findings are noted in the Assessment and Plan.  R nose in center of BCC treatment scar 0.4 x 0.3cm firm pap  L neck x 1, L mandible x 1 (2) Pink scaly macules R forehead x 1 Stuck on waxy paps with erythema  Assessment & Plan   NEOPLASM OF SKIN R nose in center of BCC treatment scar Epidermal / dermal shaving  Lesion diameter (cm):  0.4 Informed consent: discussed and consent obtained   Timeout: patient name, date of birth, surgical site, and procedure verified   Procedure prep:  Patient was prepped and draped in usual sterile fashion Prep type:  Isopropyl alcohol Anesthesia: the lesion was anesthetized in a standard fashion   Anesthetic:  1% lidocaine  w/ epinephrine  1-100,000 buffered w/ 8.4% NaHCO3 Instrument used: flexible razor blade   Hemostasis achieved with: pressure, aluminum chloride and electrodesiccation   Outcome: patient tolerated procedure well   Post-procedure details: sterile dressing applied and wound care instructions given   Dressing type: bandage and bacitracin    Destruction of lesion Complexity: extensive   Destruction method: electrodesiccation and curettage   Informed consent: discussed and consent obtained   Timeout:   patient name, date of birth, surgical site, and procedure verified Procedure prep:  Patient was prepped and draped in usual sterile fashion Prep type:  Isopropyl alcohol Anesthesia: the lesion was anesthetized in a standard fashion   Anesthetic:  1% lidocaine  w/ epinephrine  1-100,000 buffered w/ 8.4% NaHCO3 Curettage performed in three different directions: Yes   Electrodesiccation performed over the curetted area: Yes   Lesion length (cm):  0.4 Lesion width (cm):  0.4 Margin per side (cm):  0.2 Final wound size (cm):  0.8 Hemostasis achieved with:  pressure, aluminum chloride and electrodesiccation Outcome: patient tolerated procedure well with no complications   Post-procedure details: sterile dressing applied and wound care instructions given   Dressing type: bandage, petrolatum and bacitracin    Specimen 1 - Surgical pathology Differential Diagnosis: Scar vs recurrent BCC  Check Margins: yes 0.4 x 0.3cm firm pap 2-3 pieces, EDC today AK (ACTINIC KERATOSIS) (2) L neck x 1, L mandible x 1 (2) Actinic keratoses are precancerous spots that appear secondary to cumulative UV radiation exposure/sun exposure over time. They are chronic with expected duration over 1 year. A portion of actinic keratoses will progress to squamous cell carcinoma of the skin. It is not possible to reliably predict which spots will progress to skin cancer and so treatment is recommended to prevent development of skin cancer.  Recommend daily broad spectrum sunscreen SPF 30+ to sun-exposed areas, reapply every 2 hours as needed.  Recommend staying in the shade or wearing long sleeves, sun glasses (UVA+UVB protection) and wide brim hats (4-inch brim around the entire circumference of  the hat). Call for new or changing lesions. Destruction of lesion - L neck x 1, L mandible x 1 (2) Complexity: simple   Destruction method: cryotherapy   Informed consent: discussed and consent obtained   Timeout:  patient name,  date of birth, surgical site, and procedure verified Lesion destroyed using liquid nitrogen: Yes   Region frozen until ice ball extended beyond lesion: Yes   Outcome: patient tolerated procedure well with no complications   Post-procedure details: wound care instructions given    INFLAMED SEBORRHEIC KERATOSIS R forehead x 1 Symptomatic, irritating, patient would like treated. Destruction of lesion - R forehead x 1 Complexity: simple   Destruction method: cryotherapy   Informed consent: discussed and consent obtained   Timeout:  patient name, date of birth, surgical site, and procedure verified Lesion destroyed using liquid nitrogen: Yes   Region frozen until ice ball extended beyond lesion: Yes   Outcome: patient tolerated procedure well with no complications   Post-procedure details: wound care instructions given    ACTINIC SKIN DAMAGE   HISTORY OF BASAL CELL CARCINOMA    ACTINIC DAMAGE - chronic, secondary to cumulative UV radiation exposure/sun exposure over time - diffuse scaly erythematous macules with underlying dyspigmentation - Recommend daily broad spectrum sunscreen SPF 30+ to sun-exposed areas, reapply every 2 hours as needed.  - Recommend staying in the shade or wearing long sleeves, sun glasses (UVA+UVB protection) and wide brim hats (4-inch brim around the entire circumference of the hat). - Call for new or changing lesions.   HISTORY OF BASAL CELL CARCINOMA OF THE SKIN - No evidence of recurrence today - Recommend regular full body skin exams - Recommend daily broad spectrum sunscreen SPF 30+ to sun-exposed areas, reapply every 2 hours as needed.  - Call if any new or changing lesions are noted between office visits   Return for as scheduled for TBSE , Hx of BCC, Hx of AKs, Hx of Dysplastic nevi.  I, [name], RMA, am acting as scribe for [name], MD .   Documentation: I have reviewed the above documentation for accuracy and completeness, and I  agree with the above.  [name], MD

## 2023-11-29 NOTE — Patient Instructions (Addendum)

## 2023-11-30 ENCOUNTER — Ambulatory Visit: Payer: Self-pay | Admitting: Dermatology

## 2023-11-30 ENCOUNTER — Encounter: Payer: Self-pay | Admitting: Dermatology

## 2023-11-30 DIAGNOSIS — C4491 Basal cell carcinoma of skin, unspecified: Secondary | ICD-10-CM

## 2023-11-30 HISTORY — DX: Basal cell carcinoma of skin, unspecified: C44.91

## 2023-11-30 LAB — SURGICAL PATHOLOGY

## 2023-11-30 NOTE — Telephone Encounter (Addendum)
 Tried calling patient regarding results. No answer. LM for patient to return call.   ----- Message from Bruce Gomez sent at 11/30/2023  5:33 PM EDT ----- FINAL DIAGNOSIS        1. Skin, R nose in center of BCC treatment scar :       BASAL CELL CARCINOMA WITH FIBROSIS, DEEP MARGIN INVOLVED, SEE DESCRIPTION   Cancer = BCC Recurrent Already treated Recheck next visit ----- Message ----- From: Interface, Lab In [facility] Sent: 11/30/2023   5:26 PM EDT To: Bruce JAYSON Rhyme, MD

## 2023-12-01 NOTE — Telephone Encounter (Addendum)
 Called and discussed bx results with patient. He verbalized understanding. Will recheck at next follow up  ----- Message from [name] sent at 11/30/2023  5:33 PM EDT ----- FINAL DIAGNOSIS        1. Skin, R nose in center of BCC treatment scar :       BASAL CELL CARCINOMA WITH FIBROSIS, DEEP MARGIN INVOLVED, SEE DESCRIPTION   Cancer = BCC Recurrent Already treated Recheck next visit ----- Message ----- From: Interface, Lab In [facility] Sent: 11/30/2023   5:26 PM EDT To: Alm JAYSON Rhyme, MD

## 2023-12-13 ENCOUNTER — Ambulatory Visit (INDEPENDENT_AMBULATORY_CARE_PROVIDER_SITE_OTHER): Admitting: Cardiovascular Disease

## 2023-12-13 ENCOUNTER — Ambulatory Visit: Payer: Self-pay | Admitting: Cardiovascular Disease

## 2023-12-13 ENCOUNTER — Encounter: Payer: Self-pay | Admitting: Cardiovascular Disease

## 2023-12-13 VITALS — BP 136/78 | HR 78 | Ht 63.0 in | Wt 164.8 lb

## 2023-12-13 DIAGNOSIS — R0789 Other chest pain: Secondary | ICD-10-CM | POA: Insufficient documentation

## 2023-12-13 DIAGNOSIS — I2583 Coronary atherosclerosis due to lipid rich plaque: Secondary | ICD-10-CM

## 2023-12-13 DIAGNOSIS — G4733 Obstructive sleep apnea (adult) (pediatric): Secondary | ICD-10-CM

## 2023-12-13 DIAGNOSIS — I10 Essential (primary) hypertension: Secondary | ICD-10-CM

## 2023-12-13 DIAGNOSIS — R0602 Shortness of breath: Secondary | ICD-10-CM

## 2023-12-13 DIAGNOSIS — I200 Unstable angina: Secondary | ICD-10-CM

## 2023-12-13 DIAGNOSIS — I25702 Atherosclerosis of coronary artery bypass graft(s), unspecified, with refractory angina pectoris: Secondary | ICD-10-CM

## 2023-12-13 DIAGNOSIS — I252 Old myocardial infarction: Secondary | ICD-10-CM | POA: Diagnosis not present

## 2023-12-13 DIAGNOSIS — I2510 Atherosclerotic heart disease of native coronary artery without angina pectoris: Secondary | ICD-10-CM | POA: Diagnosis not present

## 2023-12-13 DIAGNOSIS — R9439 Abnormal result of other cardiovascular function study: Secondary | ICD-10-CM | POA: Insufficient documentation

## 2023-12-13 DIAGNOSIS — I25110 Atherosclerotic heart disease of native coronary artery with unstable angina pectoris: Secondary | ICD-10-CM | POA: Diagnosis not present

## 2023-12-13 DIAGNOSIS — E782 Mixed hyperlipidemia: Secondary | ICD-10-CM

## 2023-12-13 LAB — CBC WITH DIFFERENTIAL/PLATELET
Basophils Absolute: 0 x10E3/uL (ref 0.0–0.2)
Basos: 0 %
EOS (ABSOLUTE): 0.4 x10E3/uL (ref 0.0–0.4)
Eos: 3 %
Hematocrit: 34.1 % — ABNORMAL LOW (ref 37.5–51.0)
Hemoglobin: 10.8 g/dL — ABNORMAL LOW (ref 13.0–17.7)
Immature Grans (Abs): 0 x10E3/uL (ref 0.0–0.1)
Immature Granulocytes: 0 %
Lymphocytes Absolute: 1.6 x10E3/uL (ref 0.7–3.1)
Lymphs: 15 %
MCH: 30.6 pg (ref 26.6–33.0)
MCHC: 31.7 g/dL (ref 31.5–35.7)
MCV: 97 fL (ref 79–97)
Monocytes Absolute: 1.2 x10E3/uL — ABNORMAL HIGH (ref 0.1–0.9)
Monocytes: 11 %
Neutrophils Absolute: 7.6 x10E3/uL — ABNORMAL HIGH (ref 1.4–7.0)
Neutrophils: 71 %
Platelets: 220 x10E3/uL (ref 150–450)
RBC: 3.53 x10E6/uL — ABNORMAL LOW (ref 4.14–5.80)
RDW: 12.2 % (ref 11.6–15.4)
WBC: 10.9 x10E3/uL — ABNORMAL HIGH (ref 3.4–10.8)

## 2023-12-13 NOTE — Progress Notes (Signed)
 Cardiology Office Note   Date:  12/13/2023   ID:  Bruce Gomez, DOB [birth year], MRN [id number]  PCP:  Albina GORMAN Dine, MD  Cardiologist:  Denyse Bathe, MD      History of Present Illness: Bruce Gomez is a 78 y.o. male who presents for  Chief Complaint  Patient presents with   Follow-up    Wants to recheck having SOB and joint issues no chest pain.    Has questions., Had severe SOB, and was mostly with exertion and had severe ischaemia in 12/25. Ca score 3900.      Past Medical History:  Diagnosis Date   Actinic keratosis    Anemia    Aortic sclerosis 04/21/2017   Overview:  Moderate Aortic Sclerosis, mild LVH,  mild Mitral valve thickening, Slight LAE on ECHO - started  on ACE  for  LVH   Arthritis    hips and lower back   Basal cell carcinoma 04/26/2022   Right nose - EDC   BCC (basal cell carcinoma of skin) 11/30/2023   right nose in center of [facility] treatment scar - recurrent bcc - ED&C done  recheck at next visit   BPH with obstruction/lower urinary tract symptoms 03/19/2014   BPH with obstruction/lower urinary tract symptoms 03/19/2014   CAD (coronary artery disease) 04/21/2017   Overview:   silent MI - Inferior; s/p CABG x 3; Stress Echo  6/05; 8/08;  2/12   Chronic prostatitis 11/03/2000   Coronary artery disease    Elevated PSA 03/19/2014   Erectile dysfunction 03/19/2014   History of basal cell carcinoma 07/02/2015   right nose   History of dysplastic nevus 06/22/2017   left ant deltoid   Hyperlipidemia 04/21/2017   Hypertension    Impaired glucose tolerance 04/21/2017   Myocardial infarction (HCC) 2002   OSA (obstructive sleep apnea) 04/21/2017   Overview:  rx with wt loss, side sleeping-no CPAP, not true sleep apnea   RLS (restless legs syndrome) 04/21/2017     Past Surgical History:  Procedure Laterality Date   ADENOIDECTOMY     CARDIAC SURGERY     cabg 2002   CATARACT EXTRACTION W/PHACO Right 07/26/2022   Procedure: CATARACT  EXTRACTION PHACO AND INTRAOCULAR LENS PLACEMENT (IOC) RIGHT;  Surgeon: Myrna Adine Anes, MD;  Location: [facility];  Service: Ophthalmology;  Laterality: Right;  9.76 0:59.2   CATARACT EXTRACTION W/PHACO Left 08/09/2022   Procedure: CATARACT EXTRACTION PHACO AND INTRAOCULAR LENS PLACEMENT (IOC) LEFT;  Surgeon: Myrna Adine Anes, MD;  Location: [facility];  Service: Ophthalmology;  Laterality: Left;  2.59  00:25.8   COLONOSCOPY WITH PROPOFOL  N/A 12/14/2019   Procedure: COLONOSCOPY WITH PROPOFOL ;  Surgeon: Jinny Carmine, MD;  Location: [facility];  Service: Endoscopy;  Laterality: N/A;  priority 4   CORONARY ARTERY BYPASS GRAFT  2002   x3   INGUINAL HERNIA REPAIR     POLYPECTOMY  12/14/2019   Procedure: POLYPECTOMY;  Surgeon: Jinny Carmine, MD;  Location: [facility];  Service: Endoscopy;;   right thumb pin Right 04/2021   TRANSURETHRAL RESECTION OF PROSTATE     2014     Current Outpatient Medications  Medication Sig Dispense Refill   acetaminophen  (TYLENOL ) 500 MG tablet Take 500 mg by mouth every 6 (six) hours as needed.     aspirin  EC 81 MG tablet Take 81 mg by mouth.     B Complex Vitamins (B COMPLEX 1 PO) Take by mouth. Takes 2 a week  calcium  carbonate (OSCAL) 1500 (600 Ca) MG TABS tablet Take by mouth.     calcium  carbonate (TUMS - DOSED IN MG ELEMENTAL CALCIUM ) 500 MG chewable tablet Chew 1 tablet by mouth as needed for indigestion or heartburn.     Cholecalciferol (VITAMIN D3) 2000 units capsule Take 2,000 Units by mouth every other day.     clonazePAM  (KLONOPIN ) 0.5 MG tablet Take 1 tablet (0.5 mg total) by mouth at bedtime. 30 tablet 2   Coenzyme Q10 100 MG capsule Take 200 mg by mouth. Takes 2 times week     esomeprazole  (NEXIUM ) 40 MG capsule Take 1 capsule (40 mg total) by mouth daily. 90 capsule 1   finasteride  (PROSCAR ) 5 MG tablet TAKE 1 TABLET EVERY DAY 90 tablet 3   folic acid  (FOLVITE ) 400 MCG tablet Take 800 mcg by mouth. 2 times  a week     lisinopril  (ZESTRIL ) 10 MG tablet Take 1 tablet (10 mg total) by mouth daily. 90 tablet 1   loratadine (CLARITIN) 10 MG tablet Take 10 mg by mouth daily as needed for allergies.     Melatonin 3 MG TABS Take by mouth. (Patient taking differently: Take 3 mg by mouth as needed.)     metoprolol  succinate (TOPROL -XL) 25 MG 24 hr tablet TAKE 1/2 TABLET TWICE DAILY 90 tablet 3   niacin  (VITAMIN B3) 500 MG ER tablet TAKE 4 TABLETS AT BEDTIME 360 tablet 3   Omega-3 1000 MG CAPS Take 1,200 mg by mouth daily.     rosuvastatin  (CRESTOR ) 40 MG tablet TAKE 1 TABLET EVERY DAY 90 tablet 1   sildenafil  (VIAGRA ) 100 MG tablet TAKE ONE TABLET BY MOUTH ONE HOUR PRIOR TO INTERCOURSE AS NEEDED 90 tablet 1   vitamin C (ASCORBIC ACID) 500 MG tablet Take 500 mg by mouth.     Zinc 50 MG TABS Take 1 tablet by mouth every 14 (fourteen) days.     No current facility-administered medications for this visit.    Allergies:   Pollen extract, Sulfa antibiotics, Sulfamethoxazole-trimethoprim, and Benadryl [diphenhydramine hcl (sleep)]    Social History:   reports that he has never smoked. He has never used smokeless tobacco. He reports current alcohol use of about 1.0 - 2.0 standard drink of alcohol per week. He reports that he does not use drugs.   Family History:  family history includes Prostate cancer in his paternal uncle.    ROS:     Review of Systems  Constitutional: Negative.   HENT: Negative.    Eyes: Negative.   Respiratory: Negative.    Gastrointestinal: Negative.   Genitourinary: Negative.   Musculoskeletal: Negative.   Skin: Negative.   Neurological: Negative.   Endo/Heme/Allergies: Negative.   Psychiatric/Behavioral: Negative.    All other systems reviewed and are negative.     All other systems are reviewed and negative.    PHYSICAL EXAM: VS:  BP 136/78   Pulse 78   Ht 5' 3 (1.6 m)   Wt 164 lb 12.8 oz (74.8 kg)   SpO2 99%   BMI 29.19 kg/m  , BMI Body mass index is 29.19  kg/m. Last weight:  Wt Readings from Last 3 Encounters:  12/13/23 164 lb 12.8 oz (74.8 kg)  10/27/23 160 lb (72.6 kg)  09/26/23 161 lb 12.8 oz (73.4 kg)     Physical Exam Vitals reviewed.  Constitutional:      Appearance: Normal appearance. He is normal weight.  HENT:     Head: Normocephalic.  Nose: Nose normal.     Mouth/Throat:     Mouth: Mucous membranes are moist.  Eyes:     Pupils: Pupils are equal, round, and reactive to light.  Cardiovascular:     Rate and Rhythm: Normal rate and regular rhythm.     Pulses: Normal pulses.     Heart sounds: Normal heart sounds.  Pulmonary:     Effort: Pulmonary effort is normal.  Abdominal:     General: Abdomen is flat. Bowel sounds are normal.  Musculoskeletal:        General: Normal range of motion.     Cervical back: Normal range of motion.  Skin:    General: Skin is warm.  Neurological:     General: No focal deficit present.     Mental Status: He is alert.  Psychiatric:        Mood and Affect: Mood normal.       EKG:   Recent Labs: 06/16/2023: BUN 17; Creatinine, Ser 1.06; Potassium 4.3; Sodium 138; TSH 0.820 09/22/2023: ALT 15    Lipid Panel    Component Value Date/Time   CHOL 131 09/22/2023 0915   TRIG 60 09/22/2023 0915   HDL 69 09/22/2023 0915   CHOLHDL 1.9 09/22/2023 0915   CHOLHDL 2.0 02/24/2022 0855   LDLCALC 49 09/22/2023 0915   LDLCALC 54 02/24/2022 0855      Other studies Reviewed: Additional studies/ records that were reviewed today include:  Review of the above records demonstrates:       No data to display            ASSESSMENT AND PLAN:    ICD-10-CM   1. SOB (shortness of breath)  R06.02 Comprehensive metabolic panel    CBC with Differential/Platelet   Has DOE, or anginal equivalent. Had severe RCA/LCX ischaemia on stress test.    2. Coronary artery disease due to lipid rich plaque  I25.10 Comprehensive metabolic panel   P74.16 CBC with Differential/Platelet    3. Essential  hypertension  I10 Comprehensive metabolic panel    CBC with Differential/Platelet    4. OSA (obstructive sleep apnea)  G47.33 Comprehensive metabolic panel    CBC with Differential/Platelet    5. Mixed hyperlipidemia  E78.2 Comprehensive metabolic panel    CBC with Differential/Platelet    6. Hx of acute myocardial infarction  I25.2 Comprehensive metabolic panel    CBC with Differential/Platelet    7. Coronary artery disease with hx of myocardial infarct w/o hx of CABG  I25.10 Comprehensive metabolic panel   P74.7 CBC with Differential/Platelet    8. Unstable angina (HCC)  I20.0        Problem List Items Addressed This Visit       Cardiovascular and Mediastinum   CAD (coronary artery disease)   Relevant Orders   Comprehensive metabolic panel   CBC with Differential/Platelet   Essential hypertension   Relevant Orders   Comprehensive metabolic panel   CBC with Differential/Platelet     Respiratory   OSA (obstructive sleep apnea)   Relevant Orders   Comprehensive metabolic panel   CBC with Differential/Platelet     Other   Hyperlipidemia   Relevant Orders   Comprehensive metabolic panel   CBC with Differential/Platelet   Hx of acute myocardial infarction   Relevant Orders   Comprehensive metabolic panel   CBC with Differential/Platelet   SOB (shortness of breath) - Primary   Relevant Orders   Comprehensive metabolic panel   CBC with Differential/Platelet  Other Visit Diagnoses       Coronary artery disease with hx of myocardial infarct w/o hx of CABG       Relevant Orders   Comprehensive metabolic panel   CBC with Differential/Platelet     Unstable angina (HCC)              Disposition:   Return in about 3 weeks (around 01/03/2024) for set up cardiac cth this week, but not tomorrow, and f/u.    Total time spent: 50 minutes  Signed,  Denyse Bathe, MD  12/13/2023 10:41 AM    [facility]

## 2023-12-14 LAB — COMPREHENSIVE METABOLIC PANEL WITH GFR
ALT: 15 IU/L (ref 0–44)
AST: 21 IU/L (ref 0–40)
Albumin: 4.4 g/dL (ref 3.8–4.8)
Alkaline Phosphatase: 82 IU/L (ref 44–121)
BUN/Creatinine Ratio: 11 (ref 10–24)
BUN: 15 mg/dL (ref 8–27)
Bilirubin Total: 0.2 mg/dL (ref 0.0–1.2)
CO2: 21 mmol/L (ref 20–29)
Calcium: 9.7 mg/dL (ref 8.6–10.2)
Chloride: 102 mmol/L (ref 96–106)
Creatinine, Ser: 1.32 mg/dL — ABNORMAL HIGH (ref 0.76–1.27)
Globulin, Total: 2.4 g/dL (ref 1.5–4.5)
Glucose: 99 mg/dL (ref 70–99)
Potassium: 4.7 mmol/L (ref 3.5–5.2)
Sodium: 138 mmol/L (ref 134–144)
Total Protein: 6.8 g/dL (ref 6.0–8.5)
eGFR: 55 mL/min/1.73 — ABNORMAL LOW (ref >59)

## 2023-12-15 ENCOUNTER — Other Ambulatory Visit: Payer: Self-pay

## 2023-12-15 ENCOUNTER — Encounter
Admission: RE | Disposition: A | Discharge status: Home / Self Care | Payer: Self-pay | Source: Home / Self Care | Attending: Cardiovascular Disease

## 2023-12-15 ENCOUNTER — Encounter: Payer: Self-pay | Admitting: Cardiovascular Disease

## 2023-12-15 ENCOUNTER — Ambulatory Visit
Admission: RE | Admit: 2023-12-15 | Discharge: 2023-12-15 | Disposition: A | Discharge status: Home / Self Care | Attending: Cardiovascular Disease | Admitting: Cardiovascular Disease

## 2023-12-15 DIAGNOSIS — I252 Old myocardial infarction: Secondary | ICD-10-CM | POA: Diagnosis not present

## 2023-12-15 DIAGNOSIS — I25702 Atherosclerosis of coronary artery bypass graft(s), unspecified, with refractory angina pectoris: Secondary | ICD-10-CM | POA: Insufficient documentation

## 2023-12-15 DIAGNOSIS — I10 Essential (primary) hypertension: Secondary | ICD-10-CM | POA: Diagnosis not present

## 2023-12-15 DIAGNOSIS — E782 Mixed hyperlipidemia: Secondary | ICD-10-CM | POA: Diagnosis not present

## 2023-12-15 DIAGNOSIS — G4733 Obstructive sleep apnea (adult) (pediatric): Secondary | ICD-10-CM | POA: Insufficient documentation

## 2023-12-15 DIAGNOSIS — I2582 Chronic total occlusion of coronary artery: Secondary | ICD-10-CM | POA: Insufficient documentation

## 2023-12-15 DIAGNOSIS — R0602 Shortness of breath: Secondary | ICD-10-CM | POA: Diagnosis not present

## 2023-12-15 DIAGNOSIS — I200 Unstable angina: Secondary | ICD-10-CM | POA: Diagnosis not present

## 2023-12-15 DIAGNOSIS — R9439 Abnormal result of other cardiovascular function study: Secondary | ICD-10-CM | POA: Diagnosis not present

## 2023-12-15 DIAGNOSIS — R0789 Other chest pain: Secondary | ICD-10-CM | POA: Insufficient documentation

## 2023-12-15 DIAGNOSIS — I2510 Atherosclerotic heart disease of native coronary artery without angina pectoris: Secondary | ICD-10-CM | POA: Diagnosis not present

## 2023-12-15 HISTORY — PX: LEFT HEART CATH AND CORONARY ANGIOGRAPHY: CATH118249

## 2023-12-15 LAB — BASIC METABOLIC PANEL WITH GFR
Anion gap: 9 (ref 5–15)
BUN: 18 mg/dL (ref 8–23)
CO2: 25 mmol/L (ref 22–32)
Calcium: 9.4 mg/dL (ref 8.9–10.3)
Chloride: 105 mmol/L (ref 98–111)
Creatinine, Ser: 1.07 mg/dL (ref 0.61–1.24)
GFR, Estimated: >60 mL/min (ref >60)
Glucose, Bld: 119 mg/dL — ABNORMAL HIGH (ref 70–99)
Potassium: 4 mmol/L (ref 3.5–5.1)
Sodium: 139 mmol/L (ref 135–145)

## 2023-12-15 SURGERY — LEFT HEART CATH AND CORONARY ANGIOGRAPHY
Anesthesia: Moderate Sedation | Laterality: Left

## 2023-12-15 MED ORDER — SODIUM CHLORIDE 0.9 % WEIGHT BASED INFUSION: 1.0000 mL/kg/h | INTRAVENOUS | Status: DC

## 2023-12-15 MED ORDER — SODIUM CHLORIDE 0.9 % IV SOLN: 250.0000 mL | INTRAVENOUS | Status: DC | PRN

## 2023-12-15 MED ORDER — LIDOCAINE HCL 1 % IJ SOLN
INTRAMUSCULAR | Status: AC
  Filled 2023-12-15: qty 20

## 2023-12-15 MED ORDER — FENTANYL CITRATE (PF) 100 MCG/2ML IJ SOLN
INTRAMUSCULAR | Status: AC
  Filled 2023-12-15: qty 2

## 2023-12-15 MED ORDER — HEPARIN (PORCINE) IN NACL 2000-0.9 UNIT/L-% IV SOLN
INTRAVENOUS | Status: DC | PRN
  Administered 2023-12-15: 1000 mL

## 2023-12-15 MED ORDER — ASPIRIN 81 MG PO CHEW
CHEWABLE_TABLET | ORAL | Status: AC
  Administered 2023-12-15: 81 mg via ORAL
  Filled 2023-12-15: qty 1

## 2023-12-15 MED ORDER — SODIUM CHLORIDE 0.9 % IV SOLN
250.0000 mL | INTRAVENOUS | Status: DC | PRN
  Administered 2023-12-15: 500 mL via INTRAVENOUS

## 2023-12-15 MED ORDER — HYDRALAZINE HCL 20 MG/ML IJ SOLN: 10.0000 mg | INTRAMUSCULAR | Status: DC | PRN

## 2023-12-15 MED ORDER — ACETAMINOPHEN 325 MG PO TABS: 650.0000 mg | ORAL_TABLET | ORAL | Status: DC | PRN

## 2023-12-15 MED ORDER — SODIUM CHLORIDE 0.9% FLUSH: 3.0000 mL | Freq: Two times a day (BID) | INTRAVENOUS | Status: DC

## 2023-12-15 MED ORDER — FENTANYL CITRATE (PF) 100 MCG/2ML IJ SOLN
INTRAMUSCULAR | Status: DC | PRN
  Administered 2023-12-15: 50 ug via INTRAVENOUS

## 2023-12-15 MED ORDER — LABETALOL HCL 5 MG/ML IV SOLN: 10.0000 mg | INTRAVENOUS | Status: DC | PRN

## 2023-12-15 MED ORDER — IOHEXOL 300 MG/ML  SOLN
INTRAMUSCULAR | Status: DC | PRN
  Administered 2023-12-15: 140 mL

## 2023-12-15 MED ORDER — LIDOCAINE HCL (PF) 1 % IJ SOLN
INTRAMUSCULAR | Status: DC | PRN
  Administered 2023-12-15: 10 mL

## 2023-12-15 MED ORDER — ONDANSETRON HCL 4 MG/2ML IJ SOLN: 4.0000 mg | Freq: Four times a day (QID) | INTRAMUSCULAR | Status: DC | PRN

## 2023-12-15 MED ORDER — MIDAZOLAM HCL 2 MG/2ML IJ SOLN
INTRAMUSCULAR | Status: AC
  Filled 2023-12-15: qty 2

## 2023-12-15 MED ORDER — MIDAZOLAM HCL 2 MG/2ML IJ SOLN
INTRAMUSCULAR | Status: DC | PRN
  Administered 2023-12-15: 1 mg via INTRAVENOUS

## 2023-12-15 MED ORDER — ASPIRIN 81 MG PO CHEW: 81.0000 mg | CHEWABLE_TABLET | ORAL | Status: AC

## 2023-12-15 MED ORDER — SODIUM CHLORIDE 0.9% FLUSH: 3.0000 mL | INTRAVENOUS | Status: DC | PRN

## 2023-12-15 MED ORDER — HEPARIN (PORCINE) IN NACL 1000-0.9 UT/500ML-% IV SOLN
INTRAVENOUS | Status: AC
  Filled 2023-12-15: qty 1000

## 2023-12-15 SURGICAL SUPPLY — 15 items
CATH ANGIO 5F JB2 100CM (CATHETERS) IMPLANT
CATH INFINITI 5 FR IM (CATHETERS) IMPLANT
CATH INFINITI 5 FR JL3.5 (CATHETERS) IMPLANT
CATH INFINITI 5FR AL1 (CATHETERS) IMPLANT
CATH INFINITI 5FR JL5 (CATHETERS) IMPLANT
CATH INFINITI 5FR MULTPACK ANG (CATHETERS) IMPLANT
DEVICE CLOSURE MYNXGRIP 5F (Vascular Products) IMPLANT
NDL PERC 18GX7CM (NEEDLE) IMPLANT
NEEDLE PERC 18GX7CM (NEEDLE) ×1 IMPLANT
PACK CARDIAC CATH (CUSTOM PROCEDURE TRAY) ×1 IMPLANT
SET ATX-X65L (MISCELLANEOUS) IMPLANT
SHEATH AVANTI 5FR X 11CM (SHEATH) IMPLANT
STATION PROTECTION PRESSURIZED (MISCELLANEOUS) IMPLANT
WIRE EMERALD 3MM-J .035X260CM (WIRE) IMPLANT
WIRE GUIDERIGHT .035X150 (WIRE) IMPLANT

## 2023-12-20 ENCOUNTER — Encounter: Payer: Self-pay | Admitting: Cardiovascular Disease

## 2023-12-20 ENCOUNTER — Ambulatory Visit (INDEPENDENT_AMBULATORY_CARE_PROVIDER_SITE_OTHER): Admitting: Cardiovascular Disease

## 2023-12-20 ENCOUNTER — Ambulatory Visit: Admitting: Cardiovascular Disease

## 2023-12-20 VITALS — BP 134/66 | HR 85 | Ht 63.0 in | Wt 167.8 lb

## 2023-12-20 DIAGNOSIS — R0602 Shortness of breath: Secondary | ICD-10-CM | POA: Diagnosis not present

## 2023-12-20 DIAGNOSIS — I25702 Atherosclerosis of coronary artery bypass graft(s), unspecified, with refractory angina pectoris: Secondary | ICD-10-CM | POA: Diagnosis not present

## 2023-12-20 DIAGNOSIS — I2583 Coronary atherosclerosis due to lipid rich plaque: Secondary | ICD-10-CM | POA: Diagnosis not present

## 2023-12-20 DIAGNOSIS — I10 Essential (primary) hypertension: Secondary | ICD-10-CM

## 2023-12-20 DIAGNOSIS — I252 Old myocardial infarction: Secondary | ICD-10-CM

## 2023-12-20 DIAGNOSIS — E782 Mixed hyperlipidemia: Secondary | ICD-10-CM

## 2023-12-20 DIAGNOSIS — I2510 Atherosclerotic heart disease of native coronary artery without angina pectoris: Secondary | ICD-10-CM

## 2023-12-20 NOTE — Progress Notes (Signed)
 Cardiology Office Note   Date:  12/20/2023   ID:  [name], DOB 1945/09/06, MRN [id number]  PCP:  Albina GORMAN Dine, MD  Cardiologist:  Denyse Bathe, MD      History of Present Illness: Bruce Gomez is a 78 y.o. male who presents for  Chief Complaint  Patient presents with   Follow-up    Cath follow up    Feeling fine, and had 2 SVG occluded to OM and PDA. LIMA to LAD patent. SVG to om was in LCX without significant native disease. SVG to RCA occluded but has collaterals from LCA.       Past Medical History:  Diagnosis Date   Actinic keratosis    Anemia    Aortic sclerosis 04/21/2017   Overview:  Moderate Aortic Sclerosis, mild LVH,  mild Mitral valve thickening, Slight LAE on ECHO - started  on ACE  for  LVH   Arthritis    hips and lower back   Basal cell carcinoma 04/26/2022   Right nose - EDC   BCC (basal cell carcinoma of skin) 11/30/2023   right nose in center of [facility] treatment scar - recurrent bcc - ED&C done  recheck at next visit   BPH with obstruction/lower urinary tract symptoms 03/19/2014   BPH with obstruction/lower urinary tract symptoms 03/19/2014   CAD (coronary artery disease) 04/21/2017   Overview:   silent MI - Inferior; s/p CABG x 3; Stress Echo  6/05; 8/08;  2/12   Chronic prostatitis 11/03/2000   Coronary artery disease    Elevated PSA 03/19/2014   Erectile dysfunction 03/19/2014   History of basal cell carcinoma 07/02/2015   right nose   History of dysplastic nevus 06/22/2017   left ant deltoid   Hyperlipidemia 04/21/2017   Hypertension    Impaired glucose tolerance 04/21/2017   Myocardial infarction (HCC) 2002   OSA (obstructive sleep apnea) 04/21/2017   Overview:  rx with wt loss, side sleeping-no CPAP, not true sleep apnea   RLS (restless legs syndrome) 04/21/2017     Past Surgical History:  Procedure Laterality Date   ADENOIDECTOMY     CARDIAC SURGERY     cabg 2002   CATARACT EXTRACTION W/PHACO Right 07/26/2022    Procedure: CATARACT EXTRACTION PHACO AND INTRAOCULAR LENS PLACEMENT (IOC) RIGHT;  Surgeon: Myrna Adine Anes, MD;  Location: [facility];  Service: Ophthalmology;  Laterality: Right;  9.76 0:59.2   CATARACT EXTRACTION W/PHACO Left 08/09/2022   Procedure: CATARACT EXTRACTION PHACO AND INTRAOCULAR LENS PLACEMENT (IOC) LEFT;  Surgeon: Myrna Adine Anes, MD;  Location: [facility];  Service: Ophthalmology;  Laterality: Left;  2.59  00:25.8   COLONOSCOPY WITH PROPOFOL  N/A 12/14/2019   Procedure: COLONOSCOPY WITH PROPOFOL ;  Surgeon: Jinny Carmine, MD;  Location: [facility];  Service: Endoscopy;  Laterality: N/A;  priority 4   CORONARY ARTERY BYPASS GRAFT  2002   x3   INGUINAL HERNIA REPAIR     LEFT HEART CATH AND CORONARY ANGIOGRAPHY Left 12/15/2023   Procedure: LEFT HEART CATH AND CORONARY ANGIOGRAPHY with possible coronary intervention;  Surgeon: Bathe Denyse LABOR, MD;  Location: [facility] IN[facility];  Service: Cardiovascular;  Laterality: Left;   POLYPECTOMY  12/14/2019   Procedure: POLYPECTOMY;  Surgeon: Jinny Carmine, MD;  Location: [facility];  Service: Endoscopy;;   right thumb pin Right 04/2021   TRANSURETHRAL RESECTION OF PROSTATE     2014     Current Outpatient Medications  Medication Sig Dispense Refill  acetaminophen  (TYLENOL ) 500 MG tablet Take 500 mg by mouth every 6 (six) hours as needed for moderate pain (pain score 4-6).     Ascorbic Acid (VITAMIN C) 1000 MG tablet Take 1,000 mg by mouth daily.     aspirin  EC 81 MG tablet Take 81 mg by mouth in the morning and at bedtime.     B Complex Vitamins (B COMPLEX 1 PO) Take 1 tablet by mouth 2 (two) times a week.     calcium  carbonate (OSCAL) 1500 (600 Ca) MG TABS tablet Take 600 mg of elemental calcium  by mouth daily.     calcium  carbonate (TUMS - DOSED IN MG ELEMENTAL CALCIUM ) 500 MG chewable tablet Chew 1 tablet by mouth as needed for indigestion or heartburn.     carboxymethylcellulose (REFRESH PLUS)  0.5 % SOLN Place 1 drop into both eyes daily as needed (dry eyes).     Cholecalciferol (VITAMIN D3) 125 MCG (5000 UT) TABS Take 5,000 Units by mouth every other day.     clonazePAM  (KLONOPIN ) 0.5 MG tablet Take 1 tablet (0.5 mg total) by mouth at bedtime. 30 tablet 2   Coenzyme Q10 100 MG capsule Take 200 mg by mouth 2 (two) times a week.     diclofenac Sodium (VOLTAREN) 1 % GEL Apply 2 g topically daily as needed (pain).     esomeprazole  (NEXIUM ) 40 MG capsule Take 1 capsule (40 mg total) by mouth daily. 90 capsule 1   finasteride  (PROSCAR ) 5 MG tablet TAKE 1 TABLET EVERY DAY 90 tablet 3   folic acid  (FOLVITE ) 800 MCG tablet Take 800 mcg by mouth 2 (two) times a week.     lisinopril  (ZESTRIL ) 10 MG tablet Take 1 tablet (10 mg total) by mouth daily. 90 tablet 1   loratadine (CLARITIN) 10 MG tablet Take 10 mg by mouth daily as needed for allergies.     Melatonin 3 MG TABS Take by mouth. (Patient taking differently: Take 3 mg by mouth at bedtime as needed (sleep).)     metoprolol  succinate (TOPROL -XL) 25 MG 24 hr tablet TAKE 1/2 TABLET TWICE DAILY 90 tablet 3   niacin  (VITAMIN B3) 500 MG ER tablet TAKE 4 TABLETS AT BEDTIME 360 tablet 3   Omega-3 Fatty Acids (OMEGA-3 PO) Take 1,000 mg by mouth daily.     rosuvastatin  (CRESTOR ) 40 MG tablet TAKE 1 TABLET EVERY DAY 90 tablet 1   sildenafil  (VIAGRA ) 100 MG tablet TAKE ONE TABLET BY MOUTH ONE HOUR PRIOR TO INTERCOURSE AS NEEDED 90 tablet 1   sodium chloride  (OCEAN) 0.65 % SOLN nasal spray Place 1 spray into both nostrils at bedtime as needed for congestion.     Zinc 50 MG TABS Take 1 tablet by mouth 2 (two) times a week.     No current facility-administered medications for this visit.    Allergies:   Pollen extract, Sulfa antibiotics, Sulfamethoxazole-trimethoprim, and Benadryl [diphenhydramine hcl (sleep)]    Social History:   reports that he has never smoked. He has never used smokeless tobacco. He reports current alcohol use of about 1.0 - 2.0  standard drink of alcohol per week. He reports that he does not use drugs.   Family History:  family history includes Prostate cancer in his paternal uncle.    ROS:     Review of Systems  Constitutional: Negative.   HENT: Negative.    Eyes: Negative.   Respiratory: Negative.    Gastrointestinal: Negative.   Genitourinary: Negative.   Musculoskeletal: Negative.  Skin: Negative.   Neurological: Negative.   Endo/Heme/Allergies: Negative.   Psychiatric/Behavioral: Negative.    All other systems reviewed and are negative.     All other systems are reviewed and negative.    PHYSICAL EXAM: VS:  BP 134/66   Pulse 85   Ht 5' 3 (1.6 m)   Wt 167 lb 12.8 oz (76.1 kg)   SpO2 95%   BMI 29.72 kg/m  , BMI Body mass index is 29.72 kg/m. Last weight:  Wt Readings from Last 3 Encounters:  12/20/23 167 lb 12.8 oz (76.1 kg)  12/13/23 164 lb 12.8 oz (74.8 kg)  10/27/23 160 lb (72.6 kg)     Physical Exam Vitals reviewed.  Constitutional:      Appearance: Normal appearance. He is normal weight.  HENT:     Head: Normocephalic.     Nose: Nose normal.     Mouth/Throat:     Mouth: Mucous membranes are moist.  Eyes:     Pupils: Pupils are equal, round, and reactive to light.  Cardiovascular:     Rate and Rhythm: Normal rate and regular rhythm.     Pulses: Normal pulses.     Heart sounds: Normal heart sounds.  Pulmonary:     Effort: Pulmonary effort is normal.  Abdominal:     General: Abdomen is flat. Bowel sounds are normal.  Musculoskeletal:        General: Normal range of motion.     Cervical back: Normal range of motion.  Skin:    General: Skin is warm.  Neurological:     General: No focal deficit present.     Mental Status: He is alert.  Psychiatric:        Mood and Affect: Mood normal.       EKG:   Recent Labs: 06/16/2023: TSH 0.820 12/13/2023: ALT 15; Hemoglobin 10.8; Platelets 220 12/15/2023: BUN 18; Creatinine, Ser 1.07; Potassium 4.0; Sodium 139    Lipid  Panel    Component Value Date/Time   CHOL 131 09/22/2023 0915   TRIG 60 09/22/2023 0915   HDL 69 09/22/2023 0915   CHOLHDL 1.9 09/22/2023 0915   CHOLHDL 2.0 02/24/2022 0855   LDLCALC 49 09/22/2023 0915   LDLCALC 54 02/24/2022 0855      Other studies Reviewed: Additional studies/ records that were reviewed today include:  Review of the above records demonstrates:       No data to display            ASSESSMENT AND PLAN:    ICD-10-CM   1. SOB (shortness of breath)  R06.02    doing fine, treat medically. Also exercise on GDMT.    2. Coronary artery disease involving coronary bypass graft of native heart with refractory angina pectoris (HCC)  I25.702     3. Essential hypertension  I10     4. Coronary artery disease due to lipid rich plaque  I25.10    I25.83     5. Mixed hyperlipidemia  E78.2     6. Coronary artery disease with hx of myocardial infarct w/o hx of CABG  I25.10    I25.2        Problem List Items Addressed This Visit       Cardiovascular and Mediastinum   CAD (coronary artery disease)   Essential hypertension     Other   Hyperlipidemia   SOB (shortness of breath) - Primary   Other Visit Diagnoses       Coronary artery disease  with hx of myocardial infarct w/o hx of CABG              Disposition:   No follow-ups on file.    Total time spent: 30 minutes  Signed,  Denyse Bathe, MD  12/20/2023 1:32 PM    [facility]

## 2024-01-05 ENCOUNTER — Other Ambulatory Visit

## 2024-01-05 DIAGNOSIS — E782 Mixed hyperlipidemia: Secondary | ICD-10-CM | POA: Diagnosis not present

## 2024-01-05 DIAGNOSIS — I10 Essential (primary) hypertension: Secondary | ICD-10-CM

## 2024-01-05 DIAGNOSIS — R7303 Prediabetes: Secondary | ICD-10-CM

## 2024-01-06 ENCOUNTER — Ambulatory Visit: Admitting: Internal Medicine

## 2024-01-06 LAB — CMP14+EGFR
ALT: 13 IU/L (ref 0–44)
AST: 20 IU/L (ref 0–40)
Albumin: 4.3 g/dL (ref 3.8–4.8)
Alkaline Phosphatase: 77 IU/L (ref 47–123)
BUN/Creatinine Ratio: 16 (ref 10–24)
BUN: 17 mg/dL (ref 8–27)
Bilirubin Total: 0.4 mg/dL (ref 0.0–1.2)
CO2: 24 mmol/L (ref 20–29)
Calcium: 9.5 mg/dL (ref 8.6–10.2)
Chloride: 101 mmol/L (ref 96–106)
Creatinine, Ser: 1.04 mg/dL (ref 0.76–1.27)
Globulin, Total: 2.2 g/dL (ref 1.5–4.5)
Glucose: 101 mg/dL — ABNORMAL HIGH (ref 70–99)
Potassium: 4.4 mmol/L (ref 3.5–5.2)
Sodium: 137 mmol/L (ref 134–144)
Total Protein: 6.5 g/dL (ref 6.0–8.5)
eGFR: 73 mL/min/1.73 (ref >59)

## 2024-01-06 LAB — CBC WITH DIFFERENTIAL/PLATELET
Basophils Absolute: 0 x10E3/uL (ref 0.0–0.2)
Basos: 0 %
EOS (ABSOLUTE): 0.3 x10E3/uL (ref 0.0–0.4)
Eos: 2 %
Hematocrit: 34.6 % — ABNORMAL LOW (ref 37.5–51.0)
Hemoglobin: 10.9 g/dL — ABNORMAL LOW (ref 13.0–17.7)
Immature Grans (Abs): 0 x10E3/uL (ref 0.0–0.1)
Immature Granulocytes: 0 %
Lymphocytes Absolute: 1.6 x10E3/uL (ref 0.7–3.1)
Lymphs: 10 %
MCH: 29.4 pg (ref 26.6–33.0)
MCHC: 31.5 g/dL (ref 31.5–35.7)
MCV: 93 fL (ref 79–97)
Monocytes Absolute: 1.7 x10E3/uL — ABNORMAL HIGH (ref 0.1–0.9)
Monocytes: 11 %
Neutrophils Absolute: 11.6 x10E3/uL — ABNORMAL HIGH (ref 1.4–7.0)
Neutrophils: 77 %
Platelets: 203 x10E3/uL (ref 150–450)
RBC: 3.71 x10E6/uL — ABNORMAL LOW (ref 4.14–5.80)
RDW: 12.4 % (ref 11.6–15.4)
WBC: 15.3 x10E3/uL — ABNORMAL HIGH (ref 3.4–10.8)

## 2024-01-06 LAB — LIPID PANEL
Chol/HDL Ratio: 1.8 ratio (ref 0.0–5.0)
Cholesterol, Total: 126 mg/dL (ref 100–199)
HDL: 72 mg/dL (ref >39)
LDL Chol Calc (NIH): 41 mg/dL (ref 0–99)
Triglycerides: 61 mg/dL (ref 0–149)
VLDL Cholesterol Cal: 13 mg/dL (ref 5–40)

## 2024-01-06 LAB — TSH: TSH: 0.737 u[IU]/mL (ref 0.450–4.500)

## 2024-01-06 LAB — HEMOGLOBIN A1C
Est. average glucose Bld gHb Est-mCnc: 120 mg/dL
Hgb A1c MFr Bld: 5.8 % — ABNORMAL HIGH (ref 4.8–5.6)

## 2024-01-09 ENCOUNTER — Ambulatory Visit
Admission: RE | Admit: 2024-01-09 | Discharge: 2024-01-09 | Disposition: A | Discharge status: Home / Self Care | Source: Ambulatory Visit | Attending: Internal Medicine | Admitting: Internal Medicine

## 2024-01-09 ENCOUNTER — Ambulatory Visit: Admitting: Internal Medicine

## 2024-01-09 ENCOUNTER — Ambulatory Visit
Admission: RE | Admit: 2024-01-09 | Discharge: 2024-01-09 | Disposition: A | Discharge status: Home / Self Care | Attending: Internal Medicine | Admitting: Internal Medicine

## 2024-01-09 ENCOUNTER — Ambulatory Visit: Payer: Self-pay | Admitting: Internal Medicine

## 2024-01-09 VITALS — BP 130/82 | HR 78 | Temp 97.8°F | Ht 63.0 in | Wt 164.0 lb

## 2024-01-09 DIAGNOSIS — Z951 Presence of aortocoronary bypass graft: Secondary | ICD-10-CM | POA: Diagnosis not present

## 2024-01-09 DIAGNOSIS — F419 Anxiety disorder, unspecified: Secondary | ICD-10-CM

## 2024-01-09 DIAGNOSIS — D72828 Other elevated white blood cell count: Secondary | ICD-10-CM

## 2024-01-09 DIAGNOSIS — R0989 Other specified symptoms and signs involving the circulatory and respiratory systems: Secondary | ICD-10-CM | POA: Diagnosis not present

## 2024-01-09 DIAGNOSIS — R7303 Prediabetes: Secondary | ICD-10-CM

## 2024-01-09 DIAGNOSIS — R059 Cough, unspecified: Secondary | ICD-10-CM | POA: Diagnosis not present

## 2024-01-09 DIAGNOSIS — Z0130 Encounter for examination of blood pressure without abnormal findings: Secondary | ICD-10-CM

## 2024-01-09 MED ORDER — CLONAZEPAM 0.5 MG PO TABS: 0.5000 mg | ORAL_TABLET | Freq: Every day | ORAL | 2 refills | Status: DC

## 2024-01-09 NOTE — Progress Notes (Signed)
 Established Patient Office Visit  Subjective:  Patient ID: Bruce Gomez, male    DOB: 1946/03/18  Age: 78 y.o. MRN: [id number]  Chief Complaint  Patient presents with   Follow-up    3 month follow up    No new complaints, here for lab review and medication refills. CBC notable for leukocytosis and anemia of inflammation.     No other concerns at this time.   Past Medical History:  Diagnosis Date   Actinic keratosis    Anemia    Aortic sclerosis 04/21/2017   Overview:  Moderate Aortic Sclerosis, mild LVH,  mild Mitral valve thickening, Slight LAE on ECHO - started  on ACE  for  LVH   Arthritis    hips and lower back   Basal cell carcinoma 04/26/2022   Right nose - EDC   BCC (basal cell carcinoma of skin) 11/30/2023   right nose in center of [facility] treatment scar - recurrent bcc - ED&C done  recheck at next visit   BPH with obstruction/lower urinary tract symptoms 03/19/2014   BPH with obstruction/lower urinary tract symptoms 03/19/2014   CAD (coronary artery disease) 04/21/2017   Overview:   silent MI - Inferior; Dreya Buhrman/p CABG x 3; Stress Echo  6/05; 8/08;  2/12   Chronic prostatitis 11/03/2000   Coronary artery disease    Elevated PSA 03/19/2014   Erectile dysfunction 03/19/2014   History of basal cell carcinoma 07/02/2015   right nose   History of dysplastic nevus 06/22/2017   left ant deltoid   Hyperlipidemia 04/21/2017   Hypertension    Impaired glucose tolerance 04/21/2017   Myocardial infarction (HCC) 2002   OSA (obstructive sleep apnea) 04/21/2017   Overview:  rx with wt loss, side sleeping-no CPAP, not true sleep apnea   RLS (restless legs syndrome) 04/21/2017    Past Surgical History:  Procedure Laterality Date   ADENOIDECTOMY     CARDIAC SURGERY     cabg 2002   CATARACT EXTRACTION W/PHACO Right 07/26/2022   Procedure: CATARACT EXTRACTION PHACO AND INTRAOCULAR LENS PLACEMENT (IOC) RIGHT;  Surgeon: Myrna Adine Anes, MD;  Location: [facility];   Service: Ophthalmology;  Laterality: Right;  9.76 0:59.2   CATARACT EXTRACTION W/PHACO Left 08/09/2022   Procedure: CATARACT EXTRACTION PHACO AND INTRAOCULAR LENS PLACEMENT (IOC) LEFT;  Surgeon: Myrna Adine Anes, MD;  Location: [facility];  Service: Ophthalmology;  Laterality: Left;  2.59  00:25.8   COLONOSCOPY WITH PROPOFOL  N/A 12/14/2019   Procedure: COLONOSCOPY WITH PROPOFOL ;  Surgeon: Jinny Carmine, MD;  Location: [facility];  Service: Endoscopy;  Laterality: N/A;  priority 4   CORONARY ARTERY BYPASS GRAFT  2002   x3   INGUINAL HERNIA REPAIR     LEFT HEART CATH AND CORONARY ANGIOGRAPHY Left 12/15/2023   Procedure: LEFT HEART CATH AND CORONARY ANGIOGRAPHY with possible coronary intervention;  Surgeon: Fernand Denyse LABOR, MD;  Location: [facility];  Service: Cardiovascular;  Laterality: Left;   POLYPECTOMY  12/14/2019   Procedure: POLYPECTOMY;  Surgeon: Jinny Carmine, MD;  Location: [facility];  Service: Endoscopy;;   right thumb pin Right 04/2021   TRANSURETHRAL RESECTION OF PROSTATE     2014    Social History   Socioeconomic History   Marital status: Married    Spouse name: Not on file   Number of children: Not on file   Years of education: Not on file   Highest education level: Not on file  Occupational History   Not  on file  Tobacco Use   Smoking status: Never   Smokeless tobacco: Never  Vaping Use   Vaping status: Never Used  Substance and Sexual Activity   Alcohol use: Yes    Alcohol/week: 1.0 - 2.0 standard drink of alcohol    Types: 1 - 2 Glasses of wine per week    Comment: ocassional glass of wine   Drug use: No   Sexual activity: Yes    Birth control/protection: None  Other Topics Concern   Not on file  Social History Narrative   Not on file   Social Drivers of Health   Financial Resource Strain: Low Risk  (03/15/2022)   Overall Financial Resource Strain (CARDIA)    Difficulty of Paying Living Expenses: Not hard at all   Food Insecurity: No Food Insecurity (03/15/2022)   Hunger Vital Sign    Worried About Running Out of Food in the Last Year: Never true    Ran Out of Food in the Last Year: Never true  Transportation Needs: No Transportation Needs (03/15/2022)   PRAPARE - Administrator, Civil Service (Medical): No    Lack of Transportation (Non-Medical): No  Physical Activity: Sufficiently Active (03/15/2022)   Exercise Vital Sign    Days of Exercise per Week: 4 days    Minutes of Exercise per Session: 40 min  Stress: No Stress Concern Present (03/15/2022)   [employer] of Occupational Health - Occupational Stress Questionnaire    Feeling of Stress : Not at all  Social Connections: Moderately Integrated (03/15/2022)   Social Connection and Isolation Panel    Frequency of Communication with Friends and Family: More than three times a week    Frequency of Social Gatherings with Friends and Family: More than three times a week    Attends Religious Services: More than 4 times per year    Active Member of [employer] or Organizations: Not on file    Attends Banker Meetings: Never    Marital Status: Married  Catering manager Violence: Not At Risk (03/15/2022)   Humiliation, Afraid, Rape, and Kick questionnaire    Fear of Current or Ex-Partner: No    Emotionally Abused: No    Physically Abused: No    Sexually Abused: No    Family History  Problem Relation Age of Onset   Prostate cancer Paternal Uncle    Bladder Cancer Neg Hx    Kidney cancer Neg Hx     Allergies  Allergen Reactions   Pollen Extract     seasonal   Sulfa Antibiotics Other (See Comments)    Dizziness    Sulfamethoxazole-Trimethoprim     dizzy    Benadryl [Diphenhydramine Hcl (Sleep)] Anxiety    Outpatient Medications Prior to Visit  Medication Sig   acetaminophen  (TYLENOL ) 500 MG tablet Take 500 mg by mouth every 6 (six) hours as needed for moderate pain (pain score 4-6).   Ascorbic Acid (VITAMIN  C) 1000 MG tablet Take 1,000 mg by mouth daily. (Patient taking differently: Take 1,000 mg by mouth daily. 2 times a week)   aspirin  EC 81 MG tablet Take 81 mg by mouth in the morning and at bedtime.   B Complex Vitamins (B COMPLEX 1 PO) Take 1 tablet by mouth 2 (two) times a week. (Patient taking differently: Take 1 tablet by mouth 2 (two) times a week. 2 times a week)   calcium  carbonate (OSCAL) 1500 (600 Ca) MG TABS tablet Take 600 mg of elemental calcium   by mouth daily.   calcium  carbonate (TUMS - DOSED IN MG ELEMENTAL CALCIUM ) 500 MG chewable tablet Chew 1 tablet by mouth as needed for indigestion or heartburn.   carboxymethylcellulose (REFRESH PLUS) 0.5 % SOLN Place 1 drop into both eyes daily as needed (dry eyes).   Cholecalciferol (VITAMIN D3) 125 MCG (5000 UT) TABS Take 5,000 Units by mouth every other day.   Coenzyme Q10 100 MG capsule Take 200 mg by mouth 2 (two) times a week. (Patient taking differently: Take 200 mg by mouth 2 (two) times a week. 2 times a week)   diclofenac Sodium (VOLTAREN) 1 % GEL Apply 2 g topically daily as needed (pain).   esomeprazole  (NEXIUM ) 40 MG capsule Take 1 capsule (40 mg total) by mouth daily.   finasteride  (PROSCAR ) 5 MG tablet TAKE 1 TABLET EVERY DAY   folic acid  (FOLVITE ) 800 MCG tablet Take 800 mcg by mouth 2 (two) times a week.   lisinopril  (ZESTRIL ) 10 MG tablet Take 1 tablet (10 mg total) by mouth daily.   loratadine (CLARITIN) 10 MG tablet Take 10 mg by mouth daily as needed for allergies.   Melatonin 3 MG TABS Take by mouth. (Patient taking differently: Take 3 mg by mouth as needed (sleep).)   metoprolol  succinate (TOPROL -XL) 25 MG 24 hr tablet TAKE 1/2 TABLET TWICE DAILY   niacin  (VITAMIN B3) 500 MG ER tablet TAKE 4 TABLETS AT BEDTIME   Omega-3 Fatty Acids (OMEGA-3 PO) Take 1,000 mg by mouth daily.   rosuvastatin  (CRESTOR ) 40 MG tablet TAKE 1 TABLET EVERY DAY   sildenafil  (VIAGRA ) 100 MG tablet TAKE ONE TABLET BY MOUTH ONE HOUR PRIOR TO  INTERCOURSE AS NEEDED   sodium chloride  (OCEAN) 0.65 % SOLN nasal spray Place 1 spray into both nostrils at bedtime as needed for congestion.   Zinc 50 MG TABS Take 1 tablet by mouth 2 (two) times a week.   [DISCONTINUED] clonazePAM  (KLONOPIN ) 0.5 MG tablet Take 1 tablet (0.5 mg total) by mouth at bedtime.   No facility-administered medications prior to visit.    Review of Systems  Constitutional:  Positive for weight loss.  HENT: Negative.    Eyes: Negative.   Respiratory:  Positive for cough and sputum production. Negative for shortness of breath.   Gastrointestinal: Negative.   Genitourinary: Negative.   Musculoskeletal: Negative.   Skin: Negative.   Neurological: Negative.   Endo/Heme/Allergies: Negative.   Psychiatric/Behavioral: Negative.    All other systems reviewed and are negative.      Objective:   BP 130/82   Pulse 78   Temp 97.8 F (36.6 C)   Ht 5' 3 (1.6 m)   Wt 164 lb (74.4 kg)   SpO2 96%   BMI 29.05 kg/m   Vitals:   01/09/24 1507  BP: 130/82  Pulse: 78  Temp: 97.8 F (36.6 C)  Height: 5' 3 (1.6 m)  Weight: 164 lb (74.4 kg)  SpO2: 96%  BMI (Calculated): 29.06    Physical Exam Pulmonary:     Breath sounds: Examination of the right-lower field reveals rales. Rales present.      No results found for any visits on 01/09/24.  Recent Results (from the past 2160 hours)  Surgical pathology     Status: None   Collection Time: 11/29/23 12:00 AM  Result Value Ref Range   SURGICAL PATHOLOGY      SURGICAL PATHOLOGY [facility] [address] [place], [place] [address] Telephone [phone] or [phone] Fax [phone]  REPORT OF DERMATOPATHOLOGY   Accession #: [id number] Patient Name: ANTONIUS, HARTLAGE Visit # : [id number]  MRN: [id number] Cytotechnologist: Ephriam Rolla Edelman, Dermatopathologist, Electronic Signature DOB/Age Nov 02, 1945 (Age: 45) Gender: M Collected Date: 11/29/2023 Received  Date: 11/29/2023  FINAL DIAGNOSIS       1. Skin, R nose in center of BCC treatment scar :       BASAL CELL CARCINOMA WITH FIBROSIS, DEEP MARGIN INVOLVED, SEE DESCRIPTION       DATE SIGNED OUT: 11/30/2023 ELECTRONIC SIGNATURE : Depcik-Smith Md, Natalie, Dermatopathologist, Electronic Signature  MICROSCOPIC DESCRIPTION 1. There is a neoplasm composed of basaloid cells having palisading of nuclei.  These aggregates of basaloid cells are embedded in a fibrotic matrix.  In the proper setting, these changes would be compatible  with a recurrent lesion. The lesion extends to the deep margin of the specimen.  CASE COMMENTS STAINS USED IN DIAGNOSIS: H&E    CLINICAL HISTORY  SPECIMEN(Uzoma Vivona) OBTAINED 1. Skin, R Nose In [facility] Treatment Scar  SPECIMEN COMMENTS: 1. 0.4 x 0.3cm firm pap, 2-3 pieces, EDC today, check margins SPECIMEN CLINICAL INFORMATION: 1. Neoplasm of skin, scar vs recurrent BCC    Gross Description 1. Formalin fixed specimen received:  6 X 4 X 1 MM,  AGG TOTO (3 P) (1 B) ( QB )        Report signed out from the following location(Jamae Tison) . [facility] [address] [place], [place] [address] CLIA #: [id number]  [facility] [address] [place], [place] [address] CLIA #: [id number]   Comprehensive metabolic panel     Status: Abnormal   Collection Time: 12/13/23 11:03 AM  Result Value Ref Range   Glucose 99 70 - 99 mg/dL   BUN 15 8 - 27 mg/dL   Creatinine, Ser 8.67 (H) 0.76 - 1.27 mg/dL   eGFR 55 (L) >40 fO/fpw/8.26   BUN/Creatinine Ratio 11 10 - 24   Sodium 138 134 - 144 mmol/L   Potassium 4.7 3.5 - 5.2 mmol/L   Chloride 102 96 - 106 mmol/L   CO2 21 20 - 29 mmol/L   Calcium  9.7 8.6 - 10.2 mg/dL   Total Protein 6.8 6.0 - 8.5 g/dL   Albumin 4.4 3.8 - 4.8 g/dL   Globulin, Total 2.4 1.5 - 4.5 g/dL   Bilirubin Total 0.2 0.0 - 1.2 mg/dL   Alkaline Phosphatase 82 44 - 121 IU/L    Comment: **Effective December 19, 2023 Alkaline Phosphatase**   reference interval will be changing to:              Age                Male          Male           0 -  5 days         47 - 127       47 - 127           6 - 10 days         29 - 242       29 - 242          11 - 20 days        109 - 357      109 - 357          21 - 30 days  94 - 494       94 - 494           1 -  2 months      149 - 539      149 - 539           3 -  6 months      131 - 452      131 - 452           7 - 11 months      117 - 401      117 - 401   12 months -  6 years       158 - 369      158 - 369           7 - 12 years       150 - 409      150 - 409               13 years       156 - 435       78 - 227               14 years       114 - 375       64 - 161               15 years        88 - 279       56 - 134               16 years        74 - 207       51 - 121               17 years        63 - 161       47 - 113          18 - 20 years        51 - 125       42 - 106          21 - 50 years         47 - 123       41 - 116          51 - 80 years        49 - 135       51 - 125              >80 years        48 - 129       48 - 129    AST 21 0 - 40 IU/L   ALT 15 0 - 44 IU/L  CBC with Differential/Platelet     Status: Abnormal   Collection Time: 12/13/23 11:03 AM  Result Value Ref Range   WBC 10.9 (H) 3.4 - 10.8 x10E3/uL   RBC 3.53 (L) 4.14 - 5.80 x10E6/uL   Hemoglobin 10.8 (L) 13.0 - 17.7 g/dL   Hematocrit 65.8 (L) 62.4 - 51.0 %   MCV 97 79 - 97 fL   MCH 30.6 26.6 - 33.0 pg   MCHC 31.7 31.5 - 35.7 g/dL   RDW 87.7 88.3 - 84.5 %   Platelets 220 150 - 450 x10E3/uL   Neutrophils 71 Not Estab. %   Lymphs 15 Not Estab. %   Monocytes 11 Not Estab. %  Eos 3 Not Estab. %   Basos 0 Not Estab. %   Neutrophils Absolute 7.6 (H) 1.4 - 7.0 x10E3/uL   Lymphocytes Absolute 1.6 0.7 - 3.1 x10E3/uL   Monocytes Absolute 1.2 (H) 0.1 - 0.9 x10E3/uL   EOS (ABSOLUTE) 0.4 0.0 - 0.4 x10E3/uL   Basophils Absolute 0.0 0.0 - 0.2 x10E3/uL   Immature  Granulocytes 0 Not Estab. %   Immature Grans (Abs) 0.0 0.0 - 0.1 x10E3/uL  Basic metabolic panel     Status: Abnormal   Collection Time: 12/15/23  8:56 AM  Result Value Ref Range   Sodium 139 135 - 145 mmol/L   Potassium 4.0 3.5 - 5.1 mmol/L   Chloride 105 98 - 111 mmol/L   CO2 25 22 - 32 mmol/L   Glucose, Bld 119 (H) 70 - 99 mg/dL    Comment: Glucose reference range applies only to samples taken after fasting for at least 8 hours.   BUN 18 8 - 23 mg/dL   Creatinine, Ser 8.92 0.61 - 1.24 mg/dL   Calcium  9.4 8.9 - 10.3 mg/dL   GFR, Estimated >39 >39 mL/min    Comment: (NOTE) Calculated using the CKD-EPI Creatinine Equation (2021)    Anion gap 9 5 - 15    Comment: Performed at [facility], [address] [place], [place] [address]  Lipid panel     Status: None   Collection Time: 01/05/24  8:45 AM  Result Value Ref Range   Cholesterol, Total 126 100 - 199 mg/dL   Triglycerides 61 0 - 149 mg/dL   HDL 72 >60 mg/dL   VLDL Cholesterol Cal 13 5 - 40 mg/dL   LDL Chol Calc (NIH) 41 0 - 99 mg/dL   Chol/HDL Ratio 1.8 0.0 - 5.0 ratio    Comment:                                   T. Chol/HDL Ratio                                             Men  Women                               1/2 Avg.Risk  3.4    3.3                                   Avg.Risk  5.0    4.4                                2X Avg.Risk  9.6    7.1                                3X Avg.Risk 23.4   11.0   CMP14+EGFR     Status: Abnormal   Collection Time: 01/05/24  8:45 AM  Result Value Ref Range   Glucose 101 (H) 70 - 99 mg/dL   BUN 17 8 - 27 mg/dL   Creatinine, Ser 8.95 0.76 - 1.27 mg/dL   eGFR  73 >59 mL/min/1.73   BUN/Creatinine Ratio 16 10 - 24   Sodium 137 134 - 144 mmol/L   Potassium 4.4 3.5 - 5.2 mmol/L   Chloride 101 96 - 106 mmol/L   CO2 24 20 - 29 mmol/L   Calcium  9.5 8.6 - 10.2 mg/dL   Total Protein 6.5 6.0 - 8.5 g/dL   Albumin 4.3 3.8 - 4.8 g/dL   Globulin, Total 2.2 1.5 - 4.5 g/dL    Bilirubin Total 0.4 0.0 - 1.2 mg/dL   Alkaline Phosphatase 77 47 - 123 IU/L   AST 20 0 - 40 IU/L   ALT 13 0 - 44 IU/L  TSH     Status: None   Collection Time: 01/05/24  8:45 AM  Result Value Ref Range   TSH 0.737 0.450 - 4.500 uIU/mL  Hemoglobin A1c     Status: Abnormal   Collection Time: 01/05/24  8:45 AM  Result Value Ref Range   Hgb A1c MFr Bld 5.8 (H) 4.8 - 5.6 %    Comment:          Prediabetes: 5.7 - 6.4          Diabetes: >6.4          Glycemic control for adults with diabetes: <7.0    Est. average glucose Bld gHb Est-mCnc 120 mg/dL  CBC with Diff     Status: Abnormal   Collection Time: 01/05/24  8:45 AM  Result Value Ref Range   WBC 15.3 (H) 3.4 - 10.8 x10E3/uL   RBC 3.71 (L) 4.14 - 5.80 x10E6/uL   Hemoglobin 10.9 (L) 13.0 - 17.7 g/dL   Hematocrit 65.3 (L) 62.4 - 51.0 %   MCV 93 79 - 97 fL   MCH 29.4 26.6 - 33.0 pg   MCHC 31.5 31.5 - 35.7 g/dL   RDW 87.5 88.3 - 84.5 %   Platelets 203 150 - 450 x10E3/uL   Neutrophils 77 Not Estab. %   Lymphs 10 Not Estab. %   Monocytes 11 Not Estab. %   Eos 2 Not Estab. %   Basos 0 Not Estab. %   Neutrophils Absolute 11.6 (H) 1.4 - 7.0 x10E3/uL   Lymphocytes Absolute 1.6 0.7 - 3.1 x10E3/uL   Monocytes Absolute 1.7 (H) 0.1 - 0.9 x10E3/uL   EOS (ABSOLUTE) 0.3 0.0 - 0.4 x10E3/uL   Basophils Absolute 0.0 0.0 - 0.2 x10E3/uL   Immature Granulocytes 0 Not Estab. %   Immature Grans (Abs) 0.0 0.0 - 0.1 x10E3/uL      Assessment & Plan:  Huber was seen today for follow-up.  Other elevated white blood cell (WBC) count -     DG Chest 2 View; Future  Anxiety -     clonazePAM ; Take 1 tablet (0.5 mg total) by mouth at bedtime.  Dispense: 30 tablet; Refill: 2   Order atbs if CXR abnormal and schedule earlier f/u. Problem List Items Addressed This Visit       Other   Anxiety   Relevant Medications   clonazePAM  (KLONOPIN ) 0.5 MG tablet   Other Visit Diagnoses       Other elevated white blood cell (WBC) count    -  Primary    Relevant Orders   DG Chest 2 View       Return in about 3 months (around 04/10/2024) for fu with labs prior.   Total time spent: 20 minutes  Sherrill Cinderella Perry, MD  01/09/2024   This document may have been prepared by  Conservation officer, historic buildings and as such may include unintentional dictation errors.

## 2024-01-10 ENCOUNTER — Ambulatory Visit: Payer: Self-pay | Admitting: Internal Medicine

## 2024-01-11 NOTE — Progress Notes (Signed)
Pt informed

## 2024-02-13 ENCOUNTER — Ambulatory Visit (INDEPENDENT_AMBULATORY_CARE_PROVIDER_SITE_OTHER): Admitting: Cardiovascular Disease

## 2024-02-13 ENCOUNTER — Encounter: Payer: Self-pay | Admitting: Cardiovascular Disease

## 2024-02-13 VITALS — BP 112/66 | HR 83 | Ht 63.0 in | Wt 161.4 lb

## 2024-02-13 DIAGNOSIS — M19041 Primary osteoarthritis, right hand: Secondary | ICD-10-CM | POA: Diagnosis not present

## 2024-02-13 DIAGNOSIS — R0602 Shortness of breath: Secondary | ICD-10-CM | POA: Diagnosis not present

## 2024-02-13 DIAGNOSIS — I10 Essential (primary) hypertension: Secondary | ICD-10-CM | POA: Diagnosis not present

## 2024-02-13 DIAGNOSIS — Z131 Encounter for screening for diabetes mellitus: Secondary | ICD-10-CM

## 2024-02-13 DIAGNOSIS — I2510 Atherosclerotic heart disease of native coronary artery without angina pectoris: Secondary | ICD-10-CM

## 2024-02-13 DIAGNOSIS — E782 Mixed hyperlipidemia: Secondary | ICD-10-CM | POA: Diagnosis not present

## 2024-02-13 DIAGNOSIS — M11241 Other chondrocalcinosis, right hand: Secondary | ICD-10-CM | POA: Diagnosis not present

## 2024-02-13 DIAGNOSIS — S62511D Displaced fracture of proximal phalanx of right thumb, subsequent encounter for fracture with routine healing: Secondary | ICD-10-CM | POA: Diagnosis not present

## 2024-02-13 DIAGNOSIS — W1839XD Other fall on same level, subsequent encounter: Secondary | ICD-10-CM | POA: Diagnosis not present

## 2024-02-13 DIAGNOSIS — I252 Old myocardial infarction: Secondary | ICD-10-CM

## 2024-02-13 DIAGNOSIS — M1811 Unilateral primary osteoarthritis of first carpometacarpal joint, right hand: Secondary | ICD-10-CM | POA: Diagnosis not present

## 2024-02-13 NOTE — Progress Notes (Signed)
 Cardiology Office Note   Date:  02/13/2024   ID:  Bruce Gomez, DOB 1945/11/19, MRN [id number]  PCP:  Albina GORMAN Dine, MD  Cardiologist:  Denyse Bathe, MD      History of Present Illness: Bruce Gomez is a 78 y.o. male who presents for  Chief Complaint  Patient presents with   Follow-up    3 month follow up    Has joint pains.      Past Medical History:  Diagnosis Date   Actinic keratosis    Anemia    Aortic sclerosis 04/21/2017   Overview:  Moderate Aortic Sclerosis, mild LVH,  mild Mitral valve thickening, Slight LAE on ECHO - started  on ACE  for  LVH   Arthritis    hips and lower back   Basal cell carcinoma 04/26/2022   Right nose - EDC   BCC (basal cell carcinoma of skin) 11/30/2023   right nose in center of [facility] treatment scar - recurrent bcc - ED&C done  recheck at next visit   BPH with obstruction/lower urinary tract symptoms 03/19/2014   BPH with obstruction/lower urinary tract symptoms 03/19/2014   CAD (coronary artery disease) 04/21/2017   Overview:   silent MI - Inferior; s/p CABG x 3; Stress Echo  6/05; 8/08;  2/12   Chronic prostatitis 11/03/2000   Coronary artery disease    Elevated PSA 03/19/2014   Erectile dysfunction 03/19/2014   History of basal cell carcinoma 07/02/2015   right nose   History of dysplastic nevus 06/22/2017   left ant deltoid   Hyperlipidemia 04/21/2017   Hypertension    Impaired glucose tolerance 04/21/2017   Myocardial infarction (HCC) 2002   OSA (obstructive sleep apnea) 04/21/2017   Overview:  rx with wt loss, side sleeping-no CPAP, not true sleep apnea   RLS (restless legs syndrome) 04/21/2017     Past Surgical History:  Procedure Laterality Date   ADENOIDECTOMY     CARDIAC SURGERY     cabg 2002   CATARACT EXTRACTION W/PHACO Right 07/26/2022   Procedure: CATARACT EXTRACTION PHACO AND INTRAOCULAR LENS PLACEMENT (IOC) RIGHT;  Surgeon: Myrna Adine Anes, MD;  Location: [facility];  Service:  Ophthalmology;  Laterality: Right;  9.76 0:59.2   CATARACT EXTRACTION W/PHACO Left 08/09/2022   Procedure: CATARACT EXTRACTION PHACO AND INTRAOCULAR LENS PLACEMENT (IOC) LEFT;  Surgeon: Myrna Adine Anes, MD;  Location: [facility];  Service: Ophthalmology;  Laterality: Left;  2.59  00:25.8   COLONOSCOPY WITH PROPOFOL  N/A 12/14/2019   Procedure: COLONOSCOPY WITH PROPOFOL ;  Surgeon: Jinny Carmine, MD;  Location: [facility];  Service: Endoscopy;  Laterality: N/A;  priority 4   CORONARY ARTERY BYPASS GRAFT  2002   x3   INGUINAL HERNIA REPAIR     LEFT HEART CATH AND CORONARY ANGIOGRAPHY Left 12/15/2023   Procedure: LEFT HEART CATH AND CORONARY ANGIOGRAPHY with possible coronary intervention;  Surgeon: Bathe Denyse LABOR, MD;  Location: [facility];  Service: Cardiovascular;  Laterality: Left;   POLYPECTOMY  12/14/2019   Procedure: POLYPECTOMY;  Surgeon: Jinny Carmine, MD;  Location: [facility];  Service: Endoscopy;;   right thumb pin Right 04/2021   TRANSURETHRAL RESECTION OF PROSTATE     2014     Current Outpatient Medications  Medication Sig Dispense Refill   acetaminophen  (TYLENOL ) 500 MG tablet Take 500 mg by mouth every 6 (six) hours as needed for moderate pain (pain score 4-6).     Ascorbic Acid (VITAMIN C) 1000  MG tablet Take 1,000 mg by mouth daily. (Patient taking differently: Take 1,000 mg by mouth daily. 2 times a week)     aspirin  EC 81 MG tablet Take 81 mg by mouth in the morning and at bedtime.     B Complex Vitamins (B COMPLEX 1 PO) Take 1 tablet by mouth 2 (two) times a week. (Patient taking differently: Take 1 tablet by mouth 2 (two) times a week. 2 times a week)     calcium  carbonate (OSCAL) 1500 (600 Ca) MG TABS tablet Take 600 mg of elemental calcium  by mouth daily.     calcium  carbonate (TUMS - DOSED IN MG ELEMENTAL CALCIUM ) 500 MG chewable tablet Chew 1 tablet by mouth as needed for indigestion or heartburn.     carboxymethylcellulose  (REFRESH PLUS) 0.5 % SOLN Place 1 drop into both eyes daily as needed (dry eyes).     Cholecalciferol (VITAMIN D3) 125 MCG (5000 UT) TABS Take 5,000 Units by mouth every other day.     clonazePAM  (KLONOPIN ) 0.5 MG tablet Take 1 tablet (0.5 mg total) by mouth at bedtime. 30 tablet 2   Coenzyme Q10 100 MG capsule Take 200 mg by mouth 2 (two) times a week. (Patient taking differently: Take 200 mg by mouth 2 (two) times a week. 2 times a week)     diclofenac Sodium (VOLTAREN) 1 % GEL Apply 2 g topically daily as needed (pain).     esomeprazole  (NEXIUM ) 40 MG capsule Take 1 capsule (40 mg total) by mouth daily. 90 capsule 1   finasteride  (PROSCAR ) 5 MG tablet TAKE 1 TABLET EVERY DAY 90 tablet 3   folic acid  (FOLVITE ) 800 MCG tablet Take 800 mcg by mouth 2 (two) times a week.     glucosamine-chondroitin 500-400 MG tablet Take 1 tablet by mouth in the morning and at bedtime.     lisinopril  (ZESTRIL ) 10 MG tablet Take 1 tablet (10 mg total) by mouth daily. 90 tablet 1   loratadine (CLARITIN) 10 MG tablet Take 10 mg by mouth daily as needed for allergies.     Melatonin 3 MG TABS Take by mouth. (Patient taking differently: Take 3 mg by mouth as needed (sleep).)     metoprolol  succinate (TOPROL -XL) 25 MG 24 hr tablet TAKE 1/2 TABLET TWICE DAILY 90 tablet 3   niacin  (VITAMIN B3) 500 MG ER tablet TAKE 4 TABLETS AT BEDTIME 360 tablet 3   Omega-3 Fatty Acids (OMEGA-3 PO) Take 1,000 mg by mouth daily.     rosuvastatin  (CRESTOR ) 40 MG tablet TAKE 1 TABLET EVERY DAY 90 tablet 1   sildenafil  (VIAGRA ) 100 MG tablet TAKE ONE TABLET BY MOUTH ONE HOUR PRIOR TO INTERCOURSE AS NEEDED 90 tablet 1   sodium chloride  (OCEAN) 0.65 % SOLN nasal spray Place 1 spray into both nostrils at bedtime as needed for congestion.     Zinc 50 MG TABS Take 1 tablet by mouth 2 (two) times a week.     No current facility-administered medications for this visit.    Allergies:   Pollen extract, Sulfa antibiotics,  Sulfamethoxazole-trimethoprim, and Benadryl [diphenhydramine hcl (sleep)]    Social History:   reports that he has never smoked. He has never used smokeless tobacco. He reports current alcohol use of about 1.0 - 2.0 standard drink of alcohol per week. He reports that he does not use drugs.   Family History:  family history includes Prostate cancer in his paternal uncle.    ROS:  Review of Systems  Constitutional: Negative.   HENT: Negative.    Eyes: Negative.   Respiratory: Negative.    Gastrointestinal: Negative.   Genitourinary: Negative.   Musculoskeletal: Negative.   Skin: Negative.   Neurological: Negative.   Endo/Heme/Allergies: Negative.   Psychiatric/Behavioral: Negative.    All other systems reviewed and are negative.     All other systems are reviewed and negative.    PHYSICAL EXAM: VS:  BP 112/66   Pulse 83   Ht 5' 3 (1.6 m)   Wt 161 lb 6.4 oz (73.2 kg)   SpO2 99%   BMI 28.59 kg/m  , BMI Body mass index is 28.59 kg/m. Last weight:  Wt Readings from Last 3 Encounters:  02/13/24 161 lb 6.4 oz (73.2 kg)  01/09/24 164 lb (74.4 kg)  12/20/23 167 lb 12.8 oz (76.1 kg)     Physical Exam Vitals reviewed.  Constitutional:      Appearance: Normal appearance. He is normal weight.  HENT:     Head: Normocephalic.     Nose: Nose normal.     Mouth/Throat:     Mouth: Mucous membranes are moist.  Eyes:     Pupils: Pupils are equal, round, and reactive to light.  Cardiovascular:     Rate and Rhythm: Normal rate and regular rhythm.     Pulses: Normal pulses.     Heart sounds: Normal heart sounds.  Pulmonary:     Effort: Pulmonary effort is normal.  Abdominal:     General: Abdomen is flat. Bowel sounds are normal.  Musculoskeletal:        General: Normal range of motion.     Cervical back: Normal range of motion.  Skin:    General: Skin is warm.  Neurological:     General: No focal deficit present.     Mental Status: He is alert.  Psychiatric:         Mood and Affect: Mood normal.       EKG:   Recent Labs: 01/05/2024: ALT 13; BUN 17; Creatinine, Ser 1.04; Hemoglobin 10.9; Platelets 203; Potassium 4.4; Sodium 137; TSH 0.737    Lipid Panel    Component Value Date/Time   CHOL 126 01/05/2024 0845   TRIG 61 01/05/2024 0845   HDL 72 01/05/2024 0845   CHOLHDL 1.8 01/05/2024 0845   CHOLHDL 2.0 02/24/2022 0855   LDLCALC 41 01/05/2024 0845   LDLCALC 54 02/24/2022 0855      Other studies Reviewed: Additional studies/ records that were reviewed today include:  Review of the above records demonstrates:       No data to display            ASSESSMENT AND PLAN:    ICD-10-CM   1. Coronary artery disease with hx of myocardial infarct w/o hx of CABG  I25.10    I25.2    during exercise no symptoms.    2. Mixed hyperlipidemia  E78.2     3. Essential hypertension  I10     4. SOB (shortness of breath)  R06.02     5. Hx of acute myocardial infarction  I25.2        Problem List Items Addressed This Visit       Cardiovascular and Mediastinum   Essential hypertension     Other   Hyperlipidemia   Hx of acute myocardial infarction   SOB (shortness of breath)   Other Visit Diagnoses       Coronary artery disease with hx  of myocardial infarct w/o hx of CABG    -  Primary   during exercise no symptoms.          Disposition:   Return in about 3 months (around 05/15/2024).    Total time spent: 35 minutes  Signed,  Denyse Bathe, MD  02/13/2024 9:33 AM    [facility]

## 2024-03-03 ENCOUNTER — Other Ambulatory Visit: Payer: Self-pay | Admitting: Internal Medicine

## 2024-03-03 DIAGNOSIS — I10 Essential (primary) hypertension: Secondary | ICD-10-CM

## 2024-04-16 ENCOUNTER — Other Ambulatory Visit: Payer: Self-pay

## 2024-04-16 DIAGNOSIS — R972 Elevated prostate specific antigen [PSA]: Secondary | ICD-10-CM

## 2024-04-17 ENCOUNTER — Ambulatory Visit: Admitting: Dermatology

## 2024-04-17 ENCOUNTER — Other Ambulatory Visit

## 2024-04-17 ENCOUNTER — Encounter: Payer: Self-pay | Admitting: Dermatology

## 2024-04-17 DIAGNOSIS — Z85828 Personal history of other malignant neoplasm of skin: Secondary | ICD-10-CM

## 2024-04-17 DIAGNOSIS — D229 Melanocytic nevi, unspecified: Secondary | ICD-10-CM

## 2024-04-17 DIAGNOSIS — Z1283 Encounter for screening for malignant neoplasm of skin: Secondary | ICD-10-CM | POA: Diagnosis not present

## 2024-04-17 DIAGNOSIS — D492 Neoplasm of unspecified behavior of bone, soft tissue, and skin: Secondary | ICD-10-CM | POA: Diagnosis not present

## 2024-04-17 DIAGNOSIS — W908XXA Exposure to other nonionizing radiation, initial encounter: Secondary | ICD-10-CM

## 2024-04-17 DIAGNOSIS — L578 Other skin changes due to chronic exposure to nonionizing radiation: Secondary | ICD-10-CM

## 2024-04-17 DIAGNOSIS — C44311 Basal cell carcinoma of skin of nose: Secondary | ICD-10-CM | POA: Diagnosis not present

## 2024-04-17 DIAGNOSIS — L814 Other melanin hyperpigmentation: Secondary | ICD-10-CM

## 2024-04-17 DIAGNOSIS — D171 Benign lipomatous neoplasm of skin and subcutaneous tissue of trunk: Secondary | ICD-10-CM | POA: Diagnosis not present

## 2024-04-17 DIAGNOSIS — I10 Essential (primary) hypertension: Secondary | ICD-10-CM

## 2024-04-17 DIAGNOSIS — L570 Actinic keratosis: Secondary | ICD-10-CM

## 2024-04-17 DIAGNOSIS — E782 Mixed hyperlipidemia: Secondary | ICD-10-CM

## 2024-04-17 DIAGNOSIS — R7303 Prediabetes: Secondary | ICD-10-CM

## 2024-04-17 DIAGNOSIS — L821 Other seborrheic keratosis: Secondary | ICD-10-CM

## 2024-04-17 DIAGNOSIS — L905 Scar conditions and fibrosis of skin: Secondary | ICD-10-CM

## 2024-04-17 NOTE — Patient Instructions (Addendum)

## 2024-04-17 NOTE — Progress Notes (Signed)
 "  Follow-Up Visit   Subjective  Bruce Gomez is a 79 y.o. male who presents for the following: Skin Cancer Screening and Full Body Skin Exam, hx of recurrent BCC on his nose.   The patient presents for Total-Body Skin Exam (TBSE) for skin cancer screening and mole check. The patient has spots, moles and lesions to be evaluated, some may be new or changing and the patient may have concern these could be cancer.  wife is with patient and contributes to history.   The following portions of the chart were reviewed this encounter and updated as appropriate: medications, allergies, medical history  Review of Systems:  No other skin or systemic complaints except as noted in HPI or Assessment and Plan.  Objective  Well appearing patient in no apparent distress; mood and affect are within normal limits.  A full examination was performed including scalp, head, eyes, ears, nose, lips, neck, chest, axillae, abdomen, back, buttocks, bilateral upper extremities, bilateral lower extremities, hands, feet, fingers, toes, fingernails, and toenails. All findings within normal limits unless otherwise noted below.   Relevant physical exam findings are noted in the Assessment and Plan.  right earlobe Erythematous thin papules/macules with gritty scale.  R nose in center of BCC treatment scar Pink papule within a scar      Assessment & Plan   SKIN CANCER SCREENING PERFORMED TODAY.  ACTINIC DAMAGE - Chronic condition, secondary to cumulative UV/sun exposure - diffuse scaly erythematous macules with underlying dyspigmentation - Recommend daily broad spectrum sunscreen SPF 30+ to sun-exposed areas, reapply every 2 hours as needed.  - Staying in the shade or wearing long sleeves, sun glasses (UVA+UVB protection) and wide brim hats (4-inch brim around the entire circumference of the hat) are also recommended for sun protection.  - Call for new or changing lesions.  LENTIGINES, SEBORRHEIC KERATOSES,  HEMANGIOMAS - Benign normal skin lesions - Benign-appearing - Call for any changes  MELANOCYTIC NEVI - Tan-brown and/or pink-flesh-colored symmetric macules and papules - Benign appearing on exam today - Observation - Call clinic for new or changing moles - Recommend daily use of broad spectrum spf 30+ sunscreen to sun-exposed areas.   Lipoma  Exam: Subcutaneous rubbery nodule, 4 x 4.5 cm stable compared to previous visit measurements Location: left posterior base of neck Benign-appearing. Exam most consistent with a lipoma. Discussed that a lipoma is a benign fatty growth that can grow over time and sometimes get irritated. Recommend observation if it is not bothersome or changing. Discussed option of ILK injections or surgical excision to remove it if it is growing, symptomatic, or other changes noted. Please call for new or changing lesions so they can be evaluated.    HISTORY OF BASAL CELL CARCINOMA OF THE SKIN Right nose  See photo  - No evidence of recurrence  - Recommend regular full body skin exams - Recommend daily broad spectrum sunscreen SPF 30+ to sun-exposed areas, reapply every 2 hours as needed.  - Call if any new or changing lesions are noted between office visits   AK (ACTINIC KERATOSIS) right earlobe ACTINIC DAMAGE - chronic, secondary to cumulative UV radiation exposure/sun exposure over time - diffuse scaly erythematous macules with underlying dyspigmentation - Recommend daily broad spectrum sunscreen SPF 30+ to sun-exposed areas, reapply every 2 hours as needed.  - Recommend staying in the shade or wearing long sleeves, sun glasses (UVA+UVB protection) and wide brim hats (4-inch brim around the entire circumference of the hat). - Call for new or  changing lesions.  - Destruction of lesion - right earlobe Complexity: simple   Destruction method: cryotherapy   Informed consent: discussed and consent obtained   Timeout:  patient name, date of birth, surgical  site, and procedure verified Lesion destroyed using liquid nitrogen: Yes   Region frozen until ice ball extended beyond lesion: Yes   Outcome: patient tolerated procedure well with no complications   Post-procedure details: wound care instructions given    NEOPLASM OF SKIN R nose in center of [facility] treatment scar - Skin / nail biopsy Type of biopsy: tangential   Informed consent: discussed and consent obtained   Patient was prepped and draped in usual sterile fashion: area prepped with alochol. Anesthesia: the lesion was anesthetized in a standard fashion   Anesthetic:  1% lidocaine  w/ epinephrine  1-100,000 buffered w/ 8.4% NaHCO3 Instrument used: flexible razor blade   Hemostasis achieved with: pressure, aluminum chloride and electrodesiccation   Outcome: patient tolerated procedure well   Post-procedure details: wound care instructions given   Post-procedure details comment:  Ointment and small bandage  Specimen 1 - Surgical pathology Differential Diagnosis: R/O recurrent BCC  Check Margins: No May consider Mohs surgery pending biopsy results    Return in about 6 months (around 10/15/2024) for BCC on nose .  IFay Kirks, CMA, am acting as scribe for [name], MD .   Documentation: I have reviewed the above documentation for accuracy and completeness, and I agree with the above.  [name], MD    "

## 2024-04-18 LAB — CBC WITH DIFFERENTIAL/PLATELET
Basophils Absolute: 0.1 x10E3/uL (ref 0.0–0.2)
Basos: 1 %
EOS (ABSOLUTE): 0.5 x10E3/uL — ABNORMAL HIGH (ref 0.0–0.4)
Eos: 6 %
Hematocrit: 38.8 % (ref 37.5–51.0)
Hemoglobin: 12.2 g/dL — ABNORMAL LOW (ref 13.0–17.7)
Immature Grans (Abs): 0.1 x10E3/uL (ref 0.0–0.1)
Immature Granulocytes: 1 %
Lymphocytes Absolute: 1.6 x10E3/uL (ref 0.7–3.1)
Lymphs: 16 %
MCH: 29.6 pg (ref 26.6–33.0)
MCHC: 31.4 g/dL — ABNORMAL LOW (ref 31.5–35.7)
MCV: 94 fL (ref 79–97)
Monocytes Absolute: 1 x10E3/uL — ABNORMAL HIGH (ref 0.1–0.9)
Monocytes: 10 %
Neutrophils Absolute: 6.5 x10E3/uL (ref 1.4–7.0)
Neutrophils: 66 %
Platelets: 181 x10E3/uL (ref 150–450)
RBC: 4.12 x10E6/uL — ABNORMAL LOW (ref 4.14–5.80)
RDW: 14.7 % (ref 11.6–15.4)
WBC: 9.7 x10E3/uL (ref 3.4–10.8)

## 2024-04-18 LAB — CMP14+EGFR
ALT: 14 IU/L (ref 0–44)
AST: 20 IU/L (ref 0–40)
Albumin: 4.1 g/dL (ref 3.8–4.8)
Alkaline Phosphatase: 84 IU/L (ref 47–123)
BUN/Creatinine Ratio: 20 (ref 10–24)
BUN: 23 mg/dL (ref 8–27)
Bilirubin Total: 0.2 mg/dL (ref 0.0–1.2)
CO2: 21 mmol/L (ref 20–29)
Calcium: 9.4 mg/dL (ref 8.6–10.2)
Chloride: 102 mmol/L (ref 96–106)
Creatinine, Ser: 1.16 mg/dL (ref 0.76–1.27)
Globulin, Total: 2.5 g/dL (ref 1.5–4.5)
Glucose: 89 mg/dL (ref 70–99)
Potassium: 4.8 mmol/L (ref 3.5–5.2)
Sodium: 138 mmol/L (ref 134–144)
Total Protein: 6.6 g/dL (ref 6.0–8.5)
eGFR: 64 mL/min/1.73

## 2024-04-18 LAB — HEMOGLOBIN A1C
Est. average glucose Bld gHb Est-mCnc: 120 mg/dL
Hgb A1c MFr Bld: 5.8 % — ABNORMAL HIGH (ref 4.8–5.6)

## 2024-04-18 LAB — LIPID PANEL
Chol/HDL Ratio: 1.8 ratio (ref 0.0–5.0)
Cholesterol, Total: 131 mg/dL (ref 100–199)
HDL: 73 mg/dL
LDL Chol Calc (NIH): 46 mg/dL (ref 0–99)
Triglycerides: 51 mg/dL (ref 0–149)
VLDL Cholesterol Cal: 12 mg/dL (ref 5–40)

## 2024-04-18 LAB — TSH: TSH: 1.02 u[IU]/mL (ref 0.450–4.500)

## 2024-04-19 LAB — SURGICAL PATHOLOGY

## 2024-04-20 ENCOUNTER — Ambulatory Visit: Payer: Self-pay | Admitting: Dermatology

## 2024-04-20 ENCOUNTER — Ambulatory Visit: Payer: Self-pay | Admitting: Internal Medicine

## 2024-04-20 ENCOUNTER — Ambulatory Visit: Admitting: Internal Medicine

## 2024-04-20 VITALS — BP 126/78 | HR 70 | Temp 98.2°F | Ht 63.0 in | Wt 163.0 lb

## 2024-04-20 DIAGNOSIS — F419 Anxiety disorder, unspecified: Secondary | ICD-10-CM

## 2024-04-20 DIAGNOSIS — Z739 Problem related to life management difficulty, unspecified: Secondary | ICD-10-CM

## 2024-04-20 DIAGNOSIS — R7303 Prediabetes: Secondary | ICD-10-CM

## 2024-04-20 DIAGNOSIS — I2583 Coronary atherosclerosis due to lipid rich plaque: Secondary | ICD-10-CM

## 2024-04-20 DIAGNOSIS — I10 Essential (primary) hypertension: Secondary | ICD-10-CM

## 2024-04-20 DIAGNOSIS — K2100 Gastro-esophageal reflux disease with esophagitis, without bleeding: Secondary | ICD-10-CM | POA: Diagnosis not present

## 2024-04-20 DIAGNOSIS — Z0001 Encounter for general adult medical examination with abnormal findings: Secondary | ICD-10-CM | POA: Diagnosis not present

## 2024-04-20 DIAGNOSIS — I252 Old myocardial infarction: Secondary | ICD-10-CM | POA: Diagnosis not present

## 2024-04-20 DIAGNOSIS — R0602 Shortness of breath: Secondary | ICD-10-CM | POA: Diagnosis not present

## 2024-04-20 DIAGNOSIS — C44311 Basal cell carcinoma of skin of nose: Secondary | ICD-10-CM

## 2024-04-20 DIAGNOSIS — E782 Mixed hyperlipidemia: Secondary | ICD-10-CM

## 2024-04-20 DIAGNOSIS — Z1331 Encounter for screening for depression: Secondary | ICD-10-CM | POA: Diagnosis not present

## 2024-04-20 DIAGNOSIS — I2510 Atherosclerotic heart disease of native coronary artery without angina pectoris: Secondary | ICD-10-CM

## 2024-04-20 MED ORDER — ROSUVASTATIN CALCIUM 40 MG PO TABS: 40.0000 mg | ORAL_TABLET | Freq: Every day | ORAL | 1 refills | Status: DC

## 2024-04-20 MED ORDER — ESOMEPRAZOLE MAGNESIUM 40 MG PO CPDR: 40.0000 mg | DELAYED_RELEASE_CAPSULE | Freq: Every day | ORAL | 1 refills | Status: AC

## 2024-04-20 MED ORDER — CLONAZEPAM 0.5 MG PO TABS: 0.5000 mg | ORAL_TABLET | Freq: Every day | ORAL | 2 refills | Status: AC

## 2024-04-20 NOTE — Progress Notes (Signed)
 "  Established Patient Office Visit  Subjective:  Patient ID: Bruce Gomez, male    DOB: March 02, 1946  Age: 79 y.o. MRN: [id number]  Chief Complaint  Patient presents with   Annual Exam    AWV    No new complaints, here for AWV refer to quality metrics and scanned documents.   Labs reviewed and notable for well controlled lipids, improvement in hgb, unremarkable cmp and normal TSH.    No other concerns at this time.   Past Medical History:  Diagnosis Date   Actinic keratosis    Anemia    Aortic sclerosis 04/21/2017   Overview:  Moderate Aortic Sclerosis, mild LVH,  mild Mitral valve thickening, Slight LAE on ECHO - started  on ACE  for  LVH   Arthritis    hips and lower back   Basal cell carcinoma 04/26/2022   Right nose - EDC   BCC (basal cell carcinoma of skin) 11/30/2023   right nose in center of [facility] treatment scar - recurrent bcc - ED&C done  recheck at next visit   BPH with obstruction/lower urinary tract symptoms 03/19/2014   BPH with obstruction/lower urinary tract symptoms 03/19/2014   CAD (coronary artery disease) 04/21/2017   Overview:   silent MI - Inferior; Kamree Wiens/p CABG x 3; Stress Echo  6/05; 8/08;  2/12   Chronic prostatitis 11/03/2000   Coronary artery disease    Elevated PSA 03/19/2014   Erectile dysfunction 03/19/2014   History of basal cell carcinoma 07/02/2015   right nose   History of dysplastic nevus 06/22/2017   left ant deltoid   Hyperlipidemia 04/21/2017   Hypertension    Impaired glucose tolerance 04/21/2017   Myocardial infarction (HCC) 2002   OSA (obstructive sleep apnea) 04/21/2017   Overview:  rx with wt loss, side sleeping-no CPAP, not true sleep apnea   RLS (restless legs syndrome) 04/21/2017    Past Surgical History:  Procedure Laterality Date   ADENOIDECTOMY     CARDIAC SURGERY     cabg 2002   CATARACT EXTRACTION W/PHACO Right 07/26/2022   Procedure: CATARACT EXTRACTION PHACO AND INTRAOCULAR LENS PLACEMENT (IOC) RIGHT;  Surgeon:  Myrna Adine Anes, MD;  Location: [facility];  Service: Ophthalmology;  Laterality: Right;  9.76 0:59.2   CATARACT EXTRACTION W/PHACO Left 08/09/2022   Procedure: CATARACT EXTRACTION PHACO AND INTRAOCULAR LENS PLACEMENT (IOC) LEFT;  Surgeon: Myrna Adine Anes, MD;  Location: [facility];  Service: Ophthalmology;  Laterality: Left;  2.59  00:25.8   COLONOSCOPY WITH PROPOFOL  N/A 12/14/2019   Procedure: COLONOSCOPY WITH PROPOFOL ;  Surgeon: Jinny Carmine, MD;  Location: [facility];  Service: Endoscopy;  Laterality: N/A;  priority 4   CORONARY ARTERY BYPASS GRAFT  2002   x3   INGUINAL HERNIA REPAIR     LEFT HEART CATH AND CORONARY ANGIOGRAPHY Left 12/15/2023   Procedure: LEFT HEART CATH AND CORONARY ANGIOGRAPHY with possible coronary intervention;  Surgeon: Fernand Denyse LABOR, MD;  Location: [facility] INVASIVE CV LAB;  Service: Cardiovascular;  Laterality: Left;   POLYPECTOMY  12/14/2019   Procedure: POLYPECTOMY;  Surgeon: Jinny Carmine, MD;  Location: [facility];  Service: Endoscopy;;   right thumb pin Right 04/2021   TRANSURETHRAL RESECTION OF PROSTATE     2014    Social History   Socioeconomic History   Marital status: Married    Spouse name: Not on file   Number of children: Not on file   Years of education: Not on file   Highest  education level: Not on file  Occupational History   Not on file  Tobacco Use   Smoking status: Never   Smokeless tobacco: Never  Vaping Use   Vaping status: Never Used  Substance and Sexual Activity   Alcohol use: Yes    Alcohol/week: 1.0 - 2.0 standard drink of alcohol    Types: 1 - 2 Glasses of wine per week    Comment: ocassional glass of wine   Drug use: No   Sexual activity: Yes    Birth control/protection: None  Other Topics Concern   Not on file  Social History Narrative   Not on file   Social Drivers of Health   Tobacco Use: Low Risk (04/17/2024)   Patient History    Smoking Tobacco Use: Never    Smokeless  Tobacco Use: Never    Passive Exposure: Not on file  Financial Resource Strain: Low Risk (03/15/2022)   Overall Financial Resource Strain (CARDIA)    Difficulty of Paying Living Expenses: Not hard at all  Food Insecurity: No Food Insecurity (03/15/2022)   Hunger Vital Sign    Worried About Running Out of Food in the Last Year: Never true    Ran Out of Food in the Last Year: Never true  Transportation Needs: No Transportation Needs (03/15/2022)   PRAPARE - Administrator, Civil Service (Medical): No    Lack of Transportation (Non-Medical): No  Physical Activity: Sufficiently Active (03/15/2022)   Exercise Vital Sign    Days of Exercise per Week: 4 days    Minutes of Exercise per Session: 40 min  Stress: No Stress Concern Present (03/15/2022)   [employer] of Occupational Health - Occupational Stress Questionnaire    Feeling of Stress : Not at all  Social Connections: Moderately Integrated (03/15/2022)   Social Connection and Isolation Panel    Frequency of Communication with Friends and Family: More than three times a week    Frequency of Social Gatherings with Friends and Family: More than three times a week    Attends Religious Services: More than 4 times per year    Active Member of [employer] or Organizations: Not on file    Attends Banker Meetings: Never    Marital Status: Married  Catering Manager Violence: Not At Risk (03/15/2022)   Humiliation, Afraid, Rape, and Kick questionnaire    Fear of Current or Ex-Partner: No    Emotionally Abused: No    Physically Abused: No    Sexually Abused: No  Depression (PHQ2-9): Low Risk (05/19/2023)   Depression (PHQ2-9)    PHQ-2 Score: 3  Alcohol Screen: Low Risk (03/15/2022)   Alcohol Screen    Last Alcohol Screening Score (AUDIT): 1  Housing: Unknown (02/13/2024)   Received from [facility]   Epic    Unable to Pay for Housing in the Last Year: Not on file    Number of Times Moved in the  Last Year: Not on file    At any time in the past 12 months, were you homeless or living in a shelter (including now)?: No  Utilities: Not At Risk (03/18/2023)   [facility] Utilities    Threatened with loss of utilities: No  Health Literacy: Not on file    Family History  Problem Relation Age of Onset   Prostate cancer Paternal Uncle    Bladder Cancer Neg Hx    Kidney cancer Neg Hx     Allergies[1]  Show/hide medication list[2]  Review  of Systems  Constitutional:  Negative for weight loss (gained 2 lbs).  HENT: Negative.    Eyes: Negative.   Respiratory:  Negative for shortness of breath.   Gastrointestinal: Negative.   Genitourinary: Negative.   Musculoskeletal: Negative.   Skin: Negative.   Neurological: Negative.   Endo/Heme/Allergies: Negative.   Psychiatric/Behavioral: Negative.    All other systems reviewed and are negative.      Objective:   BP 126/78   Pulse 70   Temp 98.2 F (36.8 C)   Ht 5' 3 (1.6 m)   Wt 163 lb (73.9 kg)   SpO2 96%   BMI 28.87 kg/m   Vitals:   04/20/24 0941  BP: 126/78  Pulse: 70  Temp: 98.2 F (36.8 C)  Height: 5' 3 (1.6 m)  Weight: 163 lb (73.9 kg)  SpO2: 96%  BMI (Calculated): 28.88    Physical Exam Vitals reviewed.  Constitutional:      Appearance: Normal appearance. He is normal weight.  HENT:     Head: Normocephalic.     Nose: Nose normal.     Mouth/Throat:     Mouth: Mucous membranes are moist.  Eyes:     Pupils: Pupils are equal, round, and reactive to light.  Cardiovascular:     Rate and Rhythm: Normal rate and regular rhythm.     Pulses: Normal pulses.     Heart sounds: Normal heart sounds.  Pulmonary:     Effort: Pulmonary effort is normal.  Abdominal:     General: Abdomen is flat. Bowel sounds are normal.  Musculoskeletal:        General: Normal range of motion.     Cervical back: Normal range of motion.  Skin:    General: Skin is warm.  Neurological:     General: No focal deficit present.      Mental Status: He is alert.  Psychiatric:        Mood and Affect: Mood normal.      No results found for any visits on 04/20/24.      Assessment & Plan:  Bruce Gomez was seen today for annual exam.  Prediabetes -     Hemoglobin A1c  Anxiety -     clonazePAM ; Take 1 tablet (0.5 mg total) by mouth at bedtime.  Dispense: 30 tablet; Refill: 2  Gastroesophageal reflux disease with esophagitis without hemorrhage -     Esomeprazole  Magnesium ; Take 1 capsule (40 mg total) by mouth daily.  Dispense: 90 capsule; Refill: 1  Hx of acute myocardial infarction -     Rosuvastatin  Calcium ; Take 1 tablet (40 mg total) by mouth daily.  Dispense: 90 tablet; Refill: 1  Coronary artery disease due to lipid rich plaque Overview: Overview:   silent MI - Inferior; Tineshia Becraft/p CABG x 3; Stress Echo  6/05; 8/08;  2/12  Orders: -     Rosuvastatin  Calcium ; Take 1 tablet (40 mg total) by mouth daily.  Dispense: 90 tablet; Refill: 1  Essential hypertension -     Rosuvastatin  Calcium ; Take 1 tablet (40 mg total) by mouth daily.  Dispense: 90 tablet; Refill: 1 -     Comprehensive metabolic panel with GFR  Mixed hyperlipidemia -     Rosuvastatin  Calcium ; Take 1 tablet (40 mg total) by mouth daily.  Dispense: 90 tablet; Refill: 1 -     Lipid panel  SOB (shortness of breath) -     Rosuvastatin  Calcium ; Take 1 tablet (40 mg total) by mouth daily.  Dispense: 90  tablet; Refill: 1  Coronary artery disease with hx of myocardial infarct w/o hx of CABG -     Rosuvastatin  Calcium ; Take 1 tablet (40 mg total) by mouth daily.  Dispense: 90 tablet; Refill: 1    Problem List Items Addressed This Visit       Cardiovascular and Mediastinum   CAD (coronary artery disease)   Relevant Medications   rosuvastatin  (CRESTOR ) 40 MG tablet   Essential hypertension   Relevant Medications   rosuvastatin  (CRESTOR ) 40 MG tablet   Other Relevant Orders   Comprehensive metabolic panel with GFR     Other   Hyperlipidemia    Relevant Medications   rosuvastatin  (CRESTOR ) 40 MG tablet   Other Relevant Orders   Lipid panel   Hx of acute myocardial infarction   Relevant Medications   rosuvastatin  (CRESTOR ) 40 MG tablet   Prediabetes - Primary   Relevant Orders   Hemoglobin A1c   Anxiety   Relevant Medications   clonazePAM  (KLONOPIN ) 0.5 MG tablet   SOB (shortness of breath)   Relevant Medications   rosuvastatin  (CRESTOR ) 40 MG tablet   Other Visit Diagnoses       Gastroesophageal reflux disease with esophagitis without hemorrhage       Relevant Medications   esomeprazole  (NEXIUM ) 40 MG capsule     Coronary artery disease with hx of myocardial infarct w/o hx of CABG       Calcium  score was over 3000 but CTA was not done however not having chest pain.  He needs high intensity statins.   Relevant Medications   rosuvastatin  (CRESTOR ) 40 MG tablet       Return in 3 months (on 07/19/2024) for fu with labs prior.   Total time spent: 30 minutes. This time includes review of previous notes and results and patient face to face interaction during today'Kathyann Spaugh visit.    Sherrill Cinderella Perry, MD  04/20/2024   This document may have been prepared by [facility] Voice Recognition software and as such may include unintentional dictation errors.     [1]  Allergies Allergen Reactions   Pollen Extract     seasonal   Sulfa Antibiotics Other (See Comments)    Dizziness    Sulfamethoxazole-Trimethoprim     dizzy    Benadryl [Diphenhydramine Hcl (Sleep)] Anxiety  [2]  Outpatient Medications Prior to Visit  Medication Sig   acetaminophen  (TYLENOL ) 500 MG tablet Take 500 mg by mouth every 6 (six) hours as needed for moderate pain (pain score 4-6).   Ascorbic Acid (VITAMIN C) 1000 MG tablet Take 1,000 mg by mouth daily. (Patient taking differently: Take 1,000 mg by mouth daily. 2 times a week)   aspirin  EC 81 MG tablet Take 81 mg by mouth in the morning and at bedtime.   B Complex Vitamins (B COMPLEX 1 PO) Take 1  tablet by mouth 2 (two) times a week. (Patient taking differently: Take 1 tablet by mouth 2 (two) times a week. 2 times a week)   calcium  carbonate (OSCAL) 1500 (600 Ca) MG TABS tablet Take 600 mg of elemental calcium  by mouth daily.   calcium  carbonate (TUMS - DOSED IN MG ELEMENTAL CALCIUM ) 500 MG chewable tablet Chew 1 tablet by mouth as needed for indigestion or heartburn.   carboxymethylcellulose (REFRESH PLUS) 0.5 % SOLN Place 1 drop into both eyes daily as needed (dry eyes).   Cholecalciferol (VITAMIN D3) 125 MCG (5000 UT) TABS Take 5,000 Units by mouth every other day.   Coenzyme  Q10 100 MG capsule Take 200 mg by mouth 2 (two) times a week. (Patient taking differently: Take 200 mg by mouth 2 (two) times a week. 2 times a week)   diclofenac Sodium (VOLTAREN) 1 % GEL Apply 2 g topically daily as needed (pain).   finasteride  (PROSCAR ) 5 MG tablet TAKE 1 TABLET EVERY DAY   folic acid  (FOLVITE ) 800 MCG tablet Take 800 mcg by mouth 2 (two) times a week.   glucosamine-chondroitin 500-400 MG tablet Take 1 tablet by mouth in the morning and at bedtime.   lisinopril  (ZESTRIL ) 10 MG tablet TAKE 1 TABLET EVERY DAY   loratadine (CLARITIN) 10 MG tablet Take 10 mg by mouth daily as needed for allergies.   Melatonin 3 MG TABS Take by mouth. (Patient taking differently: Take 3 mg by mouth as needed (sleep).)   metoprolol  succinate (TOPROL -XL) 25 MG 24 hr tablet TAKE 1/2 TABLET TWICE DAILY   niacin  (VITAMIN B3) 500 MG ER tablet TAKE 4 TABLETS AT BEDTIME   Omega-3 Fatty Acids (OMEGA-3 PO) Take 1,000 mg by mouth daily.   sildenafil  (VIAGRA ) 100 MG tablet TAKE ONE TABLET BY MOUTH ONE HOUR PRIOR TO INTERCOURSE AS NEEDED   sodium chloride  (OCEAN) 0.65 % SOLN nasal spray Place 1 spray into both nostrils at bedtime as needed for congestion.   Zinc 50 MG TABS Take 1 tablet by mouth 2 (two) times a week.   [DISCONTINUED] clonazePAM  (KLONOPIN ) 0.5 MG tablet Take 1 tablet (0.5 mg total) by mouth at bedtime.    [DISCONTINUED] esomeprazole  (NEXIUM ) 40 MG capsule Take 1 capsule (40 mg total) by mouth daily.   [DISCONTINUED] rosuvastatin  (CRESTOR ) 40 MG tablet TAKE 1 TABLET EVERY DAY   No facility-administered medications prior to visit.   "

## 2024-04-23 ENCOUNTER — Encounter: Payer: Self-pay | Admitting: Dermatology

## 2024-04-23 NOTE — Telephone Encounter (Signed)
 Returned patients call to discuss bx results.  Advised pt of bx results and discussed [facility] surgery and available locations.  Patient prefers referral sent to Dr. Corey and [facility].  Referral sent to Dr. Janalee

## 2024-04-23 NOTE — Telephone Encounter (Addendum)
 Tried calling regarding results and to place referral. No answer. Lm for patient to return call.  ----- Message from [name], MD sent at 04/20/2024  4:32 PM EST ----- FINAL DIAGNOSIS        1. Skin, R nose in center of BCC treatment scar :       BASAL CELL CARCINOMA WITH FIBROSIS, SEE DESCRIPTION   Cancer = BCC RECURRENT Schedule for MOHS

## 2024-04-23 NOTE — Addendum Note (Signed)
 Addended by: Willena Jeancharles R on: 04/23/2024 04:08 PM   Modules accepted: Orders

## 2024-04-24 ENCOUNTER — Other Ambulatory Visit: Payer: Self-pay

## 2024-04-24 DIAGNOSIS — R972 Elevated prostate specific antigen [PSA]: Secondary | ICD-10-CM

## 2024-04-25 LAB — PSA: Prostate Specific Ag, Serum: 2.9 ng/mL (ref 0.0–4.0)

## 2024-04-27 ENCOUNTER — Encounter: Payer: Self-pay | Admitting: Urology

## 2024-04-27 ENCOUNTER — Ambulatory Visit (INDEPENDENT_AMBULATORY_CARE_PROVIDER_SITE_OTHER): Payer: Self-pay | Admitting: Urology

## 2024-04-27 DIAGNOSIS — N138 Other obstructive and reflux uropathy: Secondary | ICD-10-CM | POA: Diagnosis not present

## 2024-04-27 DIAGNOSIS — R972 Elevated prostate specific antigen [PSA]: Secondary | ICD-10-CM | POA: Diagnosis not present

## 2024-04-27 DIAGNOSIS — N401 Enlarged prostate with lower urinary tract symptoms: Secondary | ICD-10-CM

## 2024-04-27 DIAGNOSIS — N5201 Erectile dysfunction due to arterial insufficiency: Secondary | ICD-10-CM | POA: Diagnosis not present

## 2024-04-27 MED ORDER — SILDENAFIL CITRATE 100 MG PO TABS: ORAL_TABLET | ORAL | 1 refills | Status: AC

## 2024-04-27 MED ORDER — FINASTERIDE 5 MG PO TABS: ORAL_TABLET | ORAL | 3 refills | Status: AC

## 2024-04-27 NOTE — Progress Notes (Signed)
 Patient presents for an office visit. BP today is High. Greater than 140/90. Provider  notified and recheck Blood Pressure .  Pt advised to talk with PCP.  Pt voiced understanding.  BP returned to normal 131/78

## 2024-04-27 NOTE — Progress Notes (Signed)
 "  04/27/2024 10:26 AM   Bruce Gomez June 16, 1945 [id number]  Referring provider: Albina GORMAN Dine, MD [address] [place],  [place] [address]  Chief Complaint  Patient presents with   Erectile Dysfunction   Urologic History: 1.  Elevated PSA previously followed in [place].  Four prior prostate biopsies since the early 2000 with benign pathology and a baseline PSA between 11-13.   2.  BPH urinary retention in 2014; status post TURP by Dr. Veverly with an incomplete resection.  He had resection of a large intravesical median lobe with 26 g resected. Started finasteride  05/20/2021   3.  Erectile dysfunction On sildenafil   HPI: Bruce Gomez is a 79 y.o. male who presents for annual follow-up.  No significant changes since last year's visit No bothersome LUTS; notes occasional split stream which has been present ever since his TURP He was placed on niacin  by PCP and has noted improved efficacy of sildenafil  if he takes the niacin  before taking the sildenafil  PSA stable at 2.9 (uncorrected)   PMH: Past Medical History:  Diagnosis Date   Actinic keratosis    Anemia    Aortic sclerosis 04/21/2017   Overview:  Moderate Aortic Sclerosis, mild LVH,  mild Mitral valve thickening, Slight LAE on ECHO - started  on ACE  for  LVH   Arthritis    hips and lower back   Basal cell carcinoma 04/26/2022   Right nose - EDC   BCC (basal cell carcinoma of skin) 11/30/2023   right nose in center of [facility] treatment scar - recurrent bcc - ED&C done  recheck at next visit   BCC (basal cell carcinoma of skin) 04/17/2024   right nose in center of bcc treatment scar - recurrent - [facility] referral sent to Dr. Corey   BPH with obstruction/lower urinary tract symptoms 03/19/2014   BPH with obstruction/lower urinary tract symptoms 03/19/2014   CAD (coronary artery disease) 04/21/2017   Overview:   silent MI - Inferior; s/p CABG x 3; Stress Echo  6/05; 8/08;  2/12   Chronic prostatitis 11/03/2000    Coronary artery disease    Elevated PSA 03/19/2014   Erectile dysfunction 03/19/2014   History of basal cell carcinoma 07/02/2015   right nose   History of dysplastic nevus 06/22/2017   left ant deltoid   Hyperlipidemia 04/21/2017   Hypertension    Impaired glucose tolerance 04/21/2017   Myocardial infarction (HCC) 2002   OSA (obstructive sleep apnea) 04/21/2017   Overview:  rx with wt loss, side sleeping-no CPAP, not true sleep apnea   RLS (restless legs syndrome) 04/21/2017    Surgical History: Past Surgical History:  Procedure Laterality Date   ADENOIDECTOMY     CARDIAC SURGERY     cabg 2002   CATARACT EXTRACTION W/PHACO Right 07/26/2022   Procedure: CATARACT EXTRACTION PHACO AND INTRAOCULAR LENS PLACEMENT (IOC) RIGHT;  Surgeon: Myrna Adine Anes, MD;  Location: [facility];  Service: Ophthalmology;  Laterality: Right;  9.76 0:59.2   CATARACT EXTRACTION W/PHACO Left 08/09/2022   Procedure: CATARACT EXTRACTION PHACO AND INTRAOCULAR LENS PLACEMENT (IOC) LEFT;  Surgeon: Myrna Adine Anes, MD;  Location: [facility];  Service: Ophthalmology;  Laterality: Left;  2.59  00:25.8   COLONOSCOPY WITH PROPOFOL  N/A 12/14/2019   Procedure: COLONOSCOPY WITH PROPOFOL ;  Surgeon: Jinny Carmine, MD;  Location: [facility];  Service: Endoscopy;  Laterality: N/A;  priority 4   CORONARY ARTERY BYPASS GRAFT  2002   x3   INGUINAL HERNIA REPAIR  LEFT HEART CATH AND CORONARY ANGIOGRAPHY Left 12/15/2023   Procedure: LEFT HEART CATH AND CORONARY ANGIOGRAPHY with possible coronary intervention;  Surgeon: Fernand Denyse LABOR, MD;  Location: [facility] INVASIVE CV LAB;  Service: Cardiovascular;  Laterality: Left;   POLYPECTOMY  12/14/2019   Procedure: POLYPECTOMY;  Surgeon: Jinny Carmine, MD;  Location: [facility];  Service: Endoscopy;;   right thumb pin Right 04/2021   TRANSURETHRAL RESECTION OF PROSTATE     2014    Home Medications:  Allergies as of 04/27/2024        Reactions   Pollen Extract    seasonal   Sulfa Antibiotics Other (See Comments)   Dizziness   Sulfamethoxazole-trimethoprim    dizzy   Benadryl [diphenhydramine Hcl (sleep)] Anxiety        Medication List        Accurate as of April 27, 2024 10:26 AM. If you have any questions, ask your nurse or doctor.          acetaminophen  500 MG tablet Commonly known as: TYLENOL  Take 500 mg by mouth every 6 (six) hours as needed for moderate pain (pain score 4-6).   aspirin  EC 81 MG tablet Take 81 mg by mouth in the morning and at bedtime.   B COMPLEX 1 PO Take 1 tablet by mouth 2 (two) times a week. What changed: additional instructions   calcium  carbonate 1500 (600 Ca) MG Tabs tablet Commonly known as: OSCAL Take 600 mg of elemental calcium  by mouth daily.   calcium  carbonate 500 MG chewable tablet Commonly known as: TUMS - dosed in mg elemental calcium  Chew 1 tablet by mouth as needed for indigestion or heartburn.   carboxymethylcellulose 0.5 % Soln Commonly known as: REFRESH PLUS Place 1 drop into both eyes daily as needed (dry eyes).   clonazePAM  0.5 MG tablet Commonly known as: KLONOPIN  Take 1 tablet (0.5 mg total) by mouth at bedtime.   Coenzyme Q10 100 MG capsule Take 200 mg by mouth 2 (two) times a week. What changed: additional instructions   esomeprazole  40 MG capsule Commonly known as: NEXIUM  Take 1 capsule (40 mg total) by mouth daily.   finasteride  5 MG tablet Commonly known as: PROSCAR  TAKE 1 TABLET EVERY DAY   folic acid  800 MCG tablet Commonly known as: FOLVITE  Take 800 mcg by mouth 2 (two) times a week.   glucosamine-chondroitin 500-400 MG tablet Take 1 tablet by mouth in the morning and at bedtime.   lisinopril  10 MG tablet Commonly known as: ZESTRIL  TAKE 1 TABLET EVERY DAY   loratadine 10 MG tablet Commonly known as: CLARITIN Take 10 mg by mouth daily as needed for allergies.   melatonin 3 MG Tabs tablet Take by mouth. What  changed:  how much to take when to take this reasons to take this   metoprolol  succinate 25 MG 24 hr tablet Commonly known as: TOPROL -XL TAKE 1/2 TABLET TWICE DAILY   niacin  500 MG ER tablet Commonly known as: VITAMIN B3 TAKE 4 TABLETS AT BEDTIME   OMEGA-3 PO Take 1,000 mg by mouth daily.   rosuvastatin  40 MG tablet Commonly known as: CRESTOR  Take 1 tablet (40 mg total) by mouth daily.   sildenafil  100 MG tablet Commonly known as: VIAGRA  TAKE ONE TABLET BY MOUTH ONE HOUR PRIOR TO INTERCOURSE AS NEEDED   sodium chloride  0.65 % Soln nasal spray Commonly known as: OCEAN Place 1 spray into both nostrils at bedtime as needed for congestion.   vitamin C 1000 MG  tablet Take 1,000 mg by mouth daily. What changed: additional instructions   Vitamin D3 125 MCG (5000 UT) Tabs Take 5,000 Units by mouth every other day.   Voltaren 1 % Gel Generic drug: diclofenac Sodium Apply 2 g topically daily as needed (pain).   Zinc 50 MG Tabs Take 1 tablet by mouth 2 (two) times a week.        Allergies: Allergies[1]  Family History: Family History  Problem Relation Age of Onset   Prostate cancer Paternal Uncle    Bladder Cancer Neg Hx    Kidney cancer Neg Hx     Social History:  reports that he has never smoked. He has never used smokeless tobacco. He reports current alcohol use of about 1.0 - 2.0 standard drink of alcohol per week. He reports that he does not use drugs.   Physical Exam: BP (!) 161/77   Pulse 75   Ht 5' 3 (1.6 m)   Wt 155 lb (70.3 kg)   SpO2 98%   BMI 27.46 kg/m   Constitutional:  Alert, No acute distress. HEENT: [place] AT Respiratory: Normal respiratory effort, no increased work of breathing. Psychiatric: Normal mood and affect.   Assessment & Plan:    1.  BPH with LUTS Status post TUR median lobe Remains on finasteride  which was refilled  2.  Erectile dysfunction Stable Sildenafil  refilled  3.  Elevated PSA Present corrected PSA significantly  below prior baseline We discussed the incidence of significant prostate cancer is low in men in his age group and recommend discontinuing PSA checks He has requested to continue annual checks   Bruce JAYSON Barba, MD  [facility] [address] [place], [place] [address] [phone]     [1]  Allergies Allergen Reactions   Pollen Extract     seasonal   Sulfa Antibiotics Other (See Comments)    Dizziness    Sulfamethoxazole-Trimethoprim     dizzy    Benadryl [Diphenhydramine Hcl (Sleep)] Anxiety   "

## 2024-04-28 ENCOUNTER — Other Ambulatory Visit: Payer: Self-pay | Admitting: Internal Medicine

## 2024-04-28 DIAGNOSIS — I10 Essential (primary) hypertension: Secondary | ICD-10-CM

## 2024-04-28 DIAGNOSIS — E782 Mixed hyperlipidemia: Secondary | ICD-10-CM

## 2024-04-28 DIAGNOSIS — R0602 Shortness of breath: Secondary | ICD-10-CM

## 2024-04-28 DIAGNOSIS — I2510 Atherosclerotic heart disease of native coronary artery without angina pectoris: Secondary | ICD-10-CM

## 2024-04-28 DIAGNOSIS — I252 Old myocardial infarction: Secondary | ICD-10-CM

## 2024-05-02 ENCOUNTER — Other Ambulatory Visit: Payer: Self-pay

## 2024-05-02 DIAGNOSIS — I2510 Atherosclerotic heart disease of native coronary artery without angina pectoris: Secondary | ICD-10-CM

## 2024-05-02 DIAGNOSIS — E782 Mixed hyperlipidemia: Secondary | ICD-10-CM

## 2024-05-02 DIAGNOSIS — I252 Old myocardial infarction: Secondary | ICD-10-CM

## 2024-05-02 DIAGNOSIS — R0602 Shortness of breath: Secondary | ICD-10-CM

## 2024-05-02 DIAGNOSIS — I10 Essential (primary) hypertension: Secondary | ICD-10-CM

## 2024-05-04 MED ORDER — ROSUVASTATIN CALCIUM 40 MG PO TABS: 40.0000 mg | ORAL_TABLET | Freq: Every day | ORAL | 1 refills | Status: AC

## 2024-05-08 ENCOUNTER — Encounter: Payer: Self-pay | Admitting: Dermatology

## 2024-05-09 ENCOUNTER — Ambulatory Visit: Admitting: Dermatology

## 2024-05-09 ENCOUNTER — Encounter: Payer: Self-pay | Admitting: Dermatology

## 2024-05-09 VITALS — BP 142/67 | HR 81 | Temp 97.9°F

## 2024-05-09 DIAGNOSIS — C44311 Basal cell carcinoma of skin of nose: Secondary | ICD-10-CM

## 2024-05-09 DIAGNOSIS — L814 Other melanin hyperpigmentation: Secondary | ICD-10-CM | POA: Diagnosis not present

## 2024-05-09 DIAGNOSIS — L578 Other skin changes due to chronic exposure to nonionizing radiation: Secondary | ICD-10-CM | POA: Diagnosis not present

## 2024-05-09 DIAGNOSIS — I781 Nevus, non-neoplastic: Secondary | ICD-10-CM

## 2024-05-09 DIAGNOSIS — C4491 Basal cell carcinoma of skin, unspecified: Secondary | ICD-10-CM

## 2024-05-09 MED ORDER — MUPIROCIN 2 % EX OINT: 1.0000 | TOPICAL_OINTMENT | Freq: Two times a day (BID) | CUTANEOUS | 1 refills | Status: AC

## 2024-05-09 MED ORDER — TRAMADOL HCL 50 MG PO TABS: 50.0000 mg | ORAL_TABLET | Freq: Four times a day (QID) | ORAL | 0 refills | Status: DC | PRN

## 2024-05-09 MED ORDER — TRAMADOL HCL 50 MG PO TABS: 50.0000 mg | ORAL_TABLET | Freq: Four times a day (QID) | ORAL | 0 refills | Status: AC | PRN

## 2024-05-09 NOTE — Patient Instructions (Signed)

## 2024-05-09 NOTE — Progress Notes (Signed)
 "  Follow-Up Visit   Subjective  Bruce Gomez is a 79 y.o. male who presents for the following: Mohs of recurrent Fibrotic/sclerotic BCC on the right nasal dorsum/sidewall, referred by Dr. Hester.   The following portions of the chart were reviewed this encounter and updated as appropriate: medications, allergies, medical history  Review of Systems:  No other skin or systemic complaints except as noted in HPI or Assessment and Plan.  Objective  Well appearing patient in no apparent distress; mood and affect are within normal limits.  A focused examination was performed of the following areas: Right nose in center off [facility] treatment scar Relevant physical exam findings are noted in the Assessment and Plan.   Right nose in center of BCC treatment scar Pink papule adjacent scar   Assessment & Plan   BASAL CELL CARCINOMA (BCC), UNSPECIFIED SITE Right nose in center of [facility] treatment scar - Mohs surgery  Consent obtained: written  Anticoagulation: Was the anticoagulation regimen changed prior to Mohs? No    Anesthesia: Anesthesia method: local infiltration Local anesthetic: lidocaine  1% WITH epi  Procedure Details: Timeout: pre-procedure verification complete Procedure Prep: patient was prepped and draped in usual sterile fashion Prep type: chlorhexidine Biopsy accession number: [id number] Pre-Op diagnosis: basal cell carcinoma BCC subtype: sclerosing MohsAIQ Surgical site (if tumor spans multiple areas, please select predominant area): nose Surgery side: right Surgical site (from skin exam): Right nose in center of BCC treatment scar Pre-operative length (cm): 1.3 Pre-operative width (cm): 1.1 Indications for Mohs surgery: anatomic location where tissue conservation is critical and recurrence  Micrographic Surgery Details: Post-operative length (cm): 2.2 Post-operative width (cm): 1.7 Number of Mohs stages: 2 Post surgery depth of defect: subcutaneous  fat  Stage 1    Tumor features identified on Mohs section: basal carcinoma    Depth of tumor invasion after stage: dermis  Stage 2    Tumor features identified on Mohs section: no tumor identified  Reconstruction: Was the defect reconstructed? Yes   Was reconstruction performed by the same Mohs surgeon? Yes   Setting of reconstruction: outpatient office When was reconstruction performed? same day Type of reconstruction: flap Type of flap: rotation    This Visit - traMADol  (ULTRAM ) 50 MG tablet - Take 1 tablet (50 mg total) by mouth every 6 (six) hours as needed for up to 8 doses. SPIDER ANGIOMA    Spider Angioma- right distal nasal tip - Reassured on benign nature - Follow up with Dr. MARLA  Return in about 4 weeks (around 06/06/2024) for wound check, 7-11 days wound check/suture removal.  I, Darice Smock, CMA, am acting as scribe for RUFUS CHRISTELLA HOLY, MD.    05/09/2024  HISTORY OF PRESENT ILLNESS  Bruce Gomez is seen in consultation at the request of Dr. Hester for biopsy-proven Sclerotic/Fibrotic Recurrent Basal Cell Carcinoma on the right nasal sidewall/dorsum. They note that the area has been present for about 3 years increasing in size with time.  Previously treated with EDC. Reports no other new or changing lesions and has no other complaints today.  Medications and allergies: see patient chart.  Review of systems: Reviewed 8 systems and notable for the above skin cancer.  All other systems reviewed are unremarkable/negative, unless noted in the HPI. Past medical history, surgical history, family history, social history were also reviewed and are noted in the chart/questionnaire.    PHYSICAL EXAMINATION  General: Well-appearing, in no acute distress, alert and oriented x 4. Vitals reviewed in chart (if available).  Skin: Exam reveals a 1.3 x 1.1 cm erythematous papule and biopsy scar on the right nasal dorsum/sidewall. There are rhytids, telangiectasias, and lentigines,  consistent with photodamage.  Biopsy report(s) reviewed, confirming the diagnosis.   ASSESSMENT  1) Sclerotic/Fibrotic Recurrent Basal Cell Carcinoma on the right nasal sidewall/dorsum 2) photodamage 3) solar lentigines   PLAN   1. Due to location, size, histology, or recurrence and the likelihood of subclinical extension as well as the need to conserve normal surrounding tissue, the patient was deemed acceptable for Mohs micrographic surgery (MMS).  The nature and purpose of the procedure, associated benefits and risks including recurrence and scarring, possible complications such as pain, infection, and bleeding, and alternative methods of treatment if appropriate were discussed with the patient during consent. The lesion location was verified by the patient, by reviewing previous notes, pathology reports, and by photographs as well as angulation measurements if available.  Informed consent was reviewed and signed by the patient, and timeout was performed at 8:30 AM. See op note below.  2. For the photodamage and solar lentigines, sun protection discussed/information given on OTC sunscreens, and we recommend continued regular follow-up with primary dermatologist every 6 months or sooner for any growing, bleeding, or changing lesions. 3. Prognosis and future surveillance discussed. 4. Letter with treatment outcome sent to referring provider. 5. Pain acetaminophen /ibuprofen/tramadol  50 mg   MOHS MICROGRAPHIC SURGERY AND RECONSTRUCTION  Initial size:   1.3 x 1.1 cm Surgical defect/wound size: 2.2 x 1.7 cm Anesthesia:    0.33% lidocaine  with 1:200,000 epinephrine  EBL:    <5 mL Complications:  None Repair type:   Adjacent Tissue Transfer (Rotation Flap) SQ suture:   5-0 Monocryl Cutaneous suture:  5-0 Polyprolene Final size of the repair: 5.2 x 3.0 cm  Stages: 2  STAGE I: Anesthesia achieved with 0.5% lidocaine  with 1:200,000 epinephrine . ChloraPrep applied. 1 section(s) excised using  Mohs technique (this includes total peripheral and deep tissue margin excision and evaluation with frozen sections, excised and interpreted by the same physician). The tumor was first debulked and then excised with an approx. 2 mm margin.  Hemostasis was achieved with electrocautery as needed.  The specimen was then oriented, subdivided/relaxed, inked, and processed using Mohs technique.    Frozen section analysis revealed a positive margin for islands of cells with peripheral palisading and a haphazard arrangement of the more central cells in the peripheral margin.    STAGE II: An additional 2 mm margin was excised.  Hemostasis was achieved with electrocautery as needed.  The specimen was then oriented, subdivided/relaxed, inked, and processed using Mohs technique. Evaluation of slides by the Mohs surgeon revealed clear tumor margins.  Reconstruction  PROCEDURE: Rotation Flap The nature of the procedure was discussed with the patient in detail, including alternatives.  The risks discussed included but not limited to potential for infection, bleeding, scar formation, and damage to underlying structures.  The patient understood the risks and signed the consent form (scanned into chart).  The wound was reconstructed with a rotation flap.  Local anesthesia was achieved with the anesthetic indicated above.  The operative site was prepped with a surgical antiseptic solution, and then draped with sterile towels to insure a sterile field.  The beveled edges of the wound were excised to 90 degrees relative to the surrounding skin.  The wound was undermined in all directions.  A curvilinear incision was made from the apex of the wound and extended in a direction to use relaxed skin tension lines and  anatomic boundaries to greatest advantage.  The flap and surrounding tissue were undermined in the appropriate plane, being careful not to compromise the vascular supply of the flap.  Meticulous hemostasis was obtained  with an electrosurgical device.  The flap was then rotated into the defect and the key suture was placed.  The secondary defect was then sutured in a layered fashion, and any standing cone was excised and sutured closed.  The flap tip was trimmed, as necessary, and sutured into place to fit precisely into the defect. The dimensions of the flap were:  5.2 cm x 3.0 cm for a total flap surface area of 15.6 centimeters squared (cm2).     A sterile non-stick pressure dressing was applied, the wound care instruction handout was reviewed with the patient, and appropriate follow-up care was scheduled. The patient understands the need to return immediately for any signs of infection to include swelling, pain, purulent discharge, localized warmth, or fever.  Contact information was provided to the patient (including after-hours pager numbers).   Documentation: I have reviewed the above documentation for accuracy and completeness, and I agree with the above.  RUFUS CHRISTELLA HOLY, MD  "

## 2024-05-15 ENCOUNTER — Ambulatory Visit: Admitting: Cardiovascular Disease

## 2024-05-21 ENCOUNTER — Ambulatory Visit: Admitting: Dermatology

## 2024-08-01 ENCOUNTER — Ambulatory Visit: Admitting: Internal Medicine

## 2024-10-16 ENCOUNTER — Ambulatory Visit: Admitting: Dermatology

## 2025-04-22 ENCOUNTER — Other Ambulatory Visit

## 2025-04-26 ENCOUNTER — Ambulatory Visit: Admitting: Urology
